# Patient Record
Sex: Female | Born: 1985 | Race: White | Hispanic: No | Smoking: Never smoker
Health system: Southern US, Community
[De-identification: ages and names within clinical notes are randomized; demographics above are authoritative.]

## PROBLEM LIST (undated history)

## (undated) ENCOUNTER — Inpatient Hospital Stay (HOSPITAL_COMMUNITY): Payer: Self-pay

## (undated) ENCOUNTER — Emergency Department (HOSPITAL_BASED_OUTPATIENT_CLINIC_OR_DEPARTMENT_OTHER): Discharge status: Absent without leave | Payer: No Typology Code available for payment source

## (undated) DIAGNOSIS — E88819 Insulin resistance, unspecified: Secondary | ICD-10-CM

## (undated) DIAGNOSIS — J45909 Unspecified asthma, uncomplicated: Secondary | ICD-10-CM

## (undated) DIAGNOSIS — L509 Urticaria, unspecified: Secondary | ICD-10-CM

## (undated) DIAGNOSIS — E8881 Metabolic syndrome: Secondary | ICD-10-CM

## (undated) DIAGNOSIS — I10 Essential (primary) hypertension: Secondary | ICD-10-CM

## (undated) DIAGNOSIS — L309 Dermatitis, unspecified: Secondary | ICD-10-CM

## (undated) DIAGNOSIS — J189 Pneumonia, unspecified organism: Secondary | ICD-10-CM

## (undated) DIAGNOSIS — L659 Nonscarring hair loss, unspecified: Secondary | ICD-10-CM

## (undated) DIAGNOSIS — L80 Vitiligo: Secondary | ICD-10-CM

## (undated) DIAGNOSIS — R091 Pleurisy: Secondary | ICD-10-CM

## (undated) HISTORY — DX: Urticaria, unspecified: L50.9

## (undated) HISTORY — DX: Nonscarring hair loss, unspecified: L65.9

## (undated) HISTORY — DX: Vitiligo: L80

## (undated) HISTORY — DX: Insulin resistance, unspecified: E88.819

## (undated) HISTORY — DX: Metabolic syndrome: E88.81

## (undated) HISTORY — DX: Essential (primary) hypertension: I10

## (undated) HISTORY — DX: Dermatitis, unspecified: L30.9

---

## 2004-03-04 ENCOUNTER — Other Ambulatory Visit: Admission: RE | Admit: 2004-03-04 | Discharge: 2004-03-04 | Payer: Self-pay | Admitting: Gynecology

## 2005-03-10 ENCOUNTER — Other Ambulatory Visit: Admission: RE | Admit: 2005-03-10 | Discharge: 2005-03-10 | Payer: Self-pay | Admitting: Gynecology

## 2006-01-16 ENCOUNTER — Emergency Department (HOSPITAL_COMMUNITY): Admission: EM | Admit: 2006-01-16 | Discharge: 2006-01-17 | Payer: Self-pay | Admitting: Emergency Medicine

## 2006-01-19 ENCOUNTER — Encounter: Admission: RE | Admit: 2006-01-19 | Discharge: 2006-01-19 | Payer: Self-pay | Admitting: Emergency Medicine

## 2006-03-15 ENCOUNTER — Other Ambulatory Visit: Admission: RE | Admit: 2006-03-15 | Discharge: 2006-03-15 | Payer: Self-pay | Admitting: Gynecology

## 2006-11-09 HISTORY — PX: WISDOM TOOTH EXTRACTION: SHX21

## 2007-03-24 ENCOUNTER — Other Ambulatory Visit: Admission: RE | Admit: 2007-03-24 | Discharge: 2007-03-24 | Payer: Self-pay | Admitting: Gynecology

## 2007-11-26 ENCOUNTER — Emergency Department (HOSPITAL_COMMUNITY): Admission: EM | Admit: 2007-11-26 | Discharge: 2007-11-26 | Payer: Self-pay | Admitting: Emergency Medicine

## 2011-07-30 LAB — I-STAT 8, (EC8 V) (CONVERTED LAB)
BUN: 18
Sodium: 138
pCO2, Ven: 45.9
pH, Ven: 7.364 — ABNORMAL HIGH

## 2012-06-18 ENCOUNTER — Emergency Department (HOSPITAL_COMMUNITY)
Admission: EM | Admit: 2012-06-18 | Discharge: 2012-06-18 | Disposition: A | Discharge status: Home / Self Care | Payer: 59 | Source: Home / Self Care | Attending: Emergency Medicine | Admitting: Emergency Medicine

## 2012-06-18 ENCOUNTER — Emergency Department (INDEPENDENT_AMBULATORY_CARE_PROVIDER_SITE_OTHER): Payer: 59

## 2012-06-18 ENCOUNTER — Encounter (HOSPITAL_COMMUNITY): Payer: Self-pay | Admitting: *Deleted

## 2012-06-18 DIAGNOSIS — R062 Wheezing: Secondary | ICD-10-CM

## 2012-06-18 DIAGNOSIS — M546 Pain in thoracic spine: Secondary | ICD-10-CM

## 2012-06-18 HISTORY — DX: Pleurisy: R09.1

## 2012-06-18 HISTORY — DX: Pneumonia, unspecified organism: J18.9

## 2012-06-18 HISTORY — DX: Unspecified asthma, uncomplicated: J45.909

## 2012-06-18 LAB — POCT PREGNANCY, URINE: Preg Test, Ur: NEGATIVE

## 2012-06-18 LAB — POCT URINALYSIS DIP (DEVICE)
Glucose, UA: NEGATIVE mg/dL
Nitrite: NEGATIVE
Protein, ur: NEGATIVE mg/dL
Urobilinogen, UA: 0.2 mg/dL (ref 0.0–1.0)

## 2012-06-18 MED ORDER — ALBUTEROL SULFATE HFA 108 (90 BASE) MCG/ACT IN AERS: 1.0000 | INHALATION_SPRAY | Freq: Four times a day (QID) | RESPIRATORY_TRACT | Status: DC | PRN

## 2012-06-18 MED ORDER — HYDROCODONE-ACETAMINOPHEN 5-325 MG PO TABS: 2.0000 | ORAL_TABLET | ORAL | Status: AC | PRN

## 2012-06-18 MED ORDER — MELOXICAM 15 MG PO TABS: 15.0000 mg | ORAL_TABLET | Freq: Every day | ORAL | Status: AC

## 2012-06-18 MED ORDER — PREDNISONE (PAK) 10 MG PO TABS: ORAL_TABLET | ORAL | Status: AC

## 2012-06-18 NOTE — ED Provider Notes (Signed)
History     CSN: [id number]  Arrival date & time 06/18/12  1704   First MD Initiated Contact with Patient 06/18/12 1749      Chief Complaint  Patient presents with  . Back Pain    (Consider location/radiation/quality/duration/timing/severity/associated sxs/prior treatment) HPI Comments: Patient reports right-sided posterior mid thoracic back pain described as pins and needles, which is worse with standing for prolonged period of time, especially after a 12 hour shift. 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 No coughing, wheezing, chest pain, shortness of breath, fevers, hemoptysis. No pleuritic chest pain. No rash. No other back pain. No extremity weakness, paresthesias. No abdominal pain, urinary complaints. No recent or remote history of trauma to her back. She has a history of asthma as a child, atypical pneumonia x2. She is not a smoker.  ROS as noted in HPI. All other ROS negative.   Patient is a 26 y.o. female presenting with back pain. The history is provided by the patient. No language interpreter was used.  Back Pain  This is a new problem. The current episode started more than 1 week ago. The problem occurs daily. The problem has been gradually worsening. The pain is present in the thoracic spine. The quality of the pain is described as burning. The pain does not radiate. The symptoms are aggravated by bending and twisting. The pain is worse during the day. Associated symptoms include paresthesias. Pertinent negatives include no chest pain, no fever, no paresis and no weakness. She has tried NSAIDs for the symptoms. The treatment provided mild relief. Risk factors include obesity.    Past Medical History  Diagnosis Date  . Asthma   . PNA (pneumonia)   . Pleurisy     History reviewed. No pertinent past surgical history.  Family History  Problem Relation Age of Onset  .  Hypertension Mother   . Diabetes Father   . Hypertension Father   . Coronary artery disease Father     History  Substance Use Topics  . Smoking status: Never Smoker   . Smokeless tobacco: Not on file  . Alcohol Use: No    OB History    Grav Para Term Preterm Abortions TAB SAB Ect Mult Living                  Review of Systems  Constitutional: Negative for fever.  Cardiovascular: Negative for chest pain.  Musculoskeletal: Positive for back pain.  Neurological: Positive for paresthesias. Negative for weakness.    Allergies  Keflex  Home Medications   Current Outpatient Rx  Name Route Sig Dispense Refill  . BIOTIN PO Oral Take by mouth.    . MULTI-VITAMIN/MINERALS PO TABS Oral Take 1 tablet by mouth daily.    Marland Kitchen NORGESTIM-ETH ESTRAD TRIPHASIC 0.18/0.215/0.25 MG-25 MCG PO TABS Oral Take 1 tablet by mouth daily.    . ALBUTEROL SULFATE HFA 108 (90 BASE) MCG/ACT IN AERS Inhalation Inhale 1-2 puffs into the lungs every 6 (six) hours as needed for wheezing. Dispense with aerochamber 1 Inhaler 0  . HYDROCODONE-ACETAMINOPHEN 5-325 MG PO TABS Oral Take 2 tablets by mouth every 4 (four) hours as needed for pain. 20 tablet 0  . MELOXICAM 15 MG PO TABS Oral Take 1 tablet (15 mg total) by mouth daily. 14 tablet 0  . PREDNISONE (PAK) 10 MG PO TABS  Dispense one  6 day pack. Take as directed with food. 21 tablet 0    BP 135/84  Pulse 90  Temp 98.3 F (36.8 C) (Oral)  Resp 18  SpO2 98%  LMP 06/05/2012  Physical Exam  Nursing note and vitals reviewed. Constitutional: She is oriented to person, place, and time. She appears well-developed and well-nourished. No distress.  HENT:  Head: Normocephalic and atraumatic.  Eyes: Conjunctivae and EOM are normal.  Neck: Normal range of motion.  Cardiovascular: Normal rate.   Pulmonary/Chest: Effort normal. No respiratory distress. She has wheezes. She has no rales. She exhibits no tenderness.       Wheezing left side, does not clear with  coughing.   Abdominal: She exhibits no distension.  Musculoskeletal: Normal range of motion.  Neurological: She is alert and oriented to person, place, and time. Coordination normal.  Skin: Skin is warm and dry. No rash noted.  Psychiatric: She has a normal mood and affect. Her behavior is normal. Judgment and thought content normal.    ED Course  Procedures (including critical care time)  Labs Reviewed  POCT URINALYSIS DIP (DEVICE) - Abnormal; Notable for the following:    Ketones, ur TRACE (*)     Hgb urine dipstick MODERATE (*)     All other components within normal limits  POCT PREGNANCY, URINE   Dg Chest 2 View  06/18/2012  *RADIOLOGY REPORT*  Clinical Data: Wheezing left side  CHEST - 2 VIEW  Comparison: CT 11/26/2007  Findings: Normal mediastinum and cardiac silhouette.  Normal pulmonary  vasculature.  No evidence of effusion, infiltrate, or pneumothorax.  No acute bony abnormality.  IMPRESSION:    Normal chest radiograph.  Original Report Authenticated By: Genevive Bi, M.D.   Dg Thoracic Spine 2 View  06/18/2012  *RADIOLOGY REPORT*  Clinical Data: Back pain  THORACIC SPINE - 2 VIEW  Comparison: CT thorax 11/26/2007  Findings: Normal alignment of the thoracic vertebral bodies.  No loss vertebral body height or disc height.  Normal paraspinal lines.  IMPRESSION: Normal thoracic spine.  Original Report Authenticated By: Genevive Bi, M.D.     1. Wheezing   2. Thoracic back pain     MDM  Previous charts reviewed. h/o PNA pleuritic CP.  The patient is describing pain consistent with a pinched thoracic nerve on the right side. no rash. Checking T spine xr but discussed that she may need advanced imaging to identify the cause of her symptoms. She does however have wheezing on the left side. Checking x-ray to rule out pneumonia as well. States that she has been asymptomatic with previous pneumonias.  Imaging reviewed by myself. No pneumonias. Full report per  radiology.  Udip noted. Doubt a kidney stone given patient's presentation. Has no abdominal pain, urinary complaints. Will have her follow this up with primary care physician of her choice.  Discussed imaging, MDM with patient. Will start on albuterol for wheezing, steroids, meloxicam further back pain. Steroids will also help with any pulmonary inflammation that she has. Norco for severe pain. Referring her to Dr. Ave Filter, orthopedist on call. Discussed signs and symptoms that should prompt return to the department. Patient agrees with plan.   Luiz Blare, MD 06/18/12 [phone]

## 2012-06-18 NOTE — ED Notes (Signed)
Pt with c/o back pain onset x 6 months - worse after working 12 hour shift - mid back - pinprick tingling feeling -

## 2012-06-21 ENCOUNTER — Other Ambulatory Visit (HOSPITAL_COMMUNITY): Payer: Self-pay | Admitting: Orthopedic Surgery

## 2012-06-21 DIAGNOSIS — M546 Pain in thoracic spine: Secondary | ICD-10-CM

## 2012-06-22 ENCOUNTER — Ambulatory Visit (HOSPITAL_COMMUNITY)
Admission: RE | Admit: 2012-06-22 | Discharge: 2012-06-22 | Disposition: A | Discharge status: Home / Self Care | Payer: 59 | Source: Ambulatory Visit | Attending: Orthopedic Surgery | Admitting: Orthopedic Surgery

## 2012-06-22 DIAGNOSIS — M546 Pain in thoracic spine: Secondary | ICD-10-CM | POA: Insufficient documentation

## 2013-05-18 LAB — HM PAP SMEAR

## 2013-08-29 ENCOUNTER — Emergency Department (HOSPITAL_COMMUNITY)
Admission: EM | Admit: 2013-08-29 | Discharge: 2013-08-29 | Disposition: A | Discharge status: Home / Self Care | Payer: 59 | Source: Home / Self Care | Attending: Family Medicine | Admitting: Family Medicine

## 2013-08-29 ENCOUNTER — Encounter (HOSPITAL_COMMUNITY): Payer: Self-pay | Admitting: Emergency Medicine

## 2013-08-29 DIAGNOSIS — R030 Elevated blood-pressure reading, without diagnosis of hypertension: Secondary | ICD-10-CM

## 2013-08-29 MED ORDER — HYDROCHLOROTHIAZIDE 12.5 MG PO TABS: 12.5000 mg | ORAL_TABLET | Freq: Every day | ORAL | Status: DC

## 2013-08-29 NOTE — ED Provider Notes (Signed)
Kayla Campos is a 27 y.o. female who presents to Urgent Care today for hypertension. Patient has had elevated BP in the past few days. She notes a mild headache and checked her BP after taking ibuprofen and found it to be elevated. Her maximum blood pressure was 150/96. She denies any chest pains palpitations or syncope. She feels well otherwise. She has had normal blood pressure readings in the past.    Past Medical History  Diagnosis Date  . Asthma   . PNA (pneumonia)   . Pleurisy    History  Substance Use Topics  . Smoking status: Never Smoker   . Smokeless tobacco: Not on file  . Alcohol Use: No   ROS as above Medications reviewed. No current facility-administered medications for this encounter.   Current Outpatient Prescriptions  Medication Sig Dispense Refill  . albuterol (PROVENTIL HFA;VENTOLIN HFA) 108 (90 BASE) MCG/ACT inhaler Inhale 1-2 puffs into the lungs every 6 (six) hours as needed for wheezing. Dispense with aerochamber  1 Inhaler  0  . BIOTIN PO Take by mouth.      . hydrochlorothiazide (HYDRODIURIL) 12.5 MG tablet Take 1 tablet (12.5 mg total) by mouth daily.  30 tablet  1  . Multiple Vitamins-Minerals (MULTIVITAMIN WITH MINERALS) tablet Take 1 tablet by mouth daily.      . Norgestimate-Ethinyl Estradiol Triphasic (ORTHO TRI-CYCLEN LO) 0.18/0.215/0.25 MG-25 MCG tablet Take 1 tablet by mouth daily.        Exam:  BP 142/96  Pulse 86  Temp(Src) 98.2 F (36.8 C) (Oral)  Resp 16  SpO2 99%  LMP 08/27/2013 Gen: Well NAD HEENT: EOMI,  MMM Lungs: CTABL Nl WOB Heart: RRR no MRG Abd: NABS, NT, ND Exts: Non edematous BL  LE, warm and well perfused.   No results found for this or any previous visit (from the past 24 hour(s)). No results found.  Assessment and Plan: 27 y.o. female with elevated blood pressure. Not yet diagnosed with hypertension. Hypertension is very likely based on continued elevated blood pressure here in the office and at work. Plan to  start low-dose hydrochlorothiazide 12.5 mg daily. Patient will keep a blood pressure log and follow up with primary care provider. Discussed warning signs or symptoms. Please see discharge instructions. Patient expresses understanding.      Rodolph Bong, MD 08/29/13 [phone]

## 2013-08-29 NOTE — ED Notes (Signed)
C/o high blood pressure which was noticed today only after patient had two nosebleeds.  Patient states she does have a headache.

## 2013-09-06 ENCOUNTER — Ambulatory Visit (INDEPENDENT_AMBULATORY_CARE_PROVIDER_SITE_OTHER): Payer: 59 | Admitting: Physician Assistant

## 2013-09-06 ENCOUNTER — Encounter: Payer: Self-pay | Admitting: Physician Assistant

## 2013-09-06 VITALS — BP 144/80 | HR 76 | Wt 269.0 lb

## 2013-09-06 DIAGNOSIS — R635 Abnormal weight gain: Secondary | ICD-10-CM

## 2013-09-06 DIAGNOSIS — R42 Dizziness and giddiness: Secondary | ICD-10-CM

## 2013-09-06 DIAGNOSIS — Z131 Encounter for screening for diabetes mellitus: Secondary | ICD-10-CM

## 2013-09-06 DIAGNOSIS — I10 Essential (primary) hypertension: Secondary | ICD-10-CM

## 2013-09-06 DIAGNOSIS — Z13220 Encounter for screening for lipoid disorders: Secondary | ICD-10-CM

## 2013-09-06 LAB — COMPLETE METABOLIC PANEL WITH GFR
ALT: 15 U/L (ref 0–35)
Alkaline Phosphatase: 43 U/L (ref 39–117)
BUN: 14 mg/dL (ref 6–23)
CO2: 23 mEq/L (ref 19–32)
Chloride: 102 mEq/L (ref 96–112)
GFR, Est African American: >89 mL/min
GFR, Est Non African American: >89 mL/min
Glucose, Bld: 95 mg/dL (ref 70–99)
Total Bilirubin: 0.3 mg/dL (ref 0.3–1.2)

## 2013-09-06 LAB — LIPID PANEL
HDL: 61 mg/dL (ref >39)
LDL Cholesterol: 93 mg/dL (ref 0–99)
Total CHOL/HDL Ratio: 3 Ratio
Triglycerides: 131 mg/dL (ref <150)

## 2013-09-06 MED ORDER — LISINOPRIL-HYDROCHLOROTHIAZIDE 10-12.5 MG PO TABS: 1.0000 | ORAL_TABLET | Freq: Every day | ORAL | Status: DC

## 2013-09-08 NOTE — Progress Notes (Signed)
  Subjective:    Patient ID: Kayla Campos, female    DOB: February 08, 1986, 27 y.o.   MRN: [id number]  HPI Patient is a 27 yo female who presents to the clinic to establish care. PMH negative except the recent dx of Hypertension and HCTZ that was started. Pt had been getting bad headaches and that is why she went to UC. Since HCTZ started she has been very dizzy and feels weak. She has had some nausea but no vomiting. Home pregnancy test was negative. She denies any CP, palpitations, vision changes. Denies any fever, chills, ST, ear pain, sinus pressure. Pt does not smoke or drink.   Abnormal weight gain- pt started working 3rd shift and put on a lot of extra weight. She admits to a fast food diet and no exercise. She wants to make sure thyroid is not messed up.   Tdap up to date.  Flu shot up to date. Pap smear 7/14.      Review of Systems     Objective:   Physical Exam  Constitutional: She is oriented to person, place, and time. She appears well-developed and well-nourished.  Obese.   HENT:  Head: Normocephalic and atraumatic.  Cardiovascular: Normal rate, regular rhythm and normal heart sounds.   Pulmonary/Chest: Effort normal and breath sounds normal.  Neurological: She is alert and oriented to person, place, and time.  Skin: Skin is warm and dry.  Psychiatric: She has a normal mood and affect. Her behavior is normal.          Assessment & Plan:  HTN- stop HCTZ. Start lisinopril/HCTZ daily.I think just HCTZ might be causing electrolyte imbalance or dizziness. Not controlling BP therefore will switch. Will get CMP today.   Dizziness/nausea- Home pregnancy negative. i think SE of HCTZ alone. Offered zofran and declined today. Follow up if not improving.   Abnormal weight gain- Talked briefly about diet changes, exercise, and sleep. Will check TSH. Will make a plan at next visit. Consider nutritionist.   Lab slip given for screening labs. Follow up for CPE.

## 2013-10-04 ENCOUNTER — Encounter: Payer: 59 | Admitting: Physician Assistant

## 2013-10-11 ENCOUNTER — Encounter: Payer: Self-pay | Admitting: Physician Assistant

## 2013-10-11 ENCOUNTER — Ambulatory Visit (INDEPENDENT_AMBULATORY_CARE_PROVIDER_SITE_OTHER): Payer: 59 | Admitting: Physician Assistant

## 2013-10-11 VITALS — BP 123/70 | HR 81 | Ht 66.0 in | Wt 268.0 lb

## 2013-10-11 DIAGNOSIS — L858 Other specified epidermal thickening: Secondary | ICD-10-CM

## 2013-10-11 DIAGNOSIS — Q828 Other specified congenital malformations of skin: Secondary | ICD-10-CM

## 2013-10-11 DIAGNOSIS — R635 Abnormal weight gain: Secondary | ICD-10-CM

## 2013-10-11 DIAGNOSIS — Z6841 Body Mass Index (BMI) 40.0 and over, adult: Secondary | ICD-10-CM | POA: Insufficient documentation

## 2013-10-11 DIAGNOSIS — Z0000 Encounter for general adult medical examination without abnormal findings: Secondary | ICD-10-CM

## 2013-10-11 MED ORDER — LISINOPRIL-HYDROCHLOROTHIAZIDE 10-12.5 MG PO TABS: 1.0000 | ORAL_TABLET | Freq: Every day | ORAL | Status: DC

## 2013-10-11 MED ORDER — PHENTERMINE HCL 37.5 MG PO CAPS: 37.5000 mg | ORAL_CAPSULE | ORAL | Status: DC

## 2013-10-11 NOTE — Patient Instructions (Addendum)
Lac-hydrin OTC for keratosis pilaris.   Keeping You Healthy  Get These Tests 1. Blood Pressure- Have your blood pressure checked once a year by your health care provider.  Normal blood pressure is 120/80. 2. Weight- Have your body mass index (BMI) calculated to screen for obesity.  BMI is measure of body fat based on height and weight.  You can also calculate your own BMI at https://www.west-esparza.com/. 3. Cholesterol- Have your cholesterol checked every 5 years starting at age 42 then yearly starting at age 55. 4. Chlamydia, HIV, and other sexually transmitted diseases- Get screened every year until age 36, then within three months of each new sexual provider. 5. Pap Smear- Every 1-3 years; discuss with your health care provider. 6. Mammogram- Every year starting at age 67  Take these medicines  Calcium with Vitamin D-Your body needs 1200 mg of Calcium each day and [id number] IU of Vitamin D daily.  Your body can only absorb 500 mg of Calcium at a time so Calcium must be taken in 2 or 3 divided doses throughout the day.  Multivitamin with folic acid- Once daily if it is possible for you to become pregnant.  Get these Immunizations  Gardasil-Series of three doses; prevents HPV related illness such as genital warts and cervical cancer.  Menactra-Single dose; prevents meningitis.  Tetanus shot- Every 10 years.  Flu shot-Every year.  Take these steps 1. Do not smoke-Your healthcare provider can help you quit.  For tips on how to quit go to www.smokefree.gov or call 1-800 QUITNOW. 2. Be physically active- Exercise 5 days a week for at least 30 minutes.  If you are not already physically active, start slow and gradually work up to 30 minutes of moderate physical activity.  Examples of moderate activity include walking briskly, dancing, swimming, bicycling, etc. 3. Breast Cancer- A self breast exam every month is important for early detection of breast cancer.  For more information and  instruction on self breast exams, ask your healthcare provider or [place].es. 4. Eat a healthy diet- Eat a variety of healthy foods such as fruits, vegetables, whole grains, low fat milk, low fat cheeses, yogurt, lean meats, poultry and fish, beans, nuts, tofu, etc.  For more information go to www. Thenutritionsource.org 5. Drink alcohol in moderation- Limit alcohol intake to one drink or less per day. Never drink and drive. 6. Depression- Your emotional health is as important as your physical health.  If you're feeling down or losing interest in things you normally enjoy please talk to your healthcare provider about being screened for depression. 7. Dental visit- Brush and floss your teeth twice daily; visit your dentist twice a year. 8. Eye doctor- Get an eye exam at least every 2 years. 9. Helmet use- Always wear a helmet when riding a bicycle, motorcycle, rollerblading or skateboarding. 10. Safe sex- If you may be exposed to sexually transmitted infections, use a condom. 11. Seat belts- Seat belts can save your live; always wear one. 12. Smoke/Carbon Monoxide detectors- These detectors need to be installed on the appropriate level of your home. Replace batteries at least once a year. 13. Skin cancer- When out in the sun please cover up and use sunscreen 15 SPF or higher. 14. Violence- If anyone is threatening or hurting you, please tell your healthcare provider.

## 2013-10-11 NOTE — Progress Notes (Addendum)
   Subjective:     Kayla Campos is a 27 y.o. female and is here for a comprehensive physical exam. The patient reports no problems.  History   Social History  . Marital Status: Single    Spouse Name: N/A    Number of Children: N/A  . Years of Education: N/A   Occupational History  . Not on file.   Social History Main Topics  . Smoking status: Never Smoker   . Smokeless tobacco: Not on file  . Alcohol Use: No  . Drug Use: No  . Sexual Activity: Yes   Other Topics Concern  . Not on file   Social History Narrative  . No narrative on file   Health Maintenance  Topic Date Due  . Influenza Vaccine  06/09/2014  . Pap Smear  05/15/2016  . Tetanus/tdap  02/20/2023    The following portions of the patient's history were reviewed and updated as appropriate: allergies, current medications, past family history, past medical history, past social history, past surgical history and problem list.  Review of Systems A comprehensive review of systems was negative.   Objective:    BP 123/70  Pulse 81  Wt 268 lb (121.564 kg) General appearance: alert, cooperative and appears stated age Head: Normocephalic, without obvious abnormality, atraumatic Eyes: conjunctivae/corneas clear. PERRL, EOM's intact. Fundi benign. Ears: normal TM's and external ear canals both ears Nose: Nares normal. Septum midline. Mucosa normal. No drainage or sinus tenderness. Throat: lips, mucosa, and tongue normal; teeth and gums normal Neck: no adenopathy, no carotid bruit, no JVD, supple, symmetrical, trachea midline and thyroid not enlarged, symmetric, no tenderness/mass/nodules Back: symmetric, no curvature. ROM normal. No CVA tenderness. Lungs: clear to auscultation bilaterally Heart: regular rate and rhythm, S1, S2 normal, no murmur, click, rub or gallop Abdomen: soft, non-tender; bowel sounds normal; no masses,  no organomegaly Extremities: extremities normal, atraumatic, no cyanosis or  edema Pulses: 2+ and symmetric Skin: Skin color, texture, turgor normal. No rashes or lesions or fine papules on a erythematous base on bilateral arms. Lymph nodes: Cervical, supraclavicular, and axillary nodes normal. Neurologic: Grossly normal    Assessment:    Healthy female exam.      Plan:    CPE- Pap up to date. Vaccines up to date. Fasting labs were previously done and they tested. Discuss regular exercise and multivitamins of calcium and vitamin D.Depression screening was negative.   htn- blood pressure looks great today will refill medication for 6 months.  BMI 43/abnormal weight gain-since blood pressure looks good will give phentermine for one month. Patient is to followup in one month. Discussed side effects of insomnia, palpitations and she is to have these she will stop the medication. Encouraged patient to truly start managing her weight.  Keratosis Pilaris- told patient to get lac-Hydrin over-the-counter. See After Visit Summary for Counseling Recommendations

## 2014-04-11 ENCOUNTER — Ambulatory Visit (INDEPENDENT_AMBULATORY_CARE_PROVIDER_SITE_OTHER): Payer: 59 | Admitting: Physician Assistant

## 2014-04-11 ENCOUNTER — Encounter: Payer: Self-pay | Admitting: Physician Assistant

## 2014-04-11 VITALS — BP 125/75 | HR 83 | Ht 66.0 in | Wt 267.0 lb

## 2014-04-11 DIAGNOSIS — R110 Nausea: Secondary | ICD-10-CM

## 2014-04-11 DIAGNOSIS — R141 Gas pain: Secondary | ICD-10-CM

## 2014-04-11 DIAGNOSIS — R142 Eructation: Secondary | ICD-10-CM

## 2014-04-11 DIAGNOSIS — R143 Flatulence: Secondary | ICD-10-CM

## 2014-04-11 DIAGNOSIS — R140 Abdominal distension (gaseous): Secondary | ICD-10-CM

## 2014-04-11 MED ORDER — ONDANSETRON HCL 4 MG PO TABS: 4.0000 mg | ORAL_TABLET | Freq: Three times a day (TID) | ORAL | Status: DC | PRN

## 2014-04-11 MED ORDER — OMEPRAZOLE 40 MG PO CPDR: 40.0000 mg | DELAYED_RELEASE_CAPSULE | Freq: Every day | ORAL | Status: DC

## 2014-04-11 NOTE — Progress Notes (Signed)
   Subjective:    Patient ID: Kayla Campos, female    DOB: 09/13/1986, 28 y.o.   MRN: [id number]  HPI Patient is a 28 year old female who presents to the clinic with 2-3 months of bloating, nausea, burping after eating. She does not feel like she is over any. She can even a half of what she normally eats and still will feel bloated. It is not after every female. It usually is after decreasing meals. Has started coming after even vegetables sleep. She denies any abdominal pain. Her bowel movements are soft in every other day. She denies any lower abdominal pressure or change in menstrual cycle. She does become very nauseated but denies any vomiting. She doesn't note that this like a gurgling in the back of her throat sometimes after eating. She denies any fever, chills.    Review of Systems  All other systems reviewed and are negative.      Objective:   Physical Exam  Constitutional: She is oriented to person, place, and time. She appears well-developed and well-nourished.  HENT:  Head: Normocephalic and atraumatic.  Cardiovascular: Normal rate, regular rhythm and normal heart sounds.   Pulmonary/Chest: Effort normal and breath sounds normal.  Abdominal: Soft. Bowel sounds are normal. She exhibits no distension and no mass. There is no tenderness. There is no rebound and no guarding.  Neurological: She is alert and oriented to person, place, and time.  Skin: Skin is dry.  Psychiatric: She has a normal mood and affect. Her behavior is normal.          Assessment & Plan:   Bloating /nausea /burping - with no evidence of abdominal pain or tenderness on exam gallbladder disease is less likely. Will hold off on right upper quadrant ultrasound. I do feel like acid reflux is a possibility. Started omeprazole 40 mg in the morning before breakfast. I also encouraged patient to start an over-the-counter probiotic daily. If not improving in the next 4-6 weeks her symptoms worsen in place  followup. With these vague symptoms of chest: He has ovarian cancer on the differential for future. We can certainly get a pelvic ultrasound and or right upper quadrant ultrasound if symptoms worsen or continue. There is also possibility for IBS symptoms. Discussed with patient he can be triggered by stress and anxiety. She does admit to lots of stress and anxiety while planning her wedding. Gave her handout for IBS and diet. Encouraged her to keep a food diary. Pregnancy was discussed pt just finished menstrual cycle and taking OCP daily.

## 2014-04-11 NOTE — Patient Instructions (Signed)
Start omeprazole daily in the morning before breakfast.  Probiotic daily.   Follow up in 4-6 weeks.   Diet and Irritable Bowel Syndrome  No cure has been found for irritable bowel syndrome (IBS). Many options are available to treat the symptoms. Your caregiver will give you the best treatments available for your symptoms. He or she will also encourage you to manage stress and to make changes to your diet. You need to work with your caregiver and Registered Dietician to find the best combination of medicine, diet, counseling, and support to control your symptoms. The following are some diet suggestions. FOODS THAT MAKE IBS WORSE  Fatty foods, such as [place] fries.  Milk products, such as cheese or ice cream.  Chocolate.  Alcohol.  Caffeine (found in coffee and some sodas).  Carbonated drinks, such as soda. If certain foods cause symptoms, you should eat less of them or stop eating them. FOOD JOURNAL   Keep a journal of the foods that seem to cause distress. Write down:  What you are eating during the day and when.  What problems you are having after eating.  When the symptoms occur in relation to your meals.  What foods always make you feel badly.  Take your notes with you to your caregiver to see if you should stop eating certain foods. FOODS THAT MAKE IBS BETTER Fiber reduces IBS symptoms, especially constipation, because it makes stools soft, bulky, and easier to pass. Fiber is found in bran, bread, cereal, beans, fruit, and vegetables. Examples of foods with fiber include:  Apples.  Peaches.  Pears.  Berries.  Figs.  Broccoli, raw.  Cabbage.  Carrots.  Raw peas.  Kidney beans.  Lima beans.  Whole-grain bread.  Whole-grain cereal. Add foods with fiber to your diet a little at a time. This will let your body get used to them. Too much fiber at once might cause gas and swelling of your abdomen. This can trigger symptoms in a person with IBS. Caregivers  usually recommend a diet with enough fiber to produce soft, painless bowel movements. High fiber diets may cause gas and bloating. However, these symptoms often go away within a few weeks, as your body adjusts. In many cases, dietary fiber may lessen IBS symptoms, particularly constipation. However, it may not help pain or diarrhea. High fiber diets keep the colon mildly enlarged (distended) with the added fiber. This may help prevent spasms in the colon. Some forms of fiber also keep water in the stool, thereby preventing hard stools that are difficult to pass.  Besides telling you to eat more foods with fiber, your caregiver may also tell you to get more fiber by taking a fiber pill or drinking water mixed with a special high fiber powder. An example of this is a natural fiber laxative containing psyllium seed.  TIPS  Large meals can cause cramping and diarrhea in people with IBS. If this happens to you, try eating 4 or 5 small meals a day, or try eating less at each of your usual 3 meals. It may also help if your meals are low in fat and high in carbohydrates. Examples of carbohydrates are pasta, rice, whole-grain breads and cereals, fruits, and vegetables.  If dairy products cause your symptoms to flare up, you can try eating less of those foods. You might be able to handle yogurt better than other dairy products, because it contains bacteria that helps with digestion. Dairy products are an important source of calcium and other  nutrients. If you need to avoid dairy products, be sure to talk with a Registered Dietitian about getting these nutrients through other food sources.  Drink enough water and fluids to keep your urine clear or pale yellow. This is important, especially if you have diarrhea. FOR MORE INFORMATION  International Foundation for Functional Gastrointestinal Disorders: www.iffgd.org  National Digestive Diseases Information Clearinghouse: digestive.AmenCredit.is Document Released:  01/16/2004 Document Revised: 01/18/2012 Document Reviewed: 10/03/2007 [facility] Patient Information 2014 [place], [place].

## 2014-05-23 ENCOUNTER — Ambulatory Visit: Payer: 59 | Admitting: Physician Assistant

## 2014-05-24 ENCOUNTER — Encounter: Payer: Self-pay | Admitting: Physician Assistant

## 2014-05-24 ENCOUNTER — Ambulatory Visit (INDEPENDENT_AMBULATORY_CARE_PROVIDER_SITE_OTHER): Payer: 59 | Admitting: Physician Assistant

## 2014-05-24 VITALS — BP 122/72 | HR 80 | Ht 66.0 in | Wt 272.0 lb

## 2014-05-24 DIAGNOSIS — Z6841 Body Mass Index (BMI) 40.0 and over, adult: Secondary | ICD-10-CM

## 2014-05-24 DIAGNOSIS — R142 Eructation: Secondary | ICD-10-CM

## 2014-05-24 DIAGNOSIS — K219 Gastro-esophageal reflux disease without esophagitis: Secondary | ICD-10-CM

## 2014-05-24 DIAGNOSIS — R110 Nausea: Secondary | ICD-10-CM

## 2014-05-24 DIAGNOSIS — S7001XS Contusion of right hip, sequela: Secondary | ICD-10-CM

## 2014-05-24 DIAGNOSIS — R141 Gas pain: Secondary | ICD-10-CM

## 2014-05-24 DIAGNOSIS — R140 Abdominal distension (gaseous): Secondary | ICD-10-CM

## 2014-05-24 DIAGNOSIS — I10 Essential (primary) hypertension: Secondary | ICD-10-CM

## 2014-05-24 DIAGNOSIS — R143 Flatulence: Secondary | ICD-10-CM

## 2014-05-24 MED ORDER — FLUCONAZOLE 150 MG PO TABS: 150.0000 mg | ORAL_TABLET | Freq: Once | ORAL | Status: DC

## 2014-05-24 MED ORDER — ONDANSETRON HCL 4 MG PO TABS: 4.0000 mg | ORAL_TABLET | Freq: Three times a day (TID) | ORAL | Status: DC | PRN
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
## 2014-05-24 NOTE — Patient Instructions (Signed)
Increase prilosec to 40mg  twice a day.  Will get gallbladder ultrasound.

## 2014-05-25 ENCOUNTER — Other Ambulatory Visit: Payer: Self-pay | Admitting: Physician Assistant

## 2014-05-25 DIAGNOSIS — R110 Nausea: Secondary | ICD-10-CM | POA: Insufficient documentation

## 2014-05-25 DIAGNOSIS — R140 Abdominal distension (gaseous): Secondary | ICD-10-CM

## 2014-05-25 DIAGNOSIS — K219 Gastro-esophageal reflux disease without esophagitis: Secondary | ICD-10-CM | POA: Insufficient documentation

## 2014-05-25 DIAGNOSIS — I10 Essential (primary) hypertension: Secondary | ICD-10-CM | POA: Insufficient documentation

## 2014-05-30 ENCOUNTER — Ambulatory Visit (INDEPENDENT_AMBULATORY_CARE_PROVIDER_SITE_OTHER): Payer: 59

## 2014-05-30 DIAGNOSIS — K7689 Other specified diseases of liver: Secondary | ICD-10-CM

## 2014-06-03 ENCOUNTER — Encounter: Payer: Self-pay | Admitting: Physician Assistant

## 2014-06-03 DIAGNOSIS — K760 Fatty (change of) liver, not elsewhere classified: Secondary | ICD-10-CM | POA: Insufficient documentation

## 2014-07-23 ENCOUNTER — Telehealth: Payer: Self-pay | Admitting: Physician Assistant

## 2014-07-23 ENCOUNTER — Encounter: Payer: Self-pay | Admitting: Physician Assistant

## 2014-07-23 ENCOUNTER — Other Ambulatory Visit: Payer: Self-pay | Admitting: Physician Assistant

## 2014-07-23 ENCOUNTER — Other Ambulatory Visit: Payer: Self-pay | Admitting: *Deleted

## 2014-07-23 MED ORDER — LISINOPRIL-HYDROCHLOROTHIAZIDE 10-12.5 MG PO TABS: 1.0000 | ORAL_TABLET | Freq: Every day | ORAL | Status: DC

## 2014-07-23 NOTE — Telephone Encounter (Signed)
Kayla Campos. She is requesting refill on bp meds. Please call her at [phone] Thank you. Marland Kitchen

## 2014-07-23 NOTE — Telephone Encounter (Signed)
Lisinopril refilled to [facility].  Pt notified.

## 2014-07-30 ENCOUNTER — Ambulatory Visit (INDEPENDENT_AMBULATORY_CARE_PROVIDER_SITE_OTHER): Payer: 59 | Admitting: Physician Assistant

## 2014-07-30 MED ORDER — PHENTERMINE HCL 37.5 MG PO CAPS: 37.5000 mg | ORAL_CAPSULE | ORAL | Status: DC

## 2014-07-30 NOTE — Progress Notes (Signed)
   Subjective:    Patient ID: Kayla Campos, female    DOB: 12-12-1985, 28 y.o.   MRN: [id number]  HPI  Kayla Campos is here for a weight and blood pressure check. Denies medication problems, shortness of breath, chest pains or headaches.   Review of Systems     Objective:   Physical Exam        Assessment & Plan:  Patient has lost 13 lbs since her last weight check. Will fax refill of phentermine to [facility].

## 2014-08-28 ENCOUNTER — Other Ambulatory Visit: Payer: Self-pay | Admitting: Physician Assistant

## 2014-09-03 ENCOUNTER — Ambulatory Visit (INDEPENDENT_AMBULATORY_CARE_PROVIDER_SITE_OTHER): Payer: 59 | Admitting: Physician Assistant

## 2014-09-03 DIAGNOSIS — R635 Abnormal weight gain: Secondary | ICD-10-CM

## 2014-09-03 MED ORDER — PHENTERMINE HCL 37.5 MG PO CAPS: 37.5000 mg | ORAL_CAPSULE | ORAL | Status: DC

## 2014-09-03 MED ORDER — OMEPRAZOLE 40 MG PO CPDR: 40.0000 mg | DELAYED_RELEASE_CAPSULE | Freq: Every day | ORAL | Status: DC

## 2014-09-03 NOTE — Progress Notes (Signed)
   Subjective:    Patient ID: Kayla Campos, female    DOB: Jan 09, 1986, 28 y.o.   MRN: [id number]  HPI  Kayla Campos is here for a weight check and blood pressure check. Denies chest pain, shortness of breath, headaches or medication problems.   Review of Systems     Objective:   Physical Exam        Assessment & Plan:  Patient has lost weight. A prescription will be faxed to [facility].

## 2014-10-03 ENCOUNTER — Ambulatory Visit: Payer: 59

## 2014-12-11 ENCOUNTER — Encounter: Payer: Self-pay | Admitting: Physician Assistant

## 2014-12-11 ENCOUNTER — Ambulatory Visit (INDEPENDENT_AMBULATORY_CARE_PROVIDER_SITE_OTHER): Payer: 59 | Admitting: Physician Assistant

## 2014-12-11 ENCOUNTER — Telehealth: Payer: Self-pay | Admitting: Physician Assistant

## 2014-12-11 VITALS — BP 120/67 | HR 85 | Ht 66.0 in | Wt 227.0 lb

## 2014-12-11 DIAGNOSIS — Z0000 Encounter for general adult medical examination without abnormal findings: Secondary | ICD-10-CM

## 2014-12-11 DIAGNOSIS — L819 Disorder of pigmentation, unspecified: Secondary | ICD-10-CM | POA: Insufficient documentation

## 2014-12-11 DIAGNOSIS — K219 Gastro-esophageal reflux disease without esophagitis: Secondary | ICD-10-CM

## 2014-12-11 DIAGNOSIS — Z6836 Body mass index (BMI) 36.0-36.9, adult: Secondary | ICD-10-CM | POA: Insufficient documentation

## 2014-12-11 DIAGNOSIS — I10 Essential (primary) hypertension: Secondary | ICD-10-CM

## 2014-12-11 DIAGNOSIS — Z131 Encounter for screening for diabetes mellitus: Secondary | ICD-10-CM

## 2014-12-11 LAB — COMPLETE METABOLIC PANEL WITH GFR
ALT: 29 U/L (ref 0–35)
AST: 37 U/L (ref 0–37)
Albumin: 3.9 g/dL (ref 3.5–5.2)
Alkaline Phosphatase: 36 U/L — ABNORMAL LOW (ref 39–117)
BUN: 16 mg/dL (ref 6–23)
CHLORIDE: 103 meq/L (ref 96–112)
CO2: 25 meq/L (ref 19–32)
CREATININE: 0.68 mg/dL (ref 0.50–1.10)
Calcium: 9.5 mg/dL (ref 8.4–10.5)
GFR, Est African American: >89 mL/min
GFR, Est Non African American: >89 mL/min
Glucose, Bld: 84 mg/dL (ref 70–99)
Potassium: 4.3 mEq/L (ref 3.5–5.3)
Sodium: 138 mEq/L (ref 135–145)
Total Bilirubin: 0.3 mg/dL (ref 0.2–1.2)
Total Protein: 7.1 g/dL (ref 6.0–8.3)

## 2014-12-11 MED ORDER — PHENTERMINE HCL 37.5 MG PO TABS: 37.5000 mg | ORAL_TABLET | Freq: Every day | ORAL | Status: DC

## 2014-12-11 MED ORDER — OMEPRAZOLE 40 MG PO CPDR: 40.0000 mg | DELAYED_RELEASE_CAPSULE | Freq: Every day | ORAL | Status: DC

## 2014-12-11 MED ORDER — PHENTERMINE HCL 37.5 MG PO CAPS: 37.5000 mg | ORAL_CAPSULE | ORAL | Status: DC

## 2014-12-11 MED ORDER — LISINOPRIL-HYDROCHLOROTHIAZIDE 10-12.5 MG PO TABS: 1.0000 | ORAL_TABLET | Freq: Every day | ORAL | Status: DC

## 2014-12-11 NOTE — Telephone Encounter (Signed)
Left information on pt's vm.

## 2014-12-11 NOTE — Patient Instructions (Signed)

## 2014-12-11 NOTE — Progress Notes (Signed)
  Subjective:     Kayla Campos is a 29 y.o. female and is here for a comprehensive physical exam. The patient reports no problems.  History   Social History  . Marital Status: Single    Spouse Name: N/A    Number of Children: N/A  . Years of Education: N/A   Occupational History  . Not on file.   Social History Main Topics  . Smoking status: Never Smoker   . Smokeless tobacco: Not on file  . Alcohol Use: No  . Drug Use: No  . Sexual Activity: Yes   Other Topics Concern  . Not on file   Social History Narrative   Health Maintenance  Topic Date Due  . INFLUENZA VACCINE  06/09/2014  . PAP SMEAR  05/15/2016  . TETANUS/TDAP  02/20/2023    The following portions of the patient's history were reviewed and updated as appropriate: allergies, current medications, past family history, past medical history, past social history, past surgical history and problem list.  Review of Systems A comprehensive review of systems was negative.   Objective:    BP 120/67 mmHg  Pulse 85  Ht 5\' 6"  (1.676 m)  Wt 227 lb (102.967 kg)  BMI 36.66 kg/m2 General appearance: alert, cooperative, appears stated age and moderately obese Head: Normocephalic, without obvious abnormality, atraumatic Eyes: conjunctivae/corneas clear. PERRL, EOM's intact. Fundi benign. Ears: normal TM's and external ear canals both ears Nose: Nares normal. Septum midline. Mucosa normal. No drainage or sinus tenderness. Throat: lips, mucosa, and tongue normal; teeth and gums normal Neck: no adenopathy, no carotid bruit, no JVD, supple, symmetrical, trachea midline and thyroid not enlarged, symmetric, no tenderness/mass/nodules Back: symmetric, no curvature. ROM normal. No CVA tenderness. Lungs: clear to auscultation bilaterally Heart: regular rate and rhythm, S1, S2 normal, no murmur, click, rub or gallop Abdomen: soft, non-tender; bowel sounds normal; no masses,  no organomegaly Extremities: extremities  normal, atraumatic, no cyanosis or edema Pulses: 2+ and symmetric Skin: Skin color, texture, turgor normal. No rashes or lesions hyperpigmentation over left check circular approximately 2cm by 2cm.  Lymph nodes: Cervical, supraclavicular, and axillary nodes normal. Neurologic: Grossly normal    Assessment:    Healthy female exam.      Plan:    CPE- lipid was great in 08/2013. Will wait until next year to screen again. Ordered CMP. Vaccines up to date. HO given. Discussed vitamin d and calcium 800 units and 1200mg  of calcium.   Obesity- down from October 2015- now 250-227. Taking phentermine. Exercising 3 days a week. Doing weight watchers and [employer] healthy weight challenge. Keep up good work. Vitals look great. Refilled phentermine. Follow up in 3 months. Discussed needing to start taper in next couple of months.   HTN- doing great. Refilled for 6 months.   GERD- controlled refilled for 1 year.   Hyperpigmentation of skin- can try meladerm OtC. If not improving or wanting more results then need to consider derm referral.  See After Visit Summary for Counseling Recommendations

## 2014-12-11 NOTE — Telephone Encounter (Signed)
Call pt: meladerm OTC try first. Let me know if no improvement then consider derm referral.

## 2015-08-15 ENCOUNTER — Other Ambulatory Visit: Payer: Self-pay | Admitting: Physician Assistant

## 2015-09-18 ENCOUNTER — Other Ambulatory Visit: Payer: Self-pay

## 2015-09-18 MED ORDER — LISINOPRIL-HYDROCHLOROTHIAZIDE 10-12.5 MG PO TABS: 1.0000 | ORAL_TABLET | Freq: Every day | ORAL | Status: DC

## 2015-10-08 ENCOUNTER — Ambulatory Visit: Payer: 59 | Admitting: Physician Assistant

## 2015-10-29 ENCOUNTER — Encounter: Payer: Self-pay | Admitting: Physician Assistant

## 2015-10-29 ENCOUNTER — Ambulatory Visit (INDEPENDENT_AMBULATORY_CARE_PROVIDER_SITE_OTHER): Payer: 59 | Admitting: Physician Assistant

## 2015-10-29 VITALS — BP 122/84 | HR 79 | Ht 66.0 in | Wt 243.0 lb

## 2015-10-29 DIAGNOSIS — R635 Abnormal weight gain: Secondary | ICD-10-CM

## 2015-10-29 DIAGNOSIS — Z6833 Body mass index (BMI) 33.0-33.9, adult: Secondary | ICD-10-CM

## 2015-10-29 DIAGNOSIS — E6609 Other obesity due to excess calories: Secondary | ICD-10-CM | POA: Insufficient documentation

## 2015-10-29 DIAGNOSIS — I10 Essential (primary) hypertension: Secondary | ICD-10-CM | POA: Diagnosis not present

## 2015-10-29 DIAGNOSIS — Z6838 Body mass index (BMI) 38.0-38.9, adult: Secondary | ICD-10-CM | POA: Insufficient documentation

## 2015-10-29 DIAGNOSIS — E669 Obesity, unspecified: Secondary | ICD-10-CM

## 2015-10-29 DIAGNOSIS — L659 Nonscarring hair loss, unspecified: Secondary | ICD-10-CM | POA: Diagnosis not present

## 2015-10-29 DIAGNOSIS — Z79899 Other long term (current) drug therapy: Secondary | ICD-10-CM

## 2015-10-29 DIAGNOSIS — F439 Reaction to severe stress, unspecified: Secondary | ICD-10-CM | POA: Insufficient documentation

## 2015-10-29 DIAGNOSIS — Z658 Other specified problems related to psychosocial circumstances: Secondary | ICD-10-CM

## 2015-10-29 LAB — CBC WITH DIFFERENTIAL/PLATELET
BASOS ABS: 0 10*3/uL (ref 0.0–0.1)
Basophils Relative: 0 % (ref 0–1)
EOS PCT: 5 % (ref 0–5)
Eosinophils Absolute: 0.5 10*3/uL (ref 0.0–0.7)
HCT: 38.6 % (ref 36.0–46.0)
Hemoglobin: 12.8 g/dL (ref 12.0–15.0)
Lymphocytes Relative: 36 % (ref 12–46)
Lymphs Abs: 3.6 10*3/uL (ref 0.7–4.0)
MCH: 29.2 pg (ref 26.0–34.0)
MCHC: 33.2 g/dL (ref 30.0–36.0)
MCV: 88.1 fL (ref 78.0–100.0)
MONOS PCT: 7 % (ref 3–12)
MPV: 9.9 fL (ref 8.6–12.4)
Monocytes Absolute: 0.7 10*3/uL (ref 0.1–1.0)
Neutro Abs: 5.3 10*3/uL (ref 1.7–7.7)
Neutrophils Relative %: 52 % (ref 43–77)
PLATELETS: 293 10*3/uL (ref 150–400)
RBC: 4.38 MIL/uL (ref 3.87–5.11)
RDW: 12.9 % (ref 11.5–15.5)
WBC: 10.1 10*3/uL (ref 4.0–10.5)

## 2015-10-29 LAB — COMPLETE METABOLIC PANEL WITH GFR
ALK PHOS: 39 U/L (ref 33–115)
ALT: 10 U/L (ref 6–29)
AST: 13 U/L (ref 10–30)
Albumin: 3.9 g/dL (ref 3.6–5.1)
BUN: 15 mg/dL (ref 7–25)
CHLORIDE: 101 mmol/L (ref 98–110)
CO2: 24 mmol/L (ref 20–31)
Calcium: 9.4 mg/dL (ref 8.6–10.2)
Creat: 0.52 mg/dL (ref 0.50–1.10)
GFR, Est African American: >89 mL/min (ref >=60)
GLUCOSE: 89 mg/dL (ref 65–99)
POTASSIUM: 4.3 mmol/L (ref 3.5–5.3)
SODIUM: 137 mmol/L (ref 135–146)
Total Bilirubin: 0.3 mg/dL (ref 0.2–1.2)
Total Protein: 6.9 g/dL (ref 6.1–8.1)

## 2015-10-29 LAB — FERRITIN: FERRITIN: 45 ng/mL (ref 10–291)

## 2015-10-29 LAB — TSH: TSH: 0.899 u[IU]/mL (ref 0.350–4.500)

## 2015-10-29 MED ORDER — LISINOPRIL-HYDROCHLOROTHIAZIDE 10-12.5 MG PO TABS: 1.0000 | ORAL_TABLET | Freq: Every day | ORAL | Status: DC

## 2015-10-29 MED ORDER — ONDANSETRON HCL 4 MG PO TABS: 4.0000 mg | ORAL_TABLET | Freq: Three times a day (TID) | ORAL | Status: DC | PRN

## 2015-10-29 MED ORDER — BUPROPION HCL ER (XL) 150 MG PO TB24: 150.0000 mg | ORAL_TABLET | Freq: Every day | ORAL | Status: DC

## 2015-10-29 MED ORDER — PHENTERMINE HCL 37.5 MG PO TABS: 37.5000 mg | ORAL_TABLET | Freq: Every day | ORAL | Status: DC

## 2015-10-29 NOTE — Progress Notes (Signed)
   Subjective:    Patient ID: Kayla Campos, female    DOB: 12/16/1985, 29 y.o.   MRN: [id number]  HPI Pt is a 29 yo female who presents to the clinic to follow up on HTN. 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 She denies any chest pain, palpitations, headaches, dizziness.  She is concerned because she's had some recent hair loss. When she gets out of the shower she notices a lot of hair is in the drain. This is been going on for the last month. It is beginning to concern her. She admits she is very stressed at work and at home. She is very upset about her weight gain. She had gotten down to 206 and now she is 247. This makes her very upset. She feels like she is over eating because she can't get enough right now she would like to start back on something for weight loss. She did very well on phentermine in the past.   Review of Systems See HPI, otherwise negative.    Objective:   Physical Exam  Constitutional: She is oriented to person, place, and time. She appears well-developed and well-nourished.  Obese.   HENT:  Head: Normocephalic and atraumatic.  No patches of hair loss seen.   Neck: Normal range of motion. Neck supple. No thyromegaly present.  Cardiovascular: Normal rate, regular rhythm and normal heart sounds.   Pulmonary/Chest: Effort normal and breath sounds normal. She has no wheezes.  Neurological: She is alert and oriented to person, place, and time.  Psychiatric: She has a normal mood and affect. Her behavior is normal.          Assessment & Plan:  Hypertension-rechecked blood pressure and was great at 122/84. Refilled lisinopril/HCTZ for 6 months.  Stress-certainly think stress can cause some overeating. We started Wellbutrin 150 mg today. Discussed potential side effects. I think this would be a great option for patient because it can also help with weight loss. Will follow-up in 4-6 weeks.  Encouraged regular exercise and walking.  Abnormal weight gain/obesity-restarted phentermine for the first 2 months while starting Wellbutrin. This should give patient a head start on weight loss. I discussed she can make habits now that she can manage her weight and not keep going up and down. Patient instructed phentermine in the past with no side effects. Follow-up in one month.  Hair loss-reassured patient I did not see any past she hair loss today suggesting autoimmune issues. This could be some thyroid issues. We'll check her thyroid and CBC today to look for anemia. Could be due to stress or just cyclical hair loss. Will call with labs. Consider biotin for hair and nail growth.

## 2015-11-13 ENCOUNTER — Encounter: Payer: Self-pay | Admitting: Physician Assistant

## 2015-11-13 ENCOUNTER — Other Ambulatory Visit: Payer: Self-pay | Admitting: Physician Assistant

## 2015-11-13 MED ORDER — FLUCONAZOLE 150 MG PO TABS: 150.0000 mg | ORAL_TABLET | Freq: Once | ORAL | Status: DC

## 2015-11-13 MED FILL — FLUCONAZOLE 150 MG TABLET: 150 | 2 days supply | Qty: 2 | Fill #0

## 2015-11-13 NOTE — Progress Notes (Signed)
Pt called in with white thick discharge and itching. Hx of yeast infections. Started working out and gets from [employer] if there is moisture.

## 2015-11-15 MED FILL — PHENTERMINE 37.5 MG TABLET: 37.5 | 60 days supply | Qty: 60 | Fill #0

## 2015-11-15 MED FILL — NORGESTIMATE-ETH ESTRADIOL: 0.18/0.215/ | 84 days supply | Qty: 84 | Fill #1

## 2015-11-19 MED FILL — OMEPRAZOLE DR 40 MG CAPSULE: 40 | 30 days supply | Qty: 30 | Fill #3

## 2015-12-03 ENCOUNTER — Ambulatory Visit: Payer: 59 | Admitting: Physician Assistant

## 2015-12-26 ENCOUNTER — Other Ambulatory Visit: Payer: Self-pay | Admitting: Physician Assistant

## 2015-12-27 ENCOUNTER — Other Ambulatory Visit: Payer: Self-pay | Admitting: Physician Assistant

## 2015-12-27 MED FILL — OMEPRAZOLE DR 40 MG CAPSULE: 40 | 30 days supply | Qty: 30 | Fill #0

## 2015-12-31 DIAGNOSIS — R319 Hematuria, unspecified: Secondary | ICD-10-CM | POA: Diagnosis not present

## 2015-12-31 DIAGNOSIS — I10 Essential (primary) hypertension: Secondary | ICD-10-CM | POA: Diagnosis not present

## 2016-01-15 ENCOUNTER — Other Ambulatory Visit: Payer: Self-pay | Admitting: Nephrology

## 2016-01-15 DIAGNOSIS — R319 Hematuria, unspecified: Secondary | ICD-10-CM

## 2016-01-20 ENCOUNTER — Other Ambulatory Visit: Payer: 59

## 2016-01-22 MED FILL — OMEPRAZOLE DR 40 MG CAPSULE: 40 | 30 days supply | Qty: 30 | Fill #1

## 2016-01-22 MED FILL — LISINOPRIL-HCTZ 10-12.5 MG: 10-12.5 | 90 days supply | Qty: 90 | Fill #1

## 2016-01-28 ENCOUNTER — Ambulatory Visit
Admission: RE | Admit: 2016-01-28 | Discharge: 2016-01-28 | Disposition: A | Discharge status: Home / Self Care | Payer: 59 | Source: Ambulatory Visit | Attending: Nephrology | Admitting: Nephrology

## 2016-01-28 DIAGNOSIS — R319 Hematuria, unspecified: Secondary | ICD-10-CM

## 2016-01-28 DIAGNOSIS — N1330 Unspecified hydronephrosis: Secondary | ICD-10-CM | POA: Diagnosis not present

## 2016-02-26 ENCOUNTER — Ambulatory Visit (INDEPENDENT_AMBULATORY_CARE_PROVIDER_SITE_OTHER): Payer: 59 | Admitting: Physician Assistant

## 2016-02-26 ENCOUNTER — Encounter: Payer: Self-pay | Admitting: Physician Assistant

## 2016-02-26 VITALS — BP 132/69 | HR 86 | Ht 66.0 in | Wt 234.0 lb

## 2016-02-26 DIAGNOSIS — E669 Obesity, unspecified: Secondary | ICD-10-CM

## 2016-02-26 DIAGNOSIS — R319 Hematuria, unspecified: Secondary | ICD-10-CM | POA: Insufficient documentation

## 2016-02-26 DIAGNOSIS — Z0000 Encounter for general adult medical examination without abnormal findings: Secondary | ICD-10-CM | POA: Diagnosis not present

## 2016-02-26 MED ORDER — LISINOPRIL-HYDROCHLOROTHIAZIDE 10-12.5 MG PO TABS: 1.0000 | ORAL_TABLET | Freq: Every day | ORAL | Status: DC

## 2016-02-26 MED FILL — OMEPRAZOLE DR 40 MG CAPSULE: 40 | 30 days supply | Qty: 30 | Fill #2

## 2016-02-26 MED FILL — NORGESTIMATE-ETH ESTRADIOL: 0.18/0.215/ | 84 days supply | Qty: 84 | Fill #2

## 2016-02-26 NOTE — Patient Instructions (Signed)

## 2016-02-26 NOTE — Progress Notes (Signed)
  Subjective:     Kayla Campos is a 30 y.o. female and is here for a comprehensive physical exam. The patient reports problems - she reports she has been to nephrologist for hematuria in urine. US renal was normal. if continues to have hematuria when be sent to urology. no pain or issues. she does need form filled out so that she can go scuba dining. .  Social History   Social History  . Marital Status: Single    Spouse Name: N/A  . Number of Children: N/A  . Years of Education: N/A   Occupational History  . Not on file.   Social History Main Topics  . Smoking status: Never Smoker   . Smokeless tobacco: Not on file  . Alcohol Use: No  . Drug Use: No  . Sexual Activity: Yes   Other Topics Concern  . Not on file   Social History Narrative   Health Maintenance  Topic Date Due  . HIV Screening  03/09/2000  . PAP SMEAR  05/15/2016  . INFLUENZA VACCINE  06/09/2016  . TETANUS/TDAP  02/20/2023    The following portions of the patient's history were reviewed and updated as appropriate: allergies, current medications, past family history, past medical history, past social history, past surgical history and problem list.  Review of Systems Pertinent items noted in HPI and remainder of comprehensive ROS otherwise negative.   Objective:    BP 132/69 mmHg  Pulse 86  Ht 5\' 6"  (1.676 m)  Wt 234 lb (106.142 kg)  BMI 37.79 kg/m2 General appearance: alert, cooperative and appears stated age Head: Normocephalic, without obvious abnormality, atraumatic Eyes: conjunctivae/corneas clear. PERRL, EOM's intact. Fundi benign. Ears: normal TM's and external ear canals both ears Nose: Nares normal. Septum midline. Mucosa normal. No drainage or sinus tenderness. Throat: lips, mucosa, and tongue normal; teeth and gums normal Neck: no adenopathy, no carotid bruit, no JVD, supple, symmetrical, trachea midline and thyroid not enlarged, symmetric, no tenderness/mass/nodules Back:  symmetric, no curvature. ROM normal. No CVA tenderness. Lungs: clear to auscultation bilaterally Heart: regular rate and rhythm, S1, S2 normal, no murmur, click, rub or gallop Abdomen: soft, non-tender; bowel sounds normal; no masses,  no organomegaly Extremities: extremities normal, atraumatic, no cyanosis or edema Pulses: 2+ and symmetric Skin: Skin color, texture, turgor normal. No rashes or lesions Lymph nodes: Cervical, supraclavicular, and axillary nodes normal. Neurologic: Alert and oriented X 3, normal strength and tone. Normal symmetric reflexes. Normal coordination and gait    Assessment:    Healthy female exam.      Plan:    CPE- pap done at [facility]. Lipid and cmp ordered. Discussed vitamin d and calcium. [place] for scuba diving.   HTN- refilled lisinopril.   Obese- pt continues to lose weight. Would like to see BMI under 30. Discussed medications. Pt declined any at this time.   Hematuria- managed by nephrology and urology.  See After Visit Summary for Counseling Recommendations

## 2016-02-27 ENCOUNTER — Encounter: Payer: Self-pay | Admitting: Physician Assistant

## 2016-02-27 LAB — LIPID PANEL
CHOLESTEROL: 202 mg/dL — AB (ref 125–200)
HDL: 53 mg/dL (ref >=46)
LDL Cholesterol: 125 mg/dL (ref <130)
TRIGLYCERIDES: 119 mg/dL (ref <150)
Total CHOL/HDL Ratio: 3.8 Ratio (ref <=5.0)
VLDL: 24 mg/dL (ref <30)

## 2016-02-27 LAB — COMPLETE METABOLIC PANEL WITH GFR
ALT: 11 U/L (ref 6–29)
AST: 16 U/L (ref 10–30)
Albumin: 4 g/dL (ref 3.6–5.1)
Alkaline Phosphatase: 35 U/L (ref 33–115)
BUN: 14 mg/dL (ref 7–25)
CHLORIDE: 102 mmol/L (ref 98–110)
CO2: 24 mmol/L (ref 20–31)
Calcium: 9.4 mg/dL (ref 8.6–10.2)
Creat: 0.58 mg/dL (ref 0.50–1.10)
Glucose, Bld: 81 mg/dL (ref 65–99)
POTASSIUM: 4.3 mmol/L (ref 3.5–5.3)
Sodium: 139 mmol/L (ref 135–146)
Total Bilirubin: 0.3 mg/dL (ref 0.2–1.2)
Total Protein: 7 g/dL (ref 6.1–8.1)

## 2016-03-04 DIAGNOSIS — I10 Essential (primary) hypertension: Secondary | ICD-10-CM | POA: Diagnosis not present

## 2016-03-04 DIAGNOSIS — R319 Hematuria, unspecified: Secondary | ICD-10-CM | POA: Diagnosis not present

## 2016-04-02 MED FILL — LISINOPRIL-HCTZ 10-12.5 MG: 10-12.5 | 90 days supply | Qty: 90 | Fill #0

## 2016-04-02 MED FILL — OMEPRAZOLE DR 40 MG CAPSULE: 40 | 30 days supply | Qty: 30 | Fill #3

## 2016-04-27 MED FILL — OMEPRAZOLE DR 40 MG CAPSULE: 40 | 90 days supply | Qty: 90 | Fill #4

## 2016-05-04 ENCOUNTER — Other Ambulatory Visit: Payer: Self-pay | Admitting: Physician Assistant

## 2016-05-04 ENCOUNTER — Encounter: Payer: Self-pay | Admitting: Physician Assistant

## 2016-05-04 MED ORDER — FLUCONAZOLE 150 MG PO TABS: 150.0000 mg | ORAL_TABLET | Freq: Once | ORAL | Status: DC

## 2016-05-04 MED FILL — FLUCONAZOLE 150 MG TABLET: 150 | 2 days supply | Qty: 2 | Fill #0

## 2016-05-21 MED FILL — NORGESTIMATE-ETH ESTRADIOL: 0.18/0.215/ | 84 days supply | Qty: 84 | Fill #3

## 2016-07-22 ENCOUNTER — Encounter: Payer: Self-pay | Admitting: Emergency Medicine

## 2016-07-22 ENCOUNTER — Emergency Department
Admission: EM | Admit: 2016-07-22 | Discharge: 2016-07-22 | Disposition: A | Discharge status: Home / Self Care | Payer: 59 | Source: Home / Self Care | Attending: Family Medicine | Admitting: Family Medicine

## 2016-07-22 DIAGNOSIS — J069 Acute upper respiratory infection, unspecified: Secondary | ICD-10-CM | POA: Diagnosis not present

## 2016-07-22 DIAGNOSIS — B9789 Other viral agents as the cause of diseases classified elsewhere: Principal | ICD-10-CM

## 2016-07-22 DIAGNOSIS — R0981 Nasal congestion: Secondary | ICD-10-CM

## 2016-07-22 MED ORDER — BENZONATATE 100 MG PO CAPS: 100.0000 mg | ORAL_CAPSULE | Freq: Three times a day (TID) | ORAL | 0 refills | Status: DC

## 2016-07-22 MED ORDER — PREDNISONE 20 MG PO TABS: ORAL_TABLET | ORAL | 0 refills | Status: DC

## 2016-07-22 MED ORDER — FLUTICASONE PROPIONATE 50 MCG/ACT NA SUSP: 2.0000 | Freq: Every day | NASAL | 2 refills | Status: DC

## 2016-07-22 MED ORDER — AZITHROMYCIN 250 MG PO TABS: 250.0000 mg | ORAL_TABLET | Freq: Every day | ORAL | 0 refills | Status: DC

## 2016-07-22 NOTE — Discharge Instructions (Signed)
°  You may take 400-600mg  Ibuprofen (Motrin) every 6-8 hours for fever and pain  Alternate with Tylenol  You may take 500mg  Tylenol every 4-6 hours as needed for fever and pain  Follow-up with your primary care provider next week for recheck of symptoms if not improving.  Be sure to drink plenty of fluids and rest, at least 8hrs of sleep a night, preferably more while you are sick. Return urgent care or go to closest ER if you cannot keep down fluids/signs of dehydration, fever not reducing with Tylenol, difficulty breathing/wheezing, stiff neck, worsening condition, or other concerns (see below)   Your symptoms are likely due to a virus such as the common cold, however, if you developing worsening chest congestion with shortness of breath, persistent fever for 3 days, or symptoms not improving in 4-5 days, you may fill the antibiotic (azithromycin).  If you do fill the antibiotic,  please take antibiotics as prescribed and be sure to complete entire course even if you start to feel better to ensure infection does not come back.

## 2016-07-22 NOTE — ED Triage Notes (Signed)
Pt c/o cough with mucous, HA and facial pain x2 days.

## 2016-07-22 NOTE — ED Provider Notes (Signed)
CSN: [id number]     Arrival date & time 07/22/16  2009 History   First MD Initiated Contact with Patient 07/22/16 2025     Chief Complaint  Patient presents with  . Cough   (Consider location/radiation/quality/duration/timing/severity/associated sxs/prior Treatment) HPI  Kayla Campos is a 30 y.o. female presenting to UC with c/o moderate amount of nasal congestion with facial pain and pressure, mild intermittent productive cough from post-nasal drip.  Mild scratchy throat started 2 days ago while sinus pain and pressure started yesterday, worsened today.  Denies pain at this time. She did take Alkaseltzer cold without relief. Pt states she felt like the medication caused her to stay awake all night.  Denies fever, chills, n/v/d. No sick contacts or recent travel.  Hx of sinus infections in the past. Hx of asthma but has not needed to use her inhaler more than normal.    Past Medical History:  Diagnosis Date  . Asthma   . Hypertension   . Pleurisy   . PNA (pneumonia)    History reviewed. No pertinent surgical history. Family History  Problem Relation Age of Onset  . Hypertension Mother   . Diabetes Father   . Hypertension Father   . Coronary artery disease Father   . Hyperlipidemia Father   . Hypertension Sister   . Hyperlipidemia Maternal Aunt   . Heart attack Maternal Grandmother   . Heart attack Maternal Grandfather   . Diabetes Maternal Grandfather   . Stroke Maternal Grandfather   . Cancer Paternal Grandmother   . Heart attack Paternal Grandmother   . Diabetes Paternal Grandmother    Social History  Substance Use Topics  . Smoking status: Never Smoker  . Smokeless tobacco: Never Used  . Alcohol use No   OB History    No data available     Review of Systems  Constitutional: Negative for chills and fever.  HENT: Positive for congestion, postnasal drip, rhinorrhea, sinus pressure and sore throat. Negative for ear pain, trouble swallowing and voice change.    Respiratory: Positive for cough. Negative for chest tightness, shortness of breath and wheezing.   Gastrointestinal: Negative for abdominal pain, constipation, diarrhea and vomiting.  Neurological: Positive for headaches. Negative for dizziness and light-headedness.    Allergies  Keflex [cephalexin]  Home Medications   Prior to Admission medications   Medication Sig Start Date End Date Taking? Authorizing Provider  azithromycin (ZITHROMAX) 250 MG tablet Take 1 tablet (250 mg total) by mouth daily. Take first 2 tablets together, then 1 every day until finished. 07/22/16   Noland Fordyce, PA-C  benzonatate (TESSALON) 100 MG capsule Take 1-2 capsules (100-200 mg total) by mouth every 8 (eight) hours. 07/22/16   Noland Fordyce, PA-C  fluconazole (DIFLUCAN) 150 MG tablet Take 1 tablet (150 mg total) by mouth once. Repeat in 48-72 hours if symptoms persist. 05/04/16   Jade L Breeback, PA-C  fluticasone (FLONASE) 50 MCG/ACT nasal spray Place 2 sprays into both nostrils daily. 07/22/16   Noland Fordyce, PA-C  lisinopril-hydrochlorothiazide (PRINZIDE,ZESTORETIC) 10-12.5 MG tablet Take 1 tablet by mouth daily. 02/26/16   Jade L Breeback, PA-C  Norgestimate-Ethinyl Estradiol Triphasic (ORTHO TRI-CYCLEN LO) 0.18/0.215/0.25 MG-25 MCG tablet Take 1 tablet by mouth daily.    Historical Provider, MD  omeprazole (PRILOSEC) 40 MG capsule TAKE 1 CAPSULE (40 MG TOTAL) BY MOUTH DAILY. 12/27/15   Jade L Breeback, PA-C  ondansetron (ZOFRAN) 4 MG tablet Take 1 tablet (4 mg total) by mouth every 8 (eight) hours as  needed for nausea or vomiting. 10/29/15   Donella Stade, PA-C  predniSONE (DELTASONE) 20 MG tablet 3 tabs po day one, then 2 po daily x 4 days 07/22/16   Noland Fordyce, PA-C   Meds Ordered and Administered this Visit  Medications - No data to display  BP 128/80 (BP Location: Right Arm)   Pulse 106   Temp 98.6 F (37 C) (Oral)   Wt 225 lb (102.1 kg)   SpO2 98%   BMI 36.32 kg/m  No data  found.   Physical Exam  Constitutional: She is oriented to person, place, and time. She appears well-developed and well-nourished. No distress.  HENT:  Head: Normocephalic and atraumatic.  Right Ear: Tympanic membrane normal.  Left Ear: Tympanic membrane normal.  Nose: Right sinus exhibits maxillary sinus tenderness and frontal sinus tenderness. Left sinus exhibits maxillary sinus tenderness and frontal sinus tenderness.  Mouth/Throat: Uvula is midline, oropharynx is clear and moist and mucous membranes are normal.  Eyes: EOM are normal.  Neck: Normal range of motion. Neck supple.  Cardiovascular: Normal rate and regular rhythm.   Pulmonary/Chest: Effort normal and breath sounds normal. No stridor. No respiratory distress. She has no wheezes. She has no rales.  Musculoskeletal: Normal range of motion.  Lymphadenopathy:    She has no cervical adenopathy.  Neurological: She is alert and oriented to person, place, and time.  Skin: Skin is warm and dry. She is not diaphoretic.  Psychiatric: She has a normal mood and affect. Her behavior is normal.  Nursing note and vitals reviewed.   Urgent Care Course   Clinical Course    Procedures (including critical care time)  Labs Review Labs Reviewed - No data to display  Imaging Review No results found.   MDM   1. Viral URI with cough   2. Sinus congestion    Pt c/o cough with sinus congestion and pressure that started 2 days ago. No fever, chills, n/v/d.   Symptoms likely viral in nature given sudden onset.   Rx: prednisone, flonase, and tessalon.  Prescription to hold with expiration date for Azithromycin. May fill if persistent fever, worsening symptoms or not improving in 1 week. Patient verbalized understanding and agreement with treatment plan.     Noland Fordyce, PA-C 07/23/16 [phone]

## 2016-07-23 ENCOUNTER — Other Ambulatory Visit: Payer: Self-pay | Admitting: Physician Assistant

## 2016-07-23 ENCOUNTER — Encounter: Payer: Self-pay | Admitting: Physician Assistant

## 2016-07-24 ENCOUNTER — Other Ambulatory Visit: Payer: Self-pay

## 2016-07-24 ENCOUNTER — Encounter: Payer: Self-pay | Admitting: Physician Assistant

## 2016-07-24 ENCOUNTER — Other Ambulatory Visit: Payer: Self-pay | Admitting: Physician Assistant

## 2016-07-24 MED ORDER — FLUCONAZOLE 150 MG PO TABS: 150.0000 mg | ORAL_TABLET | Freq: Once | ORAL | 0 refills | Status: AC

## 2016-07-24 MED ORDER — FLUCONAZOLE 150 MG PO TABS: 150.0000 mg | ORAL_TABLET | Freq: Once | ORAL | 0 refills | Status: DC

## 2016-07-24 MED FILL — OMEPRAZOLE DR 40 MG CAPSULE: 40 | 90 days supply | Qty: 90 | Fill #5

## 2016-07-24 MED FILL — LISINOPRIL-HCTZ 10-12.5 MG: 10-12.5 | 90 days supply | Qty: 90 | Fill #1

## 2016-07-24 MED FILL — FLUCONAZOLE 150 MG TABLET: 150 | 2 days supply | Qty: 2 | Fill #0

## 2016-08-10 MED FILL — NORG-EE 0.18-0.215-0.25/0.0: 0.18/0.215/ | 84 days supply | Qty: 84 | Fill #4

## 2016-08-27 DIAGNOSIS — R3129 Other microscopic hematuria: Secondary | ICD-10-CM | POA: Diagnosis not present

## 2016-10-06 DIAGNOSIS — Z793 Long term (current) use of hormonal contraceptives: Secondary | ICD-10-CM | POA: Diagnosis not present

## 2016-10-06 DIAGNOSIS — Z130 Encounter for screening for diseases of the blood and blood-forming organs and certain disorders involving the immune mechanism: Secondary | ICD-10-CM | POA: Diagnosis not present

## 2016-10-06 DIAGNOSIS — Z1389 Encounter for screening for other disorder: Secondary | ICD-10-CM | POA: Diagnosis not present

## 2016-10-06 DIAGNOSIS — Z124 Encounter for screening for malignant neoplasm of cervix: Secondary | ICD-10-CM | POA: Diagnosis not present

## 2016-10-06 DIAGNOSIS — Z3041 Encounter for surveillance of contraceptive pills: Secondary | ICD-10-CM | POA: Diagnosis not present

## 2016-10-06 DIAGNOSIS — Z6835 Body mass index (BMI) 35.0-35.9, adult: Secondary | ICD-10-CM | POA: Diagnosis not present

## 2016-10-06 DIAGNOSIS — R87610 Atypical squamous cells of undetermined significance on cytologic smear of cervix (ASC-US): Secondary | ICD-10-CM | POA: Diagnosis not present

## 2016-10-06 DIAGNOSIS — Z01419 Encounter for gynecological examination (general) (routine) without abnormal findings: Secondary | ICD-10-CM | POA: Diagnosis not present

## 2016-10-06 DIAGNOSIS — Z1151 Encounter for screening for human papillomavirus (HPV): Secondary | ICD-10-CM | POA: Diagnosis not present

## 2016-10-09 LAB — HM PAP SMEAR: HM PAP: ABNORMAL

## 2016-10-27 MED FILL — NORG-EE 0.18-0.215-0.25/0.0: 0.18/0.215/ | 84 days supply | Qty: 84 | Fill #0

## 2016-10-29 ENCOUNTER — Other Ambulatory Visit: Payer: Self-pay | Admitting: Physician Assistant

## 2016-10-29 ENCOUNTER — Encounter: Payer: Self-pay | Admitting: Physician Assistant

## 2016-10-29 MED ORDER — LEVOCETIRIZINE DIHYDROCHLORIDE 5 MG PO TABS: 5.0000 mg | ORAL_TABLET | Freq: Every evening | ORAL | 5 refills | Status: DC

## 2016-10-29 MED FILL — LISINOPRIL-HCTZ 10-12.5 MG: 10-12.5 | 90 days supply | Qty: 90 | Fill #2

## 2016-10-30 ENCOUNTER — Other Ambulatory Visit: Payer: Self-pay | Admitting: Physician Assistant

## 2016-10-30 MED ORDER — RANITIDINE HCL 150 MG PO TABS: 150.0000 mg | ORAL_TABLET | Freq: Two times a day (BID) | ORAL | 2 refills | Status: DC

## 2016-11-03 MED FILL — raNITIdine HCL 150 MG TABS: 150 | 30 days supply | Qty: 60 | Fill #0

## 2016-11-03 MED FILL — LEVOCETIRIZINE 5 MG TABLET: 5 | 30 days supply | Qty: 30 | Fill #0

## 2016-12-01 MED FILL — LEVOCETIRIZINE 5 MG TABLET: 5 | 90 days supply | Qty: 90 | Fill #1

## 2016-12-01 MED FILL — raNITIdine HCL 150 MG TABS: 150 | 60 days supply | Qty: 120 | Fill #1

## 2016-12-10 ENCOUNTER — Other Ambulatory Visit: Payer: Self-pay | Admitting: Physician Assistant

## 2016-12-10 ENCOUNTER — Encounter: Payer: Self-pay | Admitting: Physician Assistant

## 2016-12-10 MED ORDER — FLUCONAZOLE 150 MG PO TABS
150.0000 mg | ORAL_TABLET | Freq: Once | ORAL | 0 refills | Status: AC
Start: 2016-12-10 — End: 2016-12-10

## 2016-12-10 MED FILL — FLUCONAZOLE 150 MG TABLET: 150 | 2 days supply | Qty: 2 | Fill #0

## 2016-12-25 IMAGING — US US RENAL
1 series · 14 of 25 positions shown · non-contrast
Comparison: Abdominal ultrasound dated May 30, 2014

CLINICAL DATA: Hematuria, history of hypertension.

EXAM:
RENAL / URINARY TRACT ULTRASOUND COMPLETE

[Series 1: us renal · 0.33mm/px · 14 of 29 slices shown]
[im 1/29]
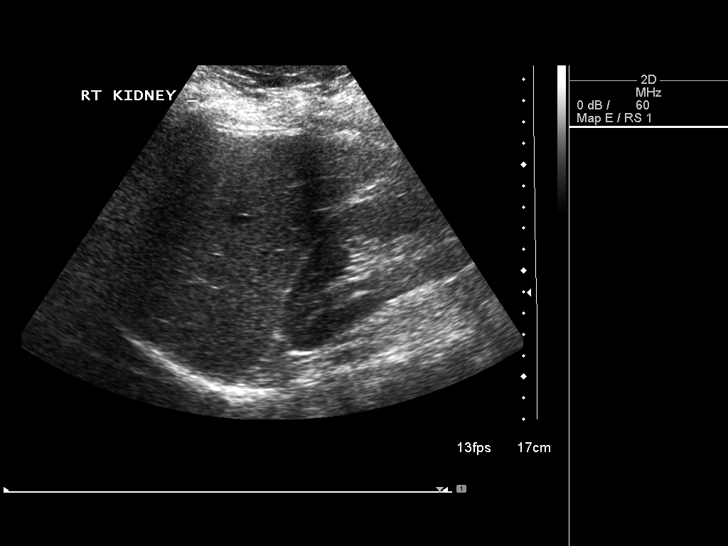
[im 3/29]
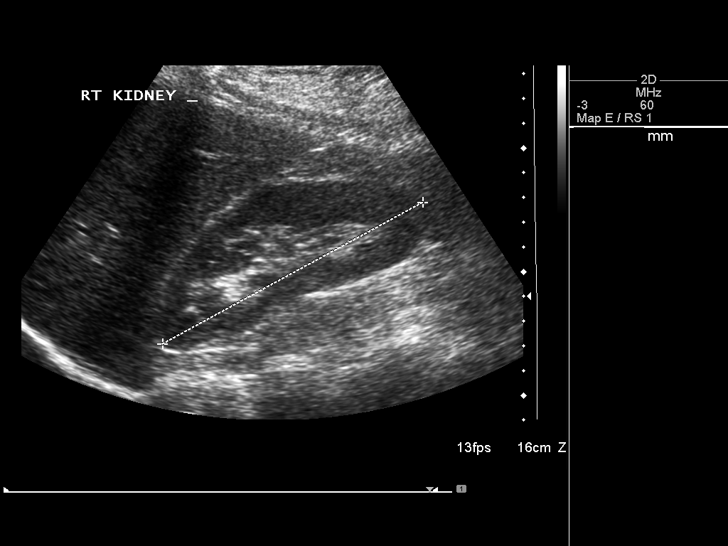
[im 5/29]
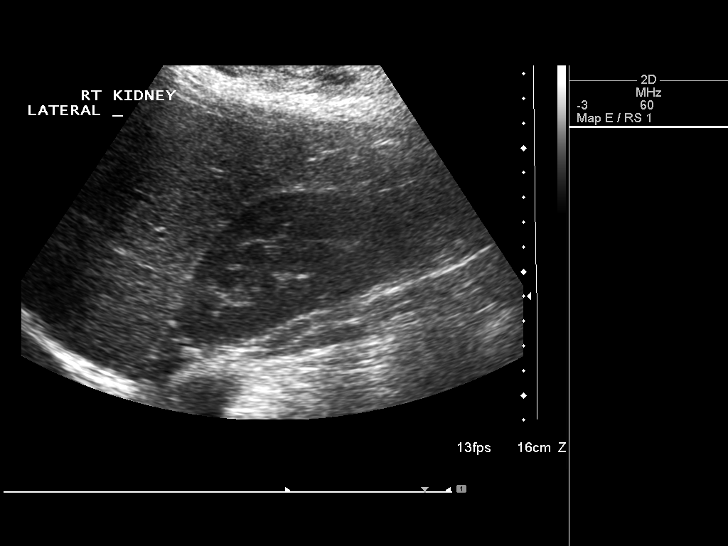
[im 8/29]
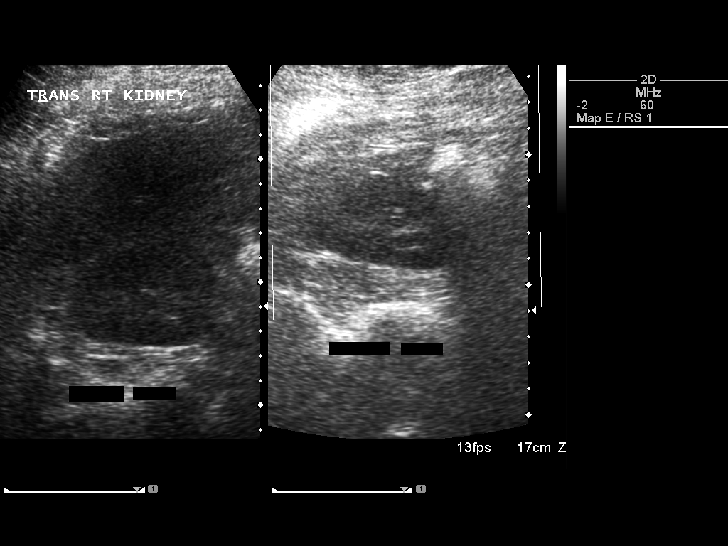
[im 10/29]
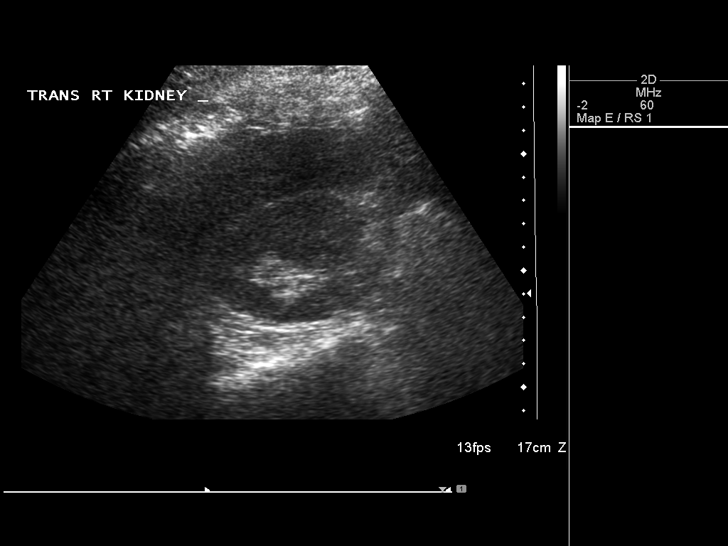
[im 11/29]
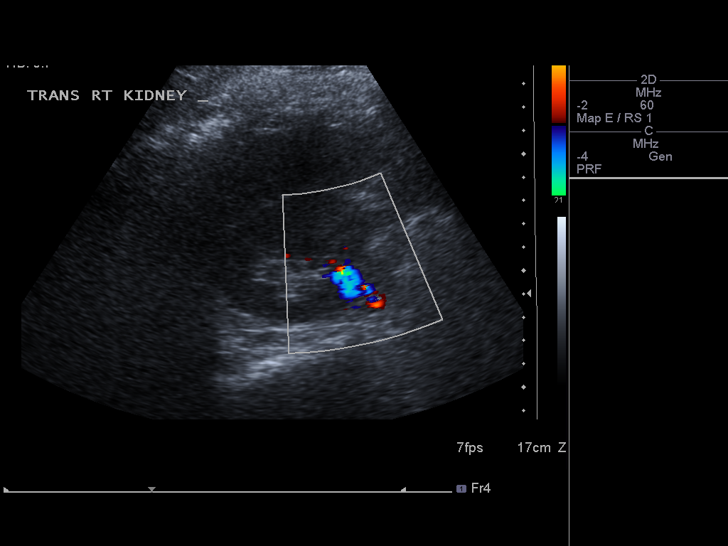
[im 13/29]
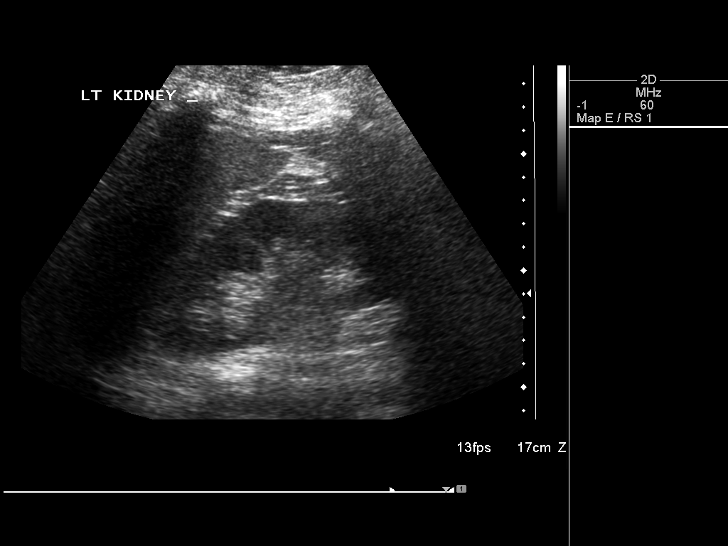
[im 16/29]
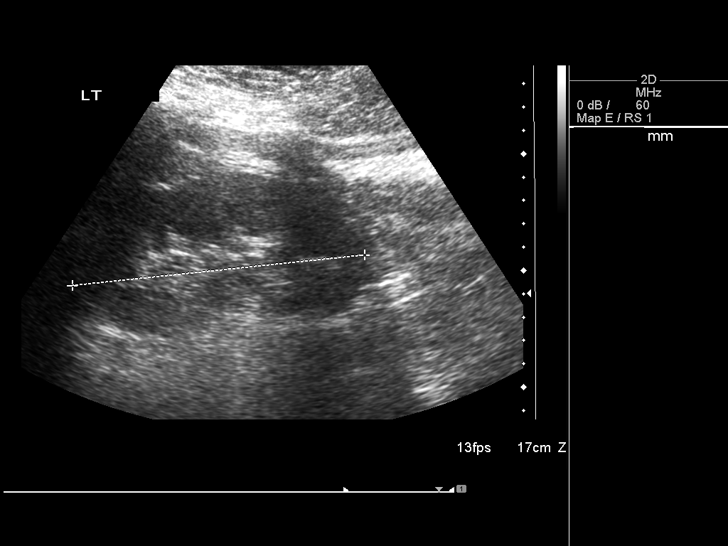
[im 18/29]
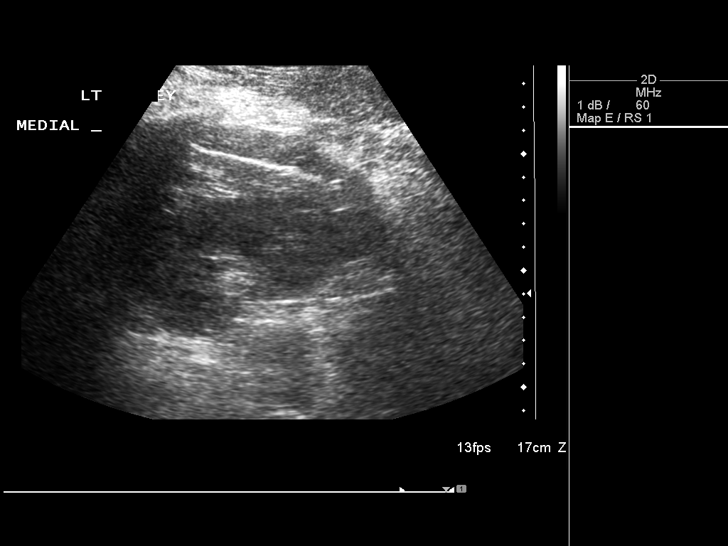
[im 19/29]
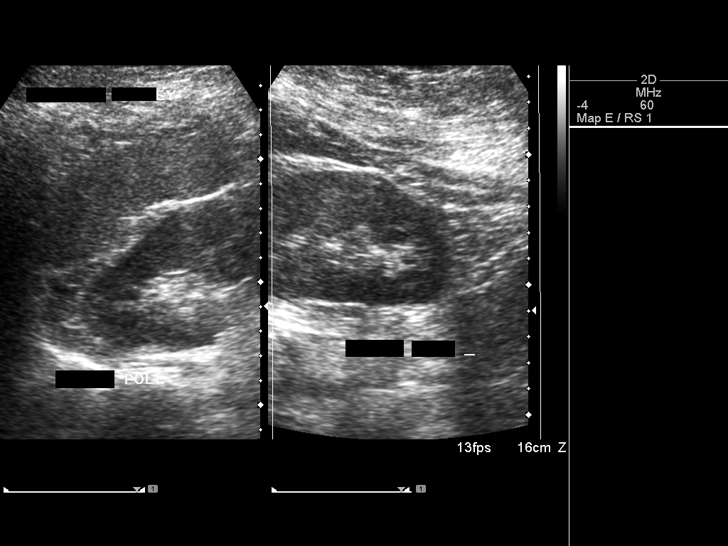
[im 22/29]
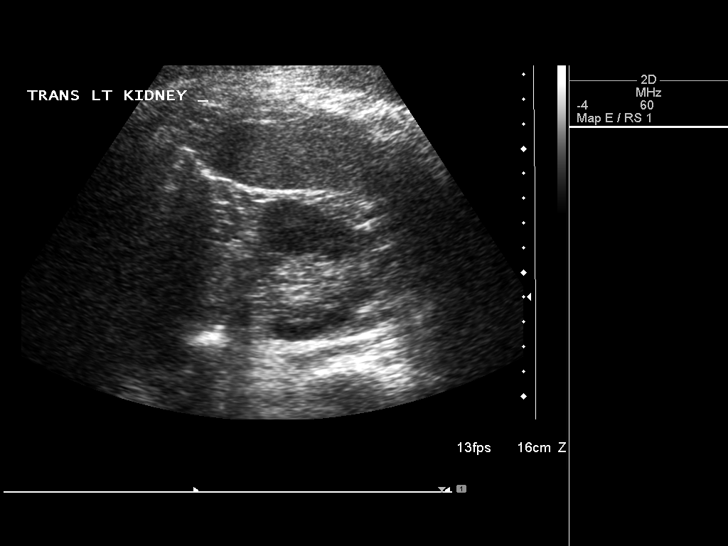
[im 24/29]
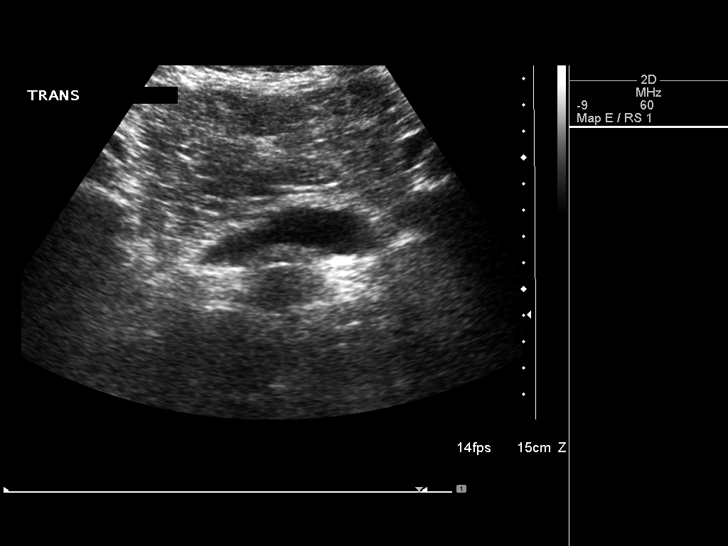
[im 26/29]
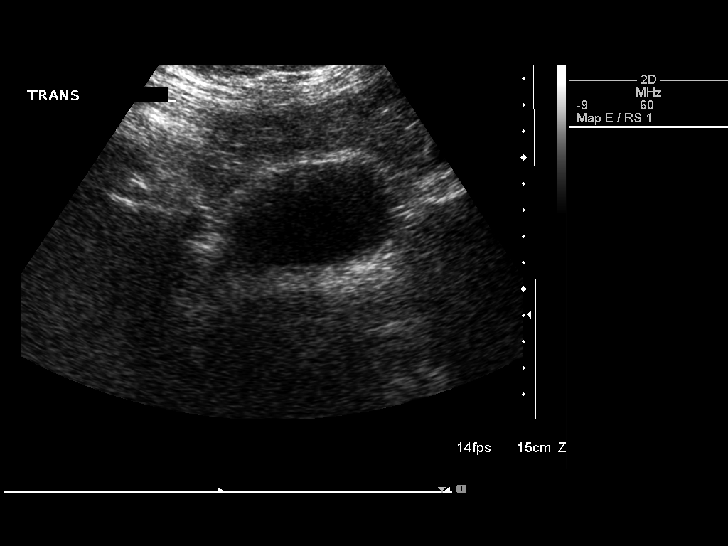
[im 29/29]
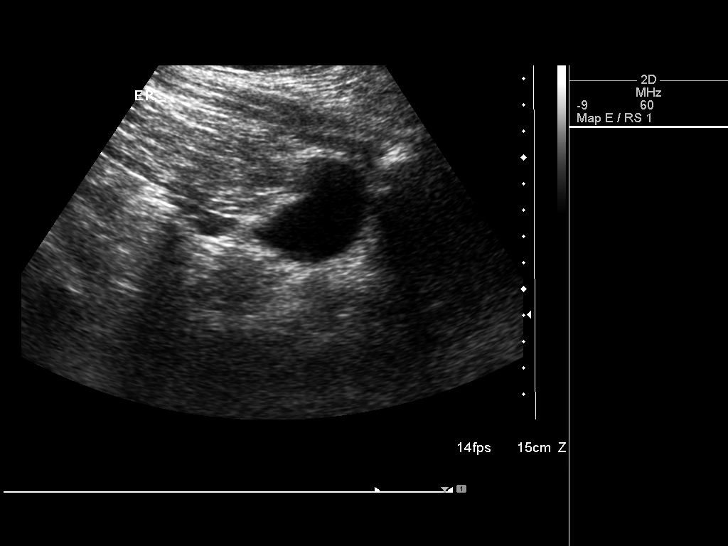

[14 of 25 positions shown; findings below may reference images not displayed]

FINDINGS: Right Kidney:

Length: 12.0 cm. The renal cortical echotexture remains lower than
that of the liver. There is no focal mass, hydronephrosis, nor
stones.

Left Kidney:

Length: 12.6 cm. The cortical echotexture is similar to that on the
right. There is no hydronephrosis, cystic or solid mass, nor stones.

Bladder:

The urinary bladder is only partially distended but is grossly
normal.
IMPRESSION: Normal renal ultrasound examination. Limited visualization of the
urinary bladder.

## 2017-02-04 MED FILL — NORG-EE 0.18-0.215-0.25/0.0: 0.18/0.215/ | 84 days supply | Qty: 84 | Fill #1

## 2017-02-04 MED FILL — LISINOPRIL-HCTZ 10-12.5 MG: 10-12.5 | 90 days supply | Qty: 90 | Fill #3

## 2017-03-04 DIAGNOSIS — R319 Hematuria, unspecified: Secondary | ICD-10-CM | POA: Diagnosis not present

## 2017-03-04 DIAGNOSIS — I10 Essential (primary) hypertension: Secondary | ICD-10-CM | POA: Diagnosis not present

## 2017-03-04 DIAGNOSIS — Z6834 Body mass index (BMI) 34.0-34.9, adult: Secondary | ICD-10-CM | POA: Diagnosis not present

## 2017-03-18 MED FILL — LEVOCETIRIZINE 5 MG TABLET: 5 | 60 days supply | Qty: 60 | Fill #2

## 2017-03-24 MED FILL — FLUCONAZOLE 150 MG TABLET: 150 | 3 days supply | Qty: 2 | Fill #0

## 2017-05-06 ENCOUNTER — Other Ambulatory Visit: Payer: Self-pay | Admitting: Physician Assistant

## 2017-05-06 MED FILL — NORG-EE 0.18-0.215-0.25/0.0: 0.18/0.215/ | 84 days supply | Qty: 84 | Fill #2

## 2017-05-06 MED FILL — LEVOCETIRIZINE 5 MG TABLET: 5 | 30 days supply | Qty: 30 | Fill #0

## 2017-05-06 MED FILL — LISINOPRIL-HCTZ 10-12.5 MG: 10-12.5 | 30 days supply | Qty: 30 | Fill #0

## 2017-05-06 MED FILL — raNITIdine HCL 150 MG TABS: 150 | 30 days supply | Qty: 60 | Fill #0

## 2017-06-15 ENCOUNTER — Other Ambulatory Visit: Payer: Self-pay | Admitting: Physician Assistant

## 2017-06-15 MED FILL — LISINOPRIL-HCTZ 10-12.5 MG: 10-12.5 | 30 days supply | Qty: 30 | Fill #0

## 2017-06-23 ENCOUNTER — Encounter: Payer: Self-pay | Admitting: Physician Assistant

## 2017-06-24 ENCOUNTER — Encounter: Payer: Self-pay | Admitting: Physician Assistant

## 2017-06-25 MED ORDER — FLUCONAZOLE 150 MG PO TABS: 150.0000 mg | ORAL_TABLET | Freq: Once | ORAL | 0 refills | Status: AC

## 2017-06-25 MED FILL — FLUCONAZOLE 150 MG TABLET: 150 | 2 days supply | Qty: 2 | Fill #0

## 2017-07-14 ENCOUNTER — Other Ambulatory Visit: Payer: Self-pay | Admitting: Physician Assistant

## 2017-07-15 MED FILL — raNITIdine HCL 150 MG TABS: 150 | 7 days supply | Qty: 15 | Fill #0

## 2017-07-15 MED FILL — LISINOPRIL-HCTZ 10-12.5 MG: 10-12.5 | 15 days supply | Qty: 15 | Fill #0

## 2017-08-02 ENCOUNTER — Ambulatory Visit (INDEPENDENT_AMBULATORY_CARE_PROVIDER_SITE_OTHER): Payer: 59 | Admitting: Physician Assistant

## 2017-08-02 ENCOUNTER — Encounter: Payer: Self-pay | Admitting: Physician Assistant

## 2017-08-02 VITALS — BP 125/60 | HR 69 | Ht 66.0 in | Wt 218.0 lb

## 2017-08-02 DIAGNOSIS — Z13220 Encounter for screening for lipoid disorders: Secondary | ICD-10-CM | POA: Diagnosis not present

## 2017-08-02 DIAGNOSIS — Z131 Encounter for screening for diabetes mellitus: Secondary | ICD-10-CM | POA: Diagnosis not present

## 2017-08-02 DIAGNOSIS — Z0000 Encounter for general adult medical examination without abnormal findings: Secondary | ICD-10-CM | POA: Diagnosis not present

## 2017-08-02 DIAGNOSIS — Z6836 Body mass index (BMI) 36.0-36.9, adult: Secondary | ICD-10-CM

## 2017-08-02 DIAGNOSIS — K219 Gastro-esophageal reflux disease without esophagitis: Secondary | ICD-10-CM

## 2017-08-02 DIAGNOSIS — B373 Candidiasis of vulva and vagina: Secondary | ICD-10-CM | POA: Diagnosis not present

## 2017-08-02 DIAGNOSIS — B3731 Acute candidiasis of vulva and vagina: Secondary | ICD-10-CM

## 2017-08-02 DIAGNOSIS — I10 Essential (primary) hypertension: Secondary | ICD-10-CM | POA: Diagnosis not present

## 2017-08-02 MED ORDER — LISINOPRIL-HYDROCHLOROTHIAZIDE 10-12.5 MG PO TABS: ORAL_TABLET | ORAL | 4 refills | Status: DC

## 2017-08-02 MED ORDER — RANITIDINE HCL 150 MG PO TABS: 150.0000 mg | ORAL_TABLET | Freq: Two times a day (BID) | ORAL | 5 refills | Status: DC

## 2017-08-02 MED ORDER — CETIRIZINE HCL 10 MG PO CHEW: 10.0000 mg | CHEWABLE_TABLET | Freq: Every day | ORAL | 5 refills | Status: DC

## 2017-08-02 MED ORDER — LIRAGLUTIDE -WEIGHT MANAGEMENT 18 MG/3ML ~~LOC~~ SOPN: 0.6000 mg | PEN_INJECTOR | Freq: Every day | SUBCUTANEOUS | 1 refills | Status: DC

## 2017-08-02 MED FILL — LISINOPRIL-HCTZ 10-12.5 MG: 10-12.5 | 90 days supply | Qty: 90 | Fill #0

## 2017-08-02 MED FILL — raNITIdine HCL 150 MG TABS: 150 | 30 days supply | Qty: 60 | Fill #0

## 2017-08-02 MED FILL — ALL DAY ALLERGY 10 MG TAB: 10 | 100 days supply | Qty: 100 | Fill #0

## 2017-08-02 NOTE — Progress Notes (Deleted)
   Subjective:    Patient ID: Kayla Campos, female    DOB: [birth year], 31 y.o.   MRN: [id number]  HPI The patient is a 31 yo pleasant female who presents today for an annual physical. She reports a history of vaginal yeast infections and asked about any options to limit the number of yeast infections. She had a PAP this year at [facility].  LMP was 2 weeks ago.   Review of Systems     Objective:   Physical Exam      Assessment & Plan:

## 2017-08-02 NOTE — Progress Notes (Signed)
Kayla Campos is a 31 y.o. female and is here for a comprehensive physical exam. The patient reports problems - recurrent yeast infection. pt is working out a lot. She is frustrated because she loses weight and then gains right back. She is working out consisently and hard but having trouble controlling her appetitie.BMI 35  Social History   Social History  . Marital status: Single    Spouse name: N/A  . Number of children: N/A  . Years of education: N/A   Occupational History  . Not on file.   Social History Main Topics  . Smoking status: Never Smoker  . Smokeless tobacco: Never Used  . Alcohol use No  . Drug use: No  . Sexual activity: Yes   Other Topics Concern  . Not on file   Social History Narrative  . No narrative on file   Health Maintenance  Topic Date Due  . HIV Screening  03/09/2000  . PAP SMEAR  05/18/2016  . TETANUS/TDAP  02/20/2023  . INFLUENZA VACCINE  Completed    The following portions of the patient's history were reviewed and updated as appropriate: allergies, current medications, past family history, past medical history, past social history, past surgical history and problem list.  Review of Systems Pertinent items noted in HPI and remainder of comprehensive ROS otherwise negative.   Objective:    BP 125/60   Pulse 69   Ht 5\' 6"  (1.676 m)   Wt 218 lb (98.9 kg)   BMI 35.19 kg/m  General appearance: alert, cooperative, appears stated age and moderately obese Head: Normocephalic, without obvious abnormality, atraumatic Eyes: conjunctivae/corneas clear. PERRL, EOM's intact. Fundi benign. Ears: normal TM's and external ear canals both ears Nose: Nares normal. Septum midline. Mucosa normal. No drainage or sinus tenderness. Throat: lips, mucosa, and tongue normal; teeth and gums normal Neck: no adenopathy, no carotid bruit, no JVD, supple, symmetrical, trachea midline and thyroid not enlarged, symmetric, no  tenderness/mass/nodules Lungs: clear to auscultation bilaterally   Heart: NSR, S1 and S2, no murmurs Musculoskeletal: normal reflexes, normal upper and lower extremity strength.  Pulses: symmetrical pulses in extremities.  Neurological: Grossly intact.    Assessment:    Healthy female exam.     Plan:    .Marland KitchenLaren was seen today for annual exam.  Diagnoses and all orders for this visit:  Encounter for annual physical exam -     COMPLETE METABOLIC PANEL WITH GFR -     Lipid Panel w/reflex Direct LDL -     TSH  Vaginal yeast infection  Screening for diabetes mellitus -     COMPLETE METABOLIC PANEL WITH GFR  Screening for lipid disorders -     Lipid Panel w/reflex Direct LDL  Essential hypertension, benign -     lisinopril-hydrochlorothiazide (PRINZIDE,ZESTORETIC) 10-12.5 MG tablet; TAKE 1 TABLET BY MOUTH DAILY.  Gastroesophageal reflux disease without esophagitis -     ranitidine (ZANTAC) 150 MG tablet; Take 1 tablet (150 mg total) by mouth 2 (two) times daily.  BMI 36.0-36.9,adult -     Liraglutide -Weight Management (SAXENDA) 18 MG/3ML SOPN; Inject 0.6 mg into the skin daily. For one week then increase by .6mg  weekly until reaches 3mg  daily.  Please include ultra fine needles 27mm  Other orders -     cetirizine (ZYRTEC) 10 MG chewable tablet; Chew 1 tablet (10 mg total) by mouth daily.   .. Depression screen [facility] 2/9 08/02/2017 10/11/2013  Decreased Interest 0 0  Down,  Depressed, Hopeless 0 0  PHQ - 2 Score 0 0   .Marland Kitchen Discussed 150 minutes of exercise a week.  Encouraged vitamin D 1000 units and Calcium 1300mg  or 4 servings of dairy a day.  Marland Kitchen.Discussed low carb diet with 1500 calories and 80g of protein.  My [employer]itness Pal could be a Microbiologist.  Discussed medication options. Tried and gained weight back with phentermine.  Started saxenda. Discussed side effects. Pt denies any thyroid cancers or pancreatitis both personal and fam hx. Follow up in 1 month. Discussed  titration up.   Pap up to date with GYN.  Pt denied STD testing.   Medications refilled.   Discussed boric acid suppositories and keep vaginal pH 4.5 or less.   See After Visit Summary for Counseling Recommendations

## 2017-08-02 NOTE — Patient Instructions (Signed)

## 2017-08-03 LAB — COMPLETE METABOLIC PANEL WITH GFR
AG RATIO: 1.3 (calc) (ref 1.0–2.5)
ALKALINE PHOSPHATASE (APISO): 41 U/L (ref 33–115)
ALT: 10 U/L (ref 6–29)
AST: 13 U/L (ref 10–30)
Albumin: 3.9 g/dL (ref 3.6–5.1)
BUN/Creatinine Ratio: 35 (calc) — ABNORMAL HIGH (ref 6–22)
BUN: 17 mg/dL (ref 7–25)
CHLORIDE: 102 mmol/L (ref 98–110)
CO2: 27 mmol/L (ref 20–32)
Calcium: 9.1 mg/dL (ref 8.6–10.2)
Creat: 0.49 mg/dL — ABNORMAL LOW (ref 0.50–1.10)
GFR, Est African American: 149 mL/min/{1.73_m2} (ref >=60)
GFR, Est Non African American: 129 mL/min/{1.73_m2} (ref >=60)
GLUCOSE: 78 mg/dL (ref 65–99)
Globulin: 3 g/dL (calc) (ref 1.9–3.7)
Potassium: 4.3 mmol/L (ref 3.5–5.3)
Sodium: 137 mmol/L (ref 135–146)
Total Bilirubin: 0.3 mg/dL (ref 0.2–1.2)
Total Protein: 6.9 g/dL (ref 6.1–8.1)

## 2017-08-03 LAB — LIPID PANEL W/REFLEX DIRECT LDL
CHOLESTEROL: 201 mg/dL — AB (ref <200)
HDL: 76 mg/dL (ref >50)
LDL CHOLESTEROL (CALC): 109 mg/dL — AB
Non-HDL Cholesterol (Calc): 125 mg/dL (calc) (ref <130)
TRIGLYCERIDES: 72 mg/dL (ref <150)
Total CHOL/HDL Ratio: 2.6 (calc) (ref <5.0)

## 2017-08-03 LAB — TSH: TSH: 0.84 m[IU]/L

## 2017-08-03 NOTE — Progress Notes (Signed)
Call pt: glucose looks great. Kidney function just a little low. Make sure stay hydrated.  HDL GREAT.  LDL almost optimal! Thyroid looks great.

## 2017-08-05 ENCOUNTER — Encounter: Payer: Self-pay | Admitting: Physician Assistant

## 2017-08-05 MED FILL — NORG-EE 0.18-0.215-0.25/0.0: 0.18/0.215/ | 84 days supply | Qty: 84 | Fill #3

## 2017-08-06 NOTE — Telephone Encounter (Signed)
[facility], they do not have boric acid suppositories/capsules. They recommended the following pharmacies: [facility] ([place]), or [facility] ([place]).

## 2017-08-06 NOTE — Telephone Encounter (Signed)
Can we call pharmacy and see if they have boric acid suppositories/capsules?

## 2017-08-09 ENCOUNTER — Telehealth: Payer: Self-pay | Admitting: *Deleted

## 2017-08-09 NOTE — Telephone Encounter (Signed)
Pre Authorization sent to cover my meds. [id number]

## 2017-08-10 ENCOUNTER — Ambulatory Visit: Payer: 59 | Admitting: Physician Assistant

## 2017-08-11 ENCOUNTER — Encounter: Payer: Self-pay | Admitting: Physician Assistant

## 2017-08-11 MED ORDER — AMBULATORY NON FORMULARY MEDICATION: 5 refills | Status: DC

## 2017-08-12 ENCOUNTER — Other Ambulatory Visit: Payer: Self-pay

## 2017-08-12 ENCOUNTER — Encounter: Payer: Self-pay | Admitting: Physician Assistant

## 2017-08-12 MED ORDER — AMBULATORY NON FORMULARY MEDICATION: 5 refills | Status: DC

## 2017-08-17 MED FILL — SAXENDA 18 MG/3 ML PEN: 18 | 30 days supply | Qty: 15 | Fill #0

## 2017-08-18 MED FILL — TECHLITE PEN NDL 32GX1/4": 32G X 6 MM | 30 days supply | Qty: 100 | Fill #0

## 2017-08-18 MED FILL — TECHLITE PEN NDL 32GX1/4: 32G X 6 MM | 30 days supply | Qty: 100 | Fill #0

## 2017-08-18 NOTE — Telephone Encounter (Signed)
Approved through insurance. Left message on vm. Left message on pharm on vm

## 2017-09-07 ENCOUNTER — Ambulatory Visit: Payer: 59 | Admitting: Physician Assistant

## 2017-09-14 ENCOUNTER — Ambulatory Visit: Payer: 59 | Admitting: Physician Assistant

## 2017-10-06 MED FILL — raNITIdine HCL 150 MG TABS: 150 | 30 days supply | Qty: 60 | Fill #1

## 2017-10-20 MED FILL — LISINOPRIL-HCTZ 10-12.5 MG: 10-12.5 | 90 days supply | Qty: 90 | Fill #1

## 2017-10-20 MED FILL — NORG-EE 0.18-0.215-0.25/0.0: 0.18/0.215/ | 28 days supply | Qty: 28 | Fill #4

## 2017-11-16 DIAGNOSIS — Z1151 Encounter for screening for human papillomavirus (HPV): Secondary | ICD-10-CM | POA: Diagnosis not present

## 2017-11-16 DIAGNOSIS — Z1389 Encounter for screening for other disorder: Secondary | ICD-10-CM | POA: Diagnosis not present

## 2017-11-16 DIAGNOSIS — Z130 Encounter for screening for diseases of the blood and blood-forming organs and certain disorders involving the immune mechanism: Secondary | ICD-10-CM | POA: Diagnosis not present

## 2017-11-16 DIAGNOSIS — Z01419 Encounter for gynecological examination (general) (routine) without abnormal findings: Secondary | ICD-10-CM | POA: Diagnosis not present

## 2017-11-16 DIAGNOSIS — Z3041 Encounter for surveillance of contraceptive pills: Secondary | ICD-10-CM | POA: Diagnosis not present

## 2017-11-16 DIAGNOSIS — B372 Candidiasis of skin and nail: Secondary | ICD-10-CM | POA: Diagnosis not present

## 2017-11-16 DIAGNOSIS — Z124 Encounter for screening for malignant neoplasm of cervix: Secondary | ICD-10-CM | POA: Diagnosis not present

## 2017-11-16 MED FILL — NORG-EE 0.18-0.215-0.25/0.0: 0.18/0.215/ | 84 days supply | Qty: 84 | Fill #0

## 2017-11-16 MED FILL — TERCONAZOLE 0.8% VAGINAL CR: 0.8 | 10 days supply | Qty: 20 | Fill #0

## 2017-12-10 MED FILL — raNITIdine HCL 150 MG TABS: 150 | 30 days supply | Qty: 60 | Fill #2

## 2018-02-02 ENCOUNTER — Other Ambulatory Visit: Payer: Self-pay | Admitting: Physician Assistant

## 2018-02-02 DIAGNOSIS — K219 Gastro-esophageal reflux disease without esophagitis: Secondary | ICD-10-CM

## 2018-02-02 MED FILL — LISINOPRIL-HCTZ 10-12.5 MG: 10-12.5 | 90 days supply | Qty: 90 | Fill #2

## 2018-02-02 MED FILL — NORG-EE 0.18-0.215-0.25/0.0: 0.18/0.215/ | 84 days supply | Qty: 84 | Fill #1

## 2018-03-01 DIAGNOSIS — L309 Dermatitis, unspecified: Secondary | ICD-10-CM | POA: Diagnosis not present

## 2018-03-01 DIAGNOSIS — D229 Melanocytic nevi, unspecified: Secondary | ICD-10-CM | POA: Diagnosis not present

## 2018-03-01 DIAGNOSIS — L821 Other seborrheic keratosis: Secondary | ICD-10-CM | POA: Diagnosis not present

## 2018-03-15 DIAGNOSIS — D485 Neoplasm of uncertain behavior of skin: Secondary | ICD-10-CM | POA: Diagnosis not present

## 2018-03-15 DIAGNOSIS — L810 Postinflammatory hyperpigmentation: Secondary | ICD-10-CM | POA: Diagnosis not present

## 2018-03-15 MED FILL — TRIAMCINOLONE 0.1% CREAM: 0.1 | 30 days supply | Qty: 75 | Fill #0

## 2018-03-23 MED FILL — TACROLIMUS 0.1 % OINT: 0.1 | 30 days supply | Qty: 100 | Fill #0

## 2018-05-05 MED FILL — TRI-SPRINTEC TABLET: 0.18/0.215/ | 84 days supply | Qty: 84 | Fill #2

## 2018-05-06 MED FILL — LISINOPRIL-HCTZ 10-12.5 MG: 10-12.5 | 90 days supply | Qty: 90 | Fill #3

## 2018-07-21 MED FILL — TRI-SPRINTEC TABLET: 0.18/0.215/ | 84 days supply | Qty: 84 | Fill #3

## 2018-08-15 ENCOUNTER — Other Ambulatory Visit: Payer: Self-pay | Admitting: Physician Assistant

## 2018-08-15 MED ORDER — AMBULATORY NON FORMULARY MEDICATION: 11 refills | Status: DC

## 2018-10-12 ENCOUNTER — Ambulatory Visit (INDEPENDENT_AMBULATORY_CARE_PROVIDER_SITE_OTHER): Payer: Self-pay | Admitting: Nurse Practitioner

## 2018-10-12 VITALS — BP 115/65 | HR 77 | Temp 97.4°F | Resp 16 | Wt 224.2 lb

## 2018-10-12 DIAGNOSIS — J069 Acute upper respiratory infection, unspecified: Secondary | ICD-10-CM

## 2018-10-12 DIAGNOSIS — R062 Wheezing: Secondary | ICD-10-CM

## 2018-10-12 MED ORDER — PREDNISONE 10 MG (21) PO TBPK: ORAL_TABLET | ORAL | 0 refills | Status: AC

## 2018-10-12 MED ORDER — PROMETHAZINE-DM 6.25-15 MG/5ML PO SYRP: 5.0000 mL | ORAL_SOLUTION | Freq: Four times a day (QID) | ORAL | 0 refills | Status: AC | PRN

## 2018-10-12 MED ORDER — ALBUTEROL SULFATE HFA 108 (90 BASE) MCG/ACT IN AERS: 2.0000 | INHALATION_SPRAY | Freq: Four times a day (QID) | RESPIRATORY_TRACT | 0 refills | Status: DC | PRN

## 2018-10-12 MED ORDER — AZITHROMYCIN 250 MG PO TABS: ORAL_TABLET | ORAL | 0 refills | Status: AC

## 2018-10-12 MED ORDER — BENZONATATE 200 MG PO CAPS: 200.0000 mg | ORAL_CAPSULE | Freq: Three times a day (TID) | ORAL | 0 refills | Status: AC | PRN

## 2018-10-12 NOTE — Progress Notes (Signed)
Subjective:    Patient ID: Kayla Campos, female    DOB: [birth year], 32 y.o.   MRN: [id number]  The patient is a 32 year old female who presents today for complaints of cough and chest tightness.  The patient states her symptoms started about 4 to 5 days ago after she was out shopping.  The patient states the next day she woke up with headache, sore throat, nasal congestion and cough.  The patient states over the last day or so the cough has progressively worsened, with wheezing, chest tightness, and chest pain.  The patient does have an underlying history of asthma, but she denies any recent exacerbations.  Patient states she did use an inhaler last night which did help.  Patient states the cough is worse at night.  The patient states she does have a history of pneumonia.  Past Medical History:  Diagnosis Date  . Asthma   . Hypertension   . Pleurisy   . PNA (pneumonia)      Review of Systems  Constitutional: Positive for activity change, appetite change and fatigue. Negative for fever.  HENT:       See HPI.  Eyes: Negative.   Respiratory: Positive for cough, chest tightness and wheezing.   Cardiovascular: Negative.   Gastrointestinal: Negative.   Skin: Negative.   Allergic/Immunologic: Negative.   Neurological: Positive for headaches. Negative for dizziness, facial asymmetry, weakness, light-headedness and numbness.       Objective:   Physical Exam  Constitutional: She is oriented to person, place, and time. She appears well-developed and well-nourished. No distress.  HENT:  Head: Normocephalic.  Mouth/Throat: Oropharynx is clear and moist.  +bilateral tonsillar hypertrophy at baseline, no erythema or exudates present, airway is not compromised, uvula midline  Neck: Normal range of motion. Neck supple. No JVD present. No tracheal deviation present.  Cardiovascular: Normal rate and regular rhythm.  Pulmonary/Chest: Effort normal. She has wheezes (inspiratory LLL).   Abdominal: Soft. Bowel sounds are normal.  Neurological: She is alert and oriented to person, place, and time. No cranial nerve deficit.  Skin: Skin is warm and dry. Capillary refill takes 2 to 3 seconds.  Psychiatric: She has a normal mood and affect.  Vitals reviewed.      Assessment & Plan:   Exam findings, diagnosis etiology and medication use and indications reviewed with patient. Follow- Up and discharge instructions provided. No emergent/urgent issues found on exam.  Discussed with patient's that due to her underlying history of asthma active wheezing, and persistent coughing, would like to go ahead and treat prophylactically with antibiotics to prevent worsening condition.  Also I felt it necessary to treat the patient with antibiotics to prevent worsening condition or hospitalization.  Patient education was provided. Patient verbalized understanding of information provided and agrees with plan of care (POC), all questions answered. The patient is advised to call or return to clinic if condition does not see an improvement in symptoms, or to seek the care of the closest emergency department if condition worsens with the above plan.   1. Upper respiratory tract infection, unspecified type  - azithromycin (ZITHROMAX) 250 MG tablet; Take as directed.  Dispense: 6 tablet; Refill: 0 - promethazine-dextromethorphan (PROMETHAZINE-DM) 6.25-15 MG/5ML syrup; Take 5 mLs by mouth 4 (four) times daily as needed for up to 5 days for cough.  Dispense: 100 mL; Refill: 0 - benzonatate (TESSALON) 200 MG capsule; Take 1 capsule (200 mg total) by mouth 3 (three) times daily as needed for up to  10 days for cough.  Dispense: 30 capsule; Refill: 0 -Take medication as prescribed. -Ibuprofen or Tylenol for pain, fever, or general discomfort. -Increase fluids. -Sleep elevated on at least 2 pillows at bedtime to help with cough. -Use a humidifier or vaporizer when at home and during sleep to help with  cough. -May use a teaspoon of honey or over-the-counter cough drops to help with cough. -Follow-up if symptoms do not improve.\  2. Wheezing  - albuterol (PROVENTIL HFA;VENTOLIN HFA) 108 (90 Base) MCG/ACT inhaler; Inhale 2 puffs into the lungs every 6 (six) hours as needed for up to 10 days for wheezing or shortness of breath.  Dispense: 1 Inhaler; Refill: 0 - predniSONE (STERAPRED UNI-PAK 21 TAB) 10 MG (21) TBPK tablet; Take as directed.  Dispense: 21 tablet; Refill: 0 -Take medication as prescribed. -Ibuprofen or Tylenol for pain, fever, or general discomfort. -Increase fluids. -Sleep elevated on at least 2 pillows at bedtime to help with cough. -Use a humidifier or vaporizer when at home and during sleep to help with cough. -May use a teaspoon of honey or over-the-counter cough drops to help with cough. -Follow-up if symptoms do not improve.

## 2018-10-12 NOTE — Patient Instructions (Signed)
Upper Respiratory Infection, Adult -Take medication as prescribed. -Ibuprofen or Tylenol for pain, fever, or general discomfort. -Increase fluids. -Sleep elevated on at least 2 pillows at bedtime to help with cough. -Use a humidifier or vaporizer when at home and during sleep to help with cough. -May use a teaspoon of honey or over-the-counter cough drops to help with cough. -Follow-up if symptoms do not improve.   Most upper respiratory infections (URIs) are a viral infection of the air passages leading to the lungs. A URI affects the nose, throat, and upper air passages. The most common type of URI is nasopharyngitis and is typically referred to as "the common cold." URIs run their course and usually go away on their own. Most of the time, a URI does not require medical attention, but sometimes a bacterial infection in the upper airways can follow a viral infection. This is called a secondary infection. Sinus and middle ear infections are common types of secondary upper respiratory infections. Bacterial pneumonia can also complicate a URI. A URI can worsen asthma and chronic obstructive pulmonary disease (COPD). Sometimes, these complications can require emergency medical care and may be life threatening. What are the causes? Almost all URIs are caused by viruses. A virus is a type of germ and can spread from one person to another. What increases the risk? You may be at risk for a URI if:  You smoke.  You have chronic heart or lung disease.  You have a weakened defense (immune) system.  You are very young or very old.  You have nasal allergies or asthma.  You work in crowded or poorly ventilated areas.  You work in health care facilities or schools.  What are the signs or symptoms? Symptoms typically develop 2-3 days after you come in contact with a cold virus. Most viral URIs last 7-10 days. However, viral URIs from the influenza virus (flu virus) can last 14-18 days and are  typically more severe. Symptoms may include:  Runny or stuffy (congested) nose.  Sneezing.  Cough.  Sore throat.  Headache.  Fatigue.  Fever.  Loss of appetite.  Pain in your forehead, behind your eyes, and over your cheekbones (sinus pain).  Muscle aches.  How is this diagnosed? Your health care provider may diagnose a URI by:  Physical exam.  Tests to check that your symptoms are not due to another condition such as: ? Strep throat. ? Sinusitis. ? Pneumonia. ? Asthma.  How is this treated? A URI goes away on its own with time. It cannot be cured with medicines, but medicines may be prescribed or recommended to relieve symptoms. Medicines may help:  Reduce your fever.  Reduce your cough.  Relieve nasal congestion.  Follow these instructions at home:  Take medicines only as directed by your health care provider.  Gargle warm saltwater or take cough drops to comfort your throat as directed by your health care provider.  Use a warm mist humidifier or inhale steam from a shower to increase air moisture. This may make it easier to breathe.  Drink enough fluid to keep your urine clear or pale yellow.  Eat soups and other clear broths and maintain good nutrition.  Rest as needed.  Return to work when your temperature has returned to normal or as your health care provider advises. You may need to stay home longer to avoid infecting others. You can also use a face mask and careful hand washing to prevent spread of the virus.  Increase the  usage of your inhaler if you have asthma.  Do not use any tobacco products, including cigarettes, chewing tobacco, or electronic cigarettes. If you need help quitting, ask your health care provider. How is this prevented? The best way to protect yourself from getting a cold is to practice good hygiene.  Avoid oral or hand contact with people with cold symptoms.  Wash your hands often if contact occurs.  There is no clear  evidence that vitamin C, vitamin E, echinacea, or exercise reduces the chance of developing a cold. However, it is always recommended to get plenty of rest, exercise, and practice good nutrition. Contact a health care provider if:  You are getting worse rather than better.  Your symptoms are not controlled by medicine.  You have chills.  You have worsening shortness of breath.  You have brown or red mucus.  You have yellow or brown nasal discharge.  You have pain in your face, especially when you bend forward.  You have a fever.  You have swollen neck glands.  You have pain while swallowing.  You have white areas in the back of your throat. Get help right away if:  You have severe or persistent: ? Headache. ? Ear pain. ? Sinus pain. ? Chest pain.  You have chronic lung disease and any of the following: ? Wheezing. ? Prolonged cough. ? Coughing up blood. ? A change in your usual mucus.  You have a stiff neck.  You have changes in your: ? Vision. ? Hearing. ? Thinking. ? Mood. This information is not intended to replace advice given to you by your health care provider. Make sure you discuss any questions you have with your health care provider. Document Released: 04/21/2001 Document Revised: 06/28/2016 Document Reviewed: 01/31/2014 Elsevier Interactive Patient Education  2018 [employer].  Bronchospasm, Adult Bronchospasm is a tightening of the airways going into the lungs. During an episode, it may be harder to breathe. You may cough, and you may make a whistling sound when you breathe (wheeze). This condition often affects people with asthma. What are the causes? This condition is caused by swelling and irritation in the airways. It can be triggered by:  An infection (common).  Seasonal allergies.  An allergic reaction.  Exercise.  Irritants. These include pollution, cigarette smoke, strong odors, aerosol sprays, and paint fumes.  Weather changes.  Winds increase molds and pollens in the air. Cold air may cause swelling.  Stress and emotional upset.  What are the signs or symptoms? Symptoms of this condition include:  Wheezing. If the episode was triggered by an allergy, wheezing may start right away or hours later.  Nighttime coughing.  Frequent or severe coughing with a simple cold.  Chest tightness.  Shortness of breath.  Decreased ability to exercise.  How is this diagnosed? This condition is usually diagnosed with a review of your medical history and a physical exam. Tests, such as lung function tests, are sometimes done to look for other conditions. The need for a chest X-ray depends on where the wheezing occurs and whether it is the first time you have wheezed. How is this treated? This condition may be treated with:  Inhaled medicines. These open up the airways and help you breathe. They can be taken with an inhaler or a nebulizer device.  Corticosteroid medicines. These may be given for severe bronchospasm, usually when it is associated with asthma.  Avoiding triggers, such as irritants, infection, or allergies.  Follow these instructions at home: Medicines  Take over-the-counter and prescription medicines only as told by your health care provider.  If you need to use an inhaler or nebulizer to take your medicine, ask your health care provider to explain how to use it correctly. If you were given a spacer, always use it with your inhaler. Lifestyle  Reduce the number of triggers in your home. To do this: ? Change your heating and air conditioning filter at least once a month. ? Limit your use of fireplaces and wood stoves. ? Do not smoke. Do not allow smoking in your home. ? Avoid using perfumes and fragrances. ? Get rid of pests, such as roaches and mice, and their droppings. ? Remove any mold from your home. ? Keep your house clean and dust free. Use unscented cleaning products. ? Replace carpet with  wood, tile, or vinyl flooring. Carpet can trap dander and dust. ? Use allergy-proof pillows, mattress covers, and box spring covers. ? Wash bed sheets and blankets every week in hot water. Dry them in a dryer. ? Use blankets that are made of polyester or cotton. ? Wash your hands often. ? Do not allow pets in your bedroom.  Avoid breathing in cold air when you exercise. General instructions  Have a plan for seeking medical care. Know when to call your health care provider and local emergency services, and where to get emergency care.  Stay up to date on your immunizations.  When you have an episode of bronchospasm, stay calm. Try to relax and breathe more slowly.  If you have asthma, make sure you have an asthma action plan.  Keep all follow-up visits as told by your health care provider. This is important. Contact a health care provider if:  You have muscle aches.  You have chest pain.  The mucus that you cough up (sputum) changes from clear or white to yellow, green, gray, or bloody.  You have a fever.  Your sputum gets thicker. Get help right away if:  Your wheezing and coughing get worse, even after you take your prescribed medicines.  It gets even harder to breathe.  You develop severe chest pain. Summary  Bronchospasm is a tightening of the airways going into the lungs.  During an episode of bronchospasm, you may have a harder time breathing. You may cough and make a whistling sound when you breathe (wheeze).  Avoid exposure to triggers such as smoke, dust, mold, animal dander, and fragrances.  When you have an episode of bronchospasm, stay calm. Try to relax and breathe more slowly. This information is not intended to replace advice given to you by your health care provider. Make sure you discuss any questions you have with your health care provider. Document Released: 10/29/2003 Document Revised: 10/22/2016 Document Reviewed: 10/22/2016 Elsevier Interactive  Patient Education  2017 [employer].

## 2018-10-21 MED FILL — TRI-SPRINTEC TABLET: 0.18/0.215/ | 84 days supply | Qty: 84 | Fill #4

## 2018-10-28 ENCOUNTER — Encounter: Payer: Self-pay | Admitting: Physician Assistant

## 2018-10-28 ENCOUNTER — Other Ambulatory Visit: Payer: Self-pay | Admitting: Physician Assistant

## 2018-10-28 DIAGNOSIS — I10 Essential (primary) hypertension: Secondary | ICD-10-CM

## 2018-10-28 MED FILL — LISINOPRIL-HCTZ 10-12.5 TAB: 10-12.5 | 90 days supply | Qty: 90 | Fill #0

## 2018-12-13 ENCOUNTER — Encounter: Payer: 59 | Admitting: Physician Assistant

## 2019-01-18 ENCOUNTER — Encounter: Payer: Self-pay | Admitting: Physician Assistant

## 2019-01-18 ENCOUNTER — Ambulatory Visit (INDEPENDENT_AMBULATORY_CARE_PROVIDER_SITE_OTHER): Payer: Self-pay | Admitting: Physician Assistant

## 2019-01-18 ENCOUNTER — Other Ambulatory Visit: Payer: Self-pay

## 2019-01-18 VITALS — BP 121/67 | HR 80 | Temp 97.8°F | Ht 66.5 in | Wt 230.0 lb

## 2019-01-18 DIAGNOSIS — I10 Essential (primary) hypertension: Secondary | ICD-10-CM

## 2019-01-18 DIAGNOSIS — Z0000 Encounter for general adult medical examination without abnormal findings: Secondary | ICD-10-CM

## 2019-01-18 DIAGNOSIS — Z6833 Body mass index (BMI) 33.0-33.9, adult: Secondary | ICD-10-CM

## 2019-01-18 DIAGNOSIS — E6609 Other obesity due to excess calories: Secondary | ICD-10-CM

## 2019-01-18 MED ORDER — PHENTERMINE-TOPIRAMATE ER 7.5-46 MG PO CP24: 1.0000 | ORAL_CAPSULE | Freq: Every morning | ORAL | 1 refills | Status: DC

## 2019-01-18 MED ORDER — LISINOPRIL-HYDROCHLOROTHIAZIDE 10-12.5 MG PO TABS: 1.0000 | ORAL_TABLET | Freq: Every day | ORAL | 3 refills | Status: DC

## 2019-01-18 NOTE — Progress Notes (Signed)
Subjective:     Kayla Campos is a 33 y.o. female and is here for a comprehensive physical exam. The patient reports problems - she continues to be very frustrated with her weight. she works out in [employer] 6 days a week. she admits at times she does eat more at one time than she should. she will do really good all day and then bam eat a whole bag of chips. .  Pt had labs and pap done at GYN. Started on metformin for insulin resistance.    Social History   Socioeconomic History  . Marital status: Married    Spouse name: Not on file  . Number of children: Not on file  . Years of education: Not on file  . Highest education level: Not on file  Occupational History  . Not on file  Social Needs  . Financial resource strain: Not on file  . Food insecurity:    Worry: Not on file    Inability: Not on file  . Transportation needs:    Medical: Not on file    Non-medical: Not on file  Tobacco Use  . Smoking status: Never Smoker  . Smokeless tobacco: Never Used  Substance and Sexual Activity  . Alcohol use: No  . Drug use: No  . Sexual activity: Yes  Lifestyle  . Physical activity:    Days per week: Not on file    Minutes per session: Not on file  . Stress: Not on file  Relationships  . Social connections:    Talks on phone: Not on file    Gets together: Not on file    Attends religious service: Not on file    Active member of club or organization: Not on file    Attends meetings of clubs or organizations: Not on file    Relationship status: Not on file  . Intimate partner violence:    Fear of current or ex partner: Not on file    Emotionally abused: Not on file    Physically abused: Not on file    Forced sexual activity: Not on file  Other Topics Concern  . Not on file  Social History Narrative  . Not on file   Health Maintenance  Topic Date Due  . INFLUENZA VACCINE  02/07/2019 (Originally 06/09/2018)  . HIV Screening  01/18/2020 (Originally 03/09/2000)  . PAP  SMEAR-Modifier  10/10/2019  . TETANUS/TDAP  02/20/2023    The following portions of the patient's history were reviewed and updated as appropriate: allergies, current medications, past family history, past medical history, past social history, past surgical history and problem list.  Review of Systems A comprehensive review of systems was negative.   Objective:    BP 121/67   Pulse 80   Temp 97.8 F (36.6 C) (Oral)   Ht 5' 6.5" (1.689 m)   Wt 230 lb (104.3 kg)   BMI 36.57 kg/m  General appearance: alert, cooperative, appears stated age and moderately obese Head: Normocephalic, without obvious abnormality, atraumatic Eyes: conjunctivae/corneas clear. PERRL, EOM's intact. Fundi benign. Ears: normal TM's and external ear canals both ears Nose: Nares normal. Septum midline. Mucosa normal. No drainage or sinus tenderness. Throat: lips, mucosa, and tongue normal; teeth and gums normal Neck: no adenopathy, no carotid bruit, no JVD, supple, symmetrical, trachea midline and thyroid not enlarged, symmetric, no tenderness/mass/nodules Back: symmetric, no curvature. ROM normal. No CVA tenderness. Lungs: clear to auscultation bilaterally Heart: regular rate and rhythm, S1, S2 normal, no murmur,  click, rub or gallop Abdomen: soft, non-tender; bowel sounds normal; no masses,  no organomegaly Extremities: extremities normal, atraumatic, no cyanosis or edema Pulses: 2+ and symmetric Skin: Skin color, texture, turgor normal. No rashes or lesions Lymph nodes: Cervical, supraclavicular, and axillary nodes normal. Neurologic: Alert and oriented X 3, normal strength and tone. Normal symmetric reflexes. Normal coordination and gait    Assessment:    Healthy female exam.      Plan:    .Marland KitchenTeresita was seen today for annual exam.  Diagnoses and all orders for this visit:  Routine physical examination  Essential hypertension, benign -     lisinopril-hydrochlorothiazide (PRINZIDE,ZESTORETIC)  10-12.5 MG tablet; Take 1 tablet by mouth daily.  Class 1 obesity due to excess calories without serious comorbidity with body mass index (BMI) of 33.0 to 33.9 in adult  Other orders -     Discontinue: Phentermine-Topiramate 7.5-46 MG CP24; Take 1 tablet by mouth every morning.   .. Depression screen [facility] 2/9 01/23/2019 08/02/2017 10/11/2013  Decreased Interest 0 0 0  Down, Depressed, Hopeless 0 0 0  PHQ - 2 Score 0 0 0   .Marland Kitchen Discussed 150 minutes of exercise a week.  Encouraged vitamin D 1000 units and Calcium 1300mg  or 4 servings of dairy a day.  Labs done. Will request copy.  Pap done. Will request copy.  Vaccines up to date.   Marland Kitchen.Discussed low carb diet with 1500 calories and 80g of protein.  Exercising at least 150 minutes a week.  My Fitness Pal could be a Microbiologist.  Metformin could help with weight loss. She has done well with phentermine in the past but always gained the weight back.  Will try qsymia. Discussed side effects.  Follow up in 1 month.  See After Visit Summary for Counseling Recommendations

## 2019-01-20 MED ORDER — TOPIRAMATE 50 MG PO TABS: ORAL_TABLET | ORAL | 2 refills | Status: DC

## 2019-01-23 ENCOUNTER — Encounter: Payer: Self-pay | Admitting: Physician Assistant

## 2019-01-23 MED ORDER — PHENTERMINE HCL 15 MG PO CAPS: 15.0000 mg | ORAL_CAPSULE | ORAL | 0 refills | Status: DC

## 2019-02-10 ENCOUNTER — Encounter: Payer: Self-pay | Admitting: Physician Assistant

## 2019-02-14 NOTE — Telephone Encounter (Signed)
I tried to call patient, no answer and on voicemail. [facility] is now requiring staff to wear a mask.

## 2019-02-15 NOTE — Telephone Encounter (Addendum)
Tried to call patient again, no answer and unable to leave VM. Sent pt a msg via [place] asking if this information is still needed now that [facility] is implementing everyone wearing a mask

## 2019-02-20 ENCOUNTER — Ambulatory Visit: Payer: Self-pay | Admitting: Physician Assistant

## 2019-04-06 ENCOUNTER — Encounter: Payer: Self-pay | Admitting: Family Medicine

## 2019-04-06 ENCOUNTER — Ambulatory Visit (INDEPENDENT_AMBULATORY_CARE_PROVIDER_SITE_OTHER): Payer: 59 | Admitting: Family Medicine

## 2019-04-06 ENCOUNTER — Encounter: Payer: Self-pay | Admitting: Physician Assistant

## 2019-04-06 VITALS — HR 84 | Temp 102.0°F | Ht 65.5 in | Wt 220.0 lb

## 2019-04-06 DIAGNOSIS — J039 Acute tonsillitis, unspecified: Secondary | ICD-10-CM

## 2019-04-06 DIAGNOSIS — J029 Acute pharyngitis, unspecified: Secondary | ICD-10-CM

## 2019-04-06 MED ORDER — AMOXICILLIN 500 MG PO TABS: 500.0000 mg | ORAL_TABLET | Freq: Two times a day (BID) | ORAL | 0 refills | Status: AC

## 2019-04-06 MED ORDER — FLUCONAZOLE 150 MG PO TABS: 150.0000 mg | ORAL_TABLET | Freq: Once | ORAL | 1 refills | Status: AC

## 2019-04-06 NOTE — Progress Notes (Signed)
Virtual Visit via Video Note  I connected with [name] on 04/06/19 at  2:40 PM EDT by a video enabled telemedicine application and verified that I am speaking with the correct person using two identifiers.   I discussed the limitations of evaluation and management by telemedicine and the availability of in person appointments. The patient expressed understanding and agreed to proceed.  Pt was at home and I was in my office for the virtual visit.     Subjective:    CC: Sore throat  HPI: 33 year old female is doing virtual visit today for painful sore throat. Started last night suddenly. Says feels like when she has had tonsillitis.  She has had a high fever up to 102 and says usually only gets a high fever when she gets strep.  Feer now 101.4.  Using tylenol 1000 mg. No nausea or vomiting. There LN are swollen.  + HA. No nasal congestion. No cough.  No GI upset.  She works at [employer] but says that she has not had any known exposures to [place] in the last several weeks.  Past medical history, Surgical history, Family history not pertinant except as noted below, Social history, Allergies, and medications have been entered into the medical record, reviewed, and corrections made.   Review of Systems: No fevers, chills, night sweats, weight loss, chest pain, or shortness of breath.   Objective:    General: Speaking clearly in complete sentences without any shortness of breath.  Alert and oriented x3.  Normal judgment. No apparent acute distress.  Well-groomed.  Sitting in bed.    Impression and Recommendations:   Tonsillitis/pharyngitis-possible strep throat.  We will go ahead and treat with amoxicillin since she feels like this is very consistent with prior infections.  If not improving after the weekend then please let us know we will likely need to see her in person.  She did send some photos via my chart of quite swollen and erythematous tonsils.  I was unable to  tell if there is any exudate present.  Even though she does have an allergy to Keflex I did confirm with her that she has taken penicillin on multiple occasions including amoxicillin and has done well with it.  Make sure hydrating well.  Recommend alternating her Tylenol with ibuprofen so that she does not take more than 3 g total of Tylenol daily as she is already taken 2000 mg today.  Her COVID testing will hopefully be back by Monday via work.    I discussed the assessment and treatment plan with the patient. The patient was provided an opportunity to ask questions and all were answered. The patient agreed with the plan and demonstrated an understanding of the instructions.   The patient was advised to call back or seek an in-person evaluation if the symptoms worsen or if the condition fails to improve as anticipated.   Beatrice Lecher, MD

## 2019-04-07 ENCOUNTER — Telehealth: Payer: 59 | Admitting: Physician Assistant

## 2019-04-07 ENCOUNTER — Encounter: Payer: Self-pay | Admitting: Physician Assistant

## 2019-11-14 ENCOUNTER — Telehealth: Payer: Self-pay | Admitting: Physician Assistant

## 2019-11-14 DIAGNOSIS — L309 Dermatitis, unspecified: Secondary | ICD-10-CM

## 2019-11-14 DIAGNOSIS — L80 Vitiligo: Secondary | ICD-10-CM

## 2019-11-14 DIAGNOSIS — L659 Nonscarring hair loss, unspecified: Secondary | ICD-10-CM

## 2019-11-14 MED FILL — TRI-PREVIFEM 0.18/0.215/0.2: 0.18/0.215/ | 84 days supply | Qty: 84 | Fill #0

## 2019-11-14 NOTE — Telephone Encounter (Signed)
Referral placed.

## 2019-11-14 NOTE — Addendum Note (Signed)
Addended byAnnamaria Helling on: 11/14/2019 11:31 AM   Modules accepted: Orders

## 2019-11-14 NOTE — Telephone Encounter (Signed)
PT requested a referral to  [facility] [address] [place],    [address] Get Driving Directions Main: [phone]

## 2019-11-14 NOTE — Addendum Note (Signed)
Addended byAnnamaria Helling on: 11/14/2019 04:30 PM   Modules accepted: Orders

## 2019-11-14 NOTE — Telephone Encounter (Signed)
Ok for referral may need to call for diagnoses to associate.

## 2019-11-14 NOTE — Telephone Encounter (Signed)
Referral pended. Left message on machine for patient to call back to let us know why she needs referral.

## 2019-11-28 MED FILL — TRI FEMYNOR 28 TABLET: 0.18/0.215/ | 84 days supply | Qty: 84 | Fill #0

## 2019-11-30 ENCOUNTER — Encounter: Payer: 59 | Admitting: Sports Medicine

## 2019-12-12 ENCOUNTER — Encounter: Payer: Self-pay | Admitting: Physician Assistant

## 2019-12-12 ENCOUNTER — Other Ambulatory Visit: Payer: Self-pay

## 2019-12-12 ENCOUNTER — Ambulatory Visit (INDEPENDENT_AMBULATORY_CARE_PROVIDER_SITE_OTHER): Payer: No Typology Code available for payment source | Admitting: Physician Assistant

## 2019-12-12 VITALS — BP 128/72 | HR 92 | Ht 66.5 in | Wt 264.0 lb

## 2019-12-12 DIAGNOSIS — J3089 Other allergic rhinitis: Secondary | ICD-10-CM | POA: Insufficient documentation

## 2019-12-12 DIAGNOSIS — Z0000 Encounter for general adult medical examination without abnormal findings: Secondary | ICD-10-CM

## 2019-12-12 DIAGNOSIS — R062 Wheezing: Secondary | ICD-10-CM | POA: Insufficient documentation

## 2019-12-12 DIAGNOSIS — I10 Essential (primary) hypertension: Secondary | ICD-10-CM

## 2019-12-12 DIAGNOSIS — Z131 Encounter for screening for diabetes mellitus: Secondary | ICD-10-CM

## 2019-12-12 DIAGNOSIS — F5081 Binge eating disorder: Secondary | ICD-10-CM | POA: Insufficient documentation

## 2019-12-12 DIAGNOSIS — Z13220 Encounter for screening for lipoid disorders: Secondary | ICD-10-CM

## 2019-12-12 MED ORDER — SAXENDA 18 MG/3ML ~~LOC~~ SOPN: 0.6000 mg | PEN_INJECTOR | Freq: Every day | SUBCUTANEOUS | 1 refills | Status: DC

## 2019-12-12 MED ORDER — ALBUTEROL SULFATE HFA 108 (90 BASE) MCG/ACT IN AERS: 2.0000 | INHALATION_SPRAY | Freq: Four times a day (QID) | RESPIRATORY_TRACT | 1 refills | Status: DC | PRN

## 2019-12-12 MED ORDER — LISINOPRIL-HYDROCHLOROTHIAZIDE 10-12.5 MG PO TABS: 1.0000 | ORAL_TABLET | Freq: Every day | ORAL | 3 refills | Status: DC

## 2019-12-12 MED ORDER — MONTELUKAST SODIUM 10 MG PO TABS: 10.0000 mg | ORAL_TABLET | Freq: Every day | ORAL | 3 refills | Status: DC

## 2019-12-12 MED ORDER — LISDEXAMFETAMINE DIMESYLATE 10 MG PO CAPS: 10.0000 mg | ORAL_CAPSULE | Freq: Every day | ORAL | 0 refills | Status: DC

## 2019-12-12 MED FILL — VYVANSE 10 MG CAPSULE: 10 | 30 days supply | Qty: 30 | Fill #0

## 2019-12-12 MED FILL — LISINOPRIL-HCTZ 10-12.5 MG: 10-12.5 | 90 days supply | Qty: 90 | Fill #0

## 2019-12-12 MED FILL — MONTELUKAST SOD 10 MG TAB: 10 | 90 days supply | Qty: 90 | Fill #0

## 2019-12-12 MED FILL — ALBUTEROL SULFATE HFA 108 (: 108 (90 BAS | 25 days supply | Qty: 18 | Fill #0

## 2019-12-12 NOTE — Patient Instructions (Signed)
Health Maintenance, Female Adopting a healthy lifestyle and getting preventive care are important in promoting health and wellness. Ask your health care provider about:  The right schedule for you to have regular tests and exams.  Things you can do on your own to prevent diseases and keep yourself healthy. What should I know about diet, weight, and exercise? Eat a healthy diet   Eat a diet that includes plenty of vegetables, fruits, low-fat dairy products, and lean protein.  Do not eat a lot of foods that are high in solid fats, added sugars, or sodium. Maintain a healthy weight Body mass index (BMI) is used to identify weight problems. It estimates body fat based on height and weight. Your health care provider can help determine your BMI and help you achieve or maintain a healthy weight. Get regular exercise Get regular exercise. This is one of the most important things you can do for your health. Most adults should:  Exercise for at least 150 minutes each week. The exercise should increase your heart rate and make you sweat (moderate-intensity exercise).  Do strengthening exercises at least twice a week. This is in addition to the moderate-intensity exercise.  Spend less time sitting. Even light physical activity can be beneficial. Watch cholesterol and blood lipids Have your blood tested for lipids and cholesterol at 35 years of age, then have this test every 5 years. Have your cholesterol levels checked more often if:  Your lipid or cholesterol levels are high.  You are older than 35 years of age.  You are at high risk for heart disease. What should I know about cancer screening? Depending on your health history and family history, you may need to have cancer screening at various ages. This may include screening for:  Breast cancer.  Cervical cancer.  Colorectal cancer.  Skin cancer.  Lung cancer. What should I know about heart disease, diabetes, and high blood  pressure? Blood pressure and heart disease  High blood pressure causes heart disease and increases the risk of stroke. This is more likely to develop in people who have high blood pressure readings, are of African descent, or are overweight.  Have your blood pressure checked: ? Every 3-5 years if you are 18-39 years of age. ? Every year if you are 40 years old or older. Diabetes Have regular diabetes screenings. This checks your fasting blood sugar level. Have the screening done:  Once every three years after age 40 if you are at a normal weight and have a low risk for diabetes.  More often and at a younger age if you are overweight or have a high risk for diabetes. What should I know about preventing infection? Hepatitis B If you have a higher risk for hepatitis B, you should be screened for this virus. Talk with your health care provider to find out if you are at risk for hepatitis B infection. Hepatitis C Testing is recommended for:  Everyone born from 1945 through 1965.  Anyone with known risk factors for hepatitis C. Sexually transmitted infections (STIs)  Get screened for STIs, including gonorrhea and chlamydia, if: ? You are sexually active and are younger than 35 years of age. ? You are older than 35 years of age and your health care provider tells you that you are at risk for this type of infection. ? Your sexual activity has changed since you were last screened, and you are at increased risk for chlamydia or gonorrhea. Ask your health care provider if   you are at risk.  Ask your health care provider about whether you are at high risk for HIV. Your health care provider may recommend a prescription medicine to help prevent HIV infection. If you choose to take medicine to prevent HIV, you should first get tested for HIV. You should then be tested every 3 months for as long as you are taking the medicine. Pregnancy  If you are about to stop having your period (premenopausal) and  you may become pregnant, seek counseling before you get pregnant.  Take 400 to 800 micrograms (mcg) of folic acid every day if you become pregnant.  Ask for birth control (contraception) if you want to prevent pregnancy. Osteoporosis and menopause Osteoporosis is a disease in which the bones lose minerals and strength with aging. This can result in bone fractures. If you are 65 years old or older, or if you are at risk for osteoporosis and fractures, ask your health care provider if you should:  Be screened for bone loss.  Take a calcium or vitamin D supplement to lower your risk of fractures.  Be given hormone replacement therapy (HRT) to treat symptoms of menopause. Follow these instructions at home: Lifestyle  Do not use any products that contain nicotine or tobacco, such as cigarettes, e-cigarettes, and chewing tobacco. If you need help quitting, ask your health care provider.  Do not use street drugs.  Do not share needles.  Ask your health care provider for help if you need support or information about quitting drugs. Alcohol use  Do not drink alcohol if: ? Your health care provider tells you not to drink. ? You are pregnant, may be pregnant, or are planning to become pregnant.  If you drink alcohol: ? Limit how much you use to 0-1 drink a day. ? Limit intake if you are breastfeeding.  Be aware of how much alcohol is in your drink. In the [place] one drink equals one 12 oz bottle of beer (355 mL), one 5 oz glass of wine (148 mL), or one 1 oz glass of hard liquor (44 mL). General instructions  Schedule regular health, dental, and eye exams.  Stay current with your vaccines.  Tell your health care provider if: ? You often feel depressed. ? You have ever been abused or do not feel safe at home. Summary  Adopting a healthy lifestyle and getting preventive care are important in promoting health and wellness.  Follow your health care provider's instructions about healthy  diet, exercising, and getting tested or screened for diseases.  Follow your health care provider's instructions on monitoring your cholesterol and blood pressure. This information is not intended to replace advice given to you by your health care provider. Make sure you discuss any questions you have with your health care provider. Document Revised: 10/19/2018 Document Reviewed: 10/19/2018 Elsevier Patient Education  2020 Elsevier Inc.  

## 2019-12-12 NOTE — Progress Notes (Signed)
l  Subjective:     Kayla Campos is a 34 y.o. female and is here for a comprehensive physical exam. The patient reports problems - see below.    Pt wants to target weight loss. She gained 40lbs in last year. Her whole life she has flucuated between losing and gaining. She often does great and eat perfect but then binges for days or weeks. She is very active and exercises at least 3-4 days a weeks. She knows food is her trigger. She knows binging is huge problem as well. She will he a 2 full meals at time.   Questions allergy to dogs and other things. Zyrtec/allergra just not working anymore. At times she feels like she is SOB and wheezing. Hx of asthma when younger. Lots of sinus congestion,rhinorrhea, itchy/watery eyes.   Social History   Socioeconomic History  . Marital status: Married    Spouse name: Not on file  . Number of children: Not on file  . Years of education: Not on file  . Highest education level: Not on file  Occupational History  . Not on file  Tobacco Use  . Smoking status: Never Smoker  . Smokeless tobacco: Never Used  Substance and Sexual Activity  . Alcohol use: No  . Drug use: No  . Sexual activity: Yes  Other Topics Concern  . Not on file  Social History Narrative  . Not on file   Social Determinants of Health   Financial Resource Strain:   . Difficulty of Paying Living Expenses: Not on file  Food Insecurity:   . Worried About Charity fundraiser in the Last Year: Not on file  . Ran Out of Food in the Last Year: Not on file  Transportation Needs:   . Lack of Transportation (Medical): Not on file  . Lack of Transportation (Non-Medical): Not on file  Physical Activity:   . Days of Exercise per Week: Not on file  . Minutes of Exercise per Session: Not on file  Stress:   . Feeling of Stress : Not on file  Social Connections:   . Frequency of Communication with Friends and Family: Not on file  . Frequency of Social Gatherings with Friends and  Family: Not on file  . Attends Religious Services: Not on file  . Active Member of Clubs or Organizations: Not on file  . Attends Archivist Meetings: Not on file  . Marital Status: Not on file  Intimate Partner Violence:   . Fear of Current or Ex-Partner: Not on file  . Emotionally Abused: Not on file  . Physically Abused: Not on file  . Sexually Abused: Not on file   Health Maintenance  Topic Date Due  . PAP SMEAR-Modifier  12/12/2019 (Originally 10/10/2019)  . HIV Screening  01/18/2020 (Originally 03/09/2000)  . TETANUS/TDAP  02/20/2023  . INFLUENZA VACCINE  Completed    The following portions of the patient's history were reviewed and updated as appropriate: allergies, current medications, past family history, past medical history, past social history, past surgical history and problem list.  Review of Systems Pertinent items noted in HPI and remainder of comprehensive ROS otherwise negative.   Objective:    BP 128/72   Pulse 92   Ht 5' 6.5" (1.689 m)   Wt 264 lb (119.7 kg)   SpO2 98%   BMI 41.97 kg/m  General appearance: alert, cooperative and appears stated age Head: Normocephalic, without obvious abnormality, atraumatic Eyes: conjunctivae/corneas clear. PERRL, EOM's intact.  Fundi benign. Ears: normal TM's and external ear canals both ears Nose: Nares normal. Septum midline. Mucosa normal. No drainage or sinus tenderness. Throat: lips, mucosa, and tongue normal; teeth and gums normal Neck: no adenopathy, no carotid bruit, no JVD, supple, symmetrical, trachea midline and thyroid not enlarged, symmetric, no tenderness/mass/nodules Back: symmetric, no curvature. ROM normal. No CVA tenderness. Lungs: clear to auscultation bilaterally Heart: regular rate and rhythm, S1, S2 normal, no murmur, click, rub or gallop Abdomen: soft, non-tender; bowel sounds normal; no masses,  no organomegaly Extremities: extremities normal, atraumatic, no cyanosis or edema Pulses: 2+  and symmetric Skin: Skin color, texture, turgor normal. No rashes or lesions Lymph nodes: Cervical, supraclavicular, and axillary nodes normal. Neurologic: Alert and oriented X 3, normal strength and tone. Normal symmetric reflexes. Normal coordination and gait    Assessment:    Healthy female exam.      Plan:    .Marland KitchenLeslyn was seen today for annual exam.  Diagnoses and all orders for this visit:  Routine physical examination -     Lipid Panel w/reflex Direct LDL -     CBC -     COMPLETE METABOLIC PANEL WITH GFR -     TSH  Wheezing -     albuterol (VENTOLIN HFA) 108 (90 Base) MCG/ACT inhaler; Inhale 2 puffs into the lungs every 6 (six) hours as needed for up to 10 days for wheezing or shortness of breath.  Morbid obesity (HCC) -     Liraglutide -Weight Management (SAXENDA) 18 MG/3ML SOPN; Inject 0.6 mg into the skin daily. For one week then increase by .6mg  weekly until reaches 3mg  daily.  Please include ultra fine needles 82mm -     TSH  Essential hypertension, benign -     CBC -     lisinopril-hydrochlorothiazide (ZESTORETIC) 10-12.5 MG tablet; Take 1 tablet by mouth daily.  Screening for diabetes mellitus -     COMPLETE METABOLIC PANEL WITH GFR  Screening for lipid disorders -     Lipid Panel w/reflex Direct LDL  Binge-eating disorder, mild -     lisdexamfetamine (VYVANSE) 10 MG capsule; Take 1 capsule (10 mg total) by mouth daily.  Environmental and seasonal allergies -     montelukast (SINGULAIR) 10 MG tablet; Take 1 tablet (10 mg total) by mouth at bedtime. -     Ambulatory referral to Allergy   .Marland KitchenCerumen Removal Template: Indication: Cerumen impaction of the ear(s) Medical necessity statement: On physical examination, cerumen impairs clinically significant portions of the external auditory canal, and tympanic membrane. Noted obstructive, copious cerumen that cannot be removed without magnification and instrumentations requiring physician skills Consent:  Discussed benefits and risks of procedure and verbal consent obtained Procedure: Patient was prepped for the procedure. Utilized an otoscope to assess and take note of the ear canal, the tympanic membrane, and the presence, amount, and placement of the cerumen. Gentle water irrigation and soft plastic curette was utilized to remove cerumen.  Post procedure examination shows cerumen was completely removed. Patient tolerated procedure well. The patient is made aware that they may experience temporary vertigo, temporary hearing loss, and temporary discomfort. If these symptom last for more than 24 hours to call the clinic or proceed to the ED.  .. Depression screen [facility] 2/9 12/12/2019 01/23/2019 08/02/2017 10/11/2013  Decreased Interest 0 0 0 0  Down, Depressed, Hopeless 1 0 0 0  PHQ - 2 Score 1 0 0 0  Altered sleeping 1 - - -  Tired, decreased  energy 1 - - -  Change in appetite 1 - - -  Feeling bad or failure about yourself  1 - - -  Trouble concentrating 1 - - -  Moving slowly or fidgety/restless 0 - - -  Suicidal thoughts 0 - - -  PHQ-9 Score 6 - - -  Difficult doing work/chores Not difficult at all - - -   .Marland Kitchen Discussed 150 minutes of exercise a week.  Encouraged vitamin D 1000 units and Calcium 1300mg  or 4 servings of dairy a day.  Fasting labs ordered.  Pap and mammogram UTD and sees GYN.  Flu and covid vaccine UTD.   Marland Kitchen.Discussed low carb diet with 1500 calories and 80g of protein.  Exercising at least 150 minutes a week.  My Fitness Pal could be a Microbiologist.  Start saxenda. Discussed side effect. Discussed taper.  Start vyvanse for binge eating. Discussed side effects.  Follow up in 1 month.   No wheezing on exam. Hx of asthma. Albuterol as needed. Added singulair to zyrtec. Pt requested referral to allergy.   See After Visit Summary for Counseling Recommendations

## 2019-12-13 ENCOUNTER — Encounter: Payer: Self-pay | Admitting: Physician Assistant

## 2019-12-13 ENCOUNTER — Telehealth: Payer: Self-pay | Admitting: Physician Assistant

## 2019-12-13 LAB — CBC
HCT: 38.4 % (ref 35.0–45.0)
Hemoglobin: 13 g/dL (ref 11.7–15.5)
MCH: 29.5 pg (ref 27.0–33.0)
MCHC: 33.9 g/dL (ref 32.0–36.0)
MCV: 87.3 fL (ref 80.0–100.0)
MPV: 9.9 fL (ref 7.5–12.5)
Platelets: 297 10*3/uL (ref 140–400)
RBC: 4.4 10*6/uL (ref 3.80–5.10)
RDW: 12.4 % (ref 11.0–15.0)
WBC: 12.3 10*3/uL — ABNORMAL HIGH (ref 3.8–10.8)

## 2019-12-13 LAB — COMPLETE METABOLIC PANEL WITH GFR
AG Ratio: 1.3 (calc) (ref 1.0–2.5)
ALT: 15 U/L (ref 6–29)
AST: 21 U/L (ref 10–30)
Albumin: 4 g/dL (ref 3.6–5.1)
Alkaline phosphatase (APISO): 46 U/L (ref 31–125)
BUN: 16 mg/dL (ref 7–25)
CO2: 27 mmol/L (ref 20–32)
Calcium: 10 mg/dL (ref 8.6–10.2)
Chloride: 101 mmol/L (ref 98–110)
Creat: 0.64 mg/dL (ref 0.50–1.10)
GFR, Est African American: 135 mL/min/{1.73_m2} (ref >=60)
GFR, Est Non African American: 116 mL/min/{1.73_m2} (ref >=60)
Globulin: 3.1 g/dL (calc) (ref 1.9–3.7)
Glucose, Bld: 86 mg/dL (ref 65–99)
Potassium: 4.3 mmol/L (ref 3.5–5.3)
Sodium: 140 mmol/L (ref 135–146)
Total Bilirubin: 0.3 mg/dL (ref 0.2–1.2)
Total Protein: 7.1 g/dL (ref 6.1–8.1)

## 2019-12-13 LAB — LIPID PANEL W/REFLEX DIRECT LDL
Cholesterol: 242 mg/dL — ABNORMAL HIGH (ref <200)
HDL: 71 mg/dL (ref >=50)
LDL Cholesterol (Calc): 149 mg/dL (calc) — ABNORMAL HIGH
Non-HDL Cholesterol (Calc): 171 mg/dL (calc) — ABNORMAL HIGH (ref <130)
Total CHOL/HDL Ratio: 3.4 (calc) (ref <5.0)
Triglycerides: 105 mg/dL (ref <150)

## 2019-12-13 LAB — TSH: TSH: 0.68 mIU/L

## 2019-12-13 NOTE — Telephone Encounter (Signed)
Received fax for PA on Saxenda sent through cover my meds waiting on determination. - CF 

## 2019-12-13 NOTE — Progress Notes (Signed)
[place],   Cholesterol worsened. LDL up from 109 to 149. HDL is good. Lets work on weight loss first I suspect will come down with that.  Thyroid normal.  Kidney, liver, glucose look great.  WBC is up. No other abnormalities. Could be allergies/right a virus.

## 2019-12-20 MED FILL — TECHLITE PEN NDL 32GX1/4: 32G X 6 MM | 90 days supply | Qty: 100 | Fill #0

## 2019-12-20 MED FILL — SAXENDA 18 MG/3 ML PEN: 18 | 30 days supply | Qty: 12 | Fill #0

## 2019-12-25 ENCOUNTER — Telehealth: Payer: Self-pay | Admitting: Physician Assistant

## 2019-12-25 NOTE — Telephone Encounter (Signed)
Received fax stating Kirke Shaggy was approved for a maximum of 4 fills from 12/11/19 - 04/17/20.   PA Reference #: [id number]. - CF

## 2019-12-26 ENCOUNTER — Encounter: Payer: Self-pay | Admitting: Physician Assistant

## 2019-12-26 ENCOUNTER — Other Ambulatory Visit: Payer: Self-pay | Admitting: Physician Assistant

## 2019-12-26 MED ORDER — SAXENDA 18 MG/3ML ~~LOC~~ SOPN: 0.6000 mg | PEN_INJECTOR | Freq: Every day | SUBCUTANEOUS | 1 refills | Status: DC

## 2019-12-26 NOTE — Telephone Encounter (Signed)
Pended RX for 5 pens. Please send if appropriate

## 2019-12-29 MED FILL — SAXENDA 18 MG/3 ML PEN: 18 | 30 days supply | Qty: 15 | Fill #0

## 2020-01-03 ENCOUNTER — Other Ambulatory Visit: Payer: Self-pay

## 2020-01-03 ENCOUNTER — Encounter: Payer: Self-pay | Admitting: Allergy and Immunology

## 2020-01-03 ENCOUNTER — Ambulatory Visit: Payer: No Typology Code available for payment source | Admitting: Allergy and Immunology

## 2020-01-03 VITALS — BP 110/76 | HR 84 | Temp 98.3°F | Resp 16 | Ht 66.0 in | Wt 270.3 lb

## 2020-01-03 DIAGNOSIS — J3089 Other allergic rhinitis: Secondary | ICD-10-CM

## 2020-01-03 DIAGNOSIS — T7800XA Anaphylactic reaction due to unspecified food, initial encounter: Secondary | ICD-10-CM

## 2020-01-03 DIAGNOSIS — H1013 Acute atopic conjunctivitis, bilateral: Secondary | ICD-10-CM

## 2020-01-03 DIAGNOSIS — L858 Other specified epidermal thickening: Secondary | ICD-10-CM

## 2020-01-03 DIAGNOSIS — J4530 Mild persistent asthma, uncomplicated: Secondary | ICD-10-CM | POA: Diagnosis not present

## 2020-01-03 DIAGNOSIS — H1010 Acute atopic conjunctivitis, unspecified eye: Secondary | ICD-10-CM | POA: Insufficient documentation

## 2020-01-03 MED ORDER — LEVOCETIRIZINE DIHYDROCHLORIDE 5 MG PO TABS: 5.0000 mg | ORAL_TABLET | Freq: Every day | ORAL | 5 refills | Status: DC | PRN

## 2020-01-03 MED ORDER — AZELASTINE HCL 0.15 % NA SOLN: 1.0000 | Freq: Two times a day (BID) | NASAL | 5 refills | Status: DC | PRN

## 2020-01-03 MED ORDER — OLOPATADINE HCL 0.2 % OP SOLN: 1.0000 [drp] | Freq: Every day | OPHTHALMIC | 5 refills | Status: DC | PRN

## 2020-01-03 MED ORDER — EPINEPHRINE 0.3 MG/0.3ML IJ SOAJ: 0.3000 mg | INTRAMUSCULAR | 1 refills | Status: DC | PRN

## 2020-01-03 MED FILL — AZELASTINE 0.15% NASAL SPRY: 0.15 | 25 days supply | Qty: 30 | Fill #0

## 2020-01-03 MED FILL — OLOPATADINE HCL 0.2 % SOLN: 0.2 | 25 days supply | Qty: 3 | Fill #0

## 2020-01-03 MED FILL — LEVOCETIRIZINE 5 MG TABLET: 5 | 30 days supply | Qty: 30 | Fill #0

## 2020-01-03 NOTE — Assessment & Plan Note (Signed)
The patient's history and physical exam suggest keratosis pilaris. Reassurance has been provided that keratosis pilaris does not have long-term health implications, occurs in otherwise healthy people, and treatment usually isn't necessary. Keratosis pilaris may become inflamed with exercise, heat, or emotion.   Information regarding keratosis pilaris was discussed, questions were answered and written information was provided.  A prescription has been provided for  ammonium lactate 12% lotion applied to affected areas twice a day as needed.

## 2020-01-03 NOTE — Assessment & Plan Note (Signed)
Possible food allergy.  The patients history suggests food allergy, though todays skin tests were negative despite a positive histamine control.  Food allergen skin testing has excellent negative predictive value however there is still a 5% chance that the allergy exists.  Therefore, we will investigate further with serum specific IgE levels and, if negative, open graded oral challenge.  A laboratory order form has been provided for baseline tryptase level as well as serum specific IgE against being the panel and alpha gal panel.  Until the food allergy has been definitively ruled out, the patient is to continue meticulous avoidance and have access to epinephrine autoinjector 2 pack.  A prescription has been provided for epinephrine 0.3 mg autoinjector (AuviQ) 2 pack along with instructions for its proper administration.

## 2020-01-03 NOTE — Addendum Note (Signed)
Addended by: Valere Dross on: 01/03/2020 02:29 PM   Modules accepted: Orders

## 2020-01-03 NOTE — Assessment & Plan Note (Signed)
   Aeroallergen avoidance measures have been discussed and provided in written form.  A prescription has been provided for levocetirizine(Xyzal), 5 mg daily as needed.  To avoid diminishing benefit with daily use (tachyphylaxis) of second generation antihistamine, consider alternating every few months between fexofenadine (Allegra) and levocetirizine (Xyzal).  A prescription has been provided for azelastine nasal spray, 1-2 sprays per nostril 2 times daily as needed. Proper nasal spray technique has been discussed and demonstrated.   Nasal saline spray (i.e., Simply Saline) or nasal saline lavage (i.e., NeilMed) is recommended as needed and prior to medicated nasal sprays.  The risks and benefits of aeroallergen immunotherapy have been discussed. The patient is interested in the possibility of initiating immunotherapy if insurance coverage is favorable. She will let us know how she would like to proceed.

## 2020-01-03 NOTE — Progress Notes (Signed)
New Patient Note  RE: Kayla Campos MRN: [id number] DOB: 1986-05-31 Date of Office Visit: 01/03/2020  Referring provider: Lavada Mesi Primary care provider: Donella Stade, PA-C  Chief Complaint: Allergic Rhinitis , Asthma, and Food Intolerance  History of present illness: Kayla Campos is a 34 y.o. female seen today in consultation requested by Iran Planas, PA-C.  She reports that for many years she has experienced nasal congestion, rhinorrhea, sneezing, nasal pruritus, and ocular pruritus.  The symptoms have progressed over the past year.  These symptoms occur year-round but seem to be more frequent and severe with exposure to pollen and dogs.  Her dog sleeps in the bedroom and she states that regarding the allergy she has "little attacks in the bedroom."  In addition, over the past 3 days while off of antihistamines in anticipation of today's testing, she was licked by the dog on the forearm and developed localized urticaria. Kayla Campos was diagnosed with asthma as a child at which time she "was on nebulizers all the time."  The asthma gradually improved, however she still experienced asthma symptoms with exercise particularly during cold weather.  However, over this past year she has been experiencing asthma symptoms, including coughing, wheezing, dyspnea, and chest tightness, more frequently.  She uses albuterol rescue a few times a month on average, however notes that she does experience lower respiratory symptoms more frequently than that.  She complains that her asthma symptoms are triggered by normal daily activities, such as prolonged talking at work, or walking up a flight of stairs.  She does not experience nocturnal awakenings due to lower respiratory symptoms.  She was recently prescribed montelukast, however did not start this prescription yet. Kayla Campos reports that approximately 10 years ago she consumed a meal with white beans and experienced pruritus  of the lips.  More recently, she consumed baked beans and experienced sneezing, ocular pruritus, lip swelling, and eyelid swelling.  Most recently, she consumed a Chipotle burrito wrap containing black beans and experienced the sensation of throat constriction making it "hard to swallow spit."  She does note that there have been times when she has consumed beings without symptoms.  She is uncertain if the beings are causing the symptoms or if that is merely coincidental.  She is interested in determining her environmental and food allergy status today. She complains of keratosis pilaris on the upper and lower extremities.  Assessment and plan: Perennial and seasonal allergic rhinitis  Aeroallergen avoidance measures have been discussed and provided in written form.  A prescription has been provided for levocetirizine(Xyzal), 5 mg daily as needed.  To avoid diminishing benefit with daily use (tachyphylaxis) of second generation antihistamine, consider alternating every few months between fexofenadine (Allegra) and levocetirizine (Xyzal).  A prescription has been provided for azelastine nasal spray, 1-2 sprays per nostril 2 times daily as needed. Proper nasal spray technique has been discussed and demonstrated.   Nasal saline spray (i.e., Simply Saline) or nasal saline lavage (i.e., NeilMed) is recommended as needed and prior to medicated nasal sprays.  The risks and benefits of aeroallergen immunotherapy have been discussed. The patient is interested in the possibility of initiating immunotherapy if insurance coverage is favorable. She will let us know how she would like to proceed.  Allergic conjunctivitis  Treatment plan as outlined above for allergic rhinitis.  A prescription has been provided for Pataday, one drop per eye daily as needed.  I have also recommended eye lubricant drops (i.e., Natural Tears)  as needed.  Mild persistent asthma  Start montelukast 10 mg daily at bedtime.   Potential side effects have been discussed.  Continue albuterol HFA, 1 to 2 inhalations every 4-6 hours if needed.  I have also recommended using albuterol 15 minutes prior to exercise.  Subjective and objective measures of pulmonary function will be followed and the treatment plan will be adjusted accordingly.  Food allergy Possible food allergy.  The patients history suggests food allergy, though todays skin tests were negative despite a positive histamine control.  Food allergen skin testing has excellent negative predictive value however there is still a 5% chance that the allergy exists.  Therefore, we will investigate further with serum specific IgE levels and, if negative, open graded oral challenge.  A laboratory order form has been provided for baseline tryptase level as well as serum specific IgE against being the panel and alpha gal panel.  Until the food allergy has been definitively ruled out, the patient is to continue meticulous avoidance and have access to epinephrine autoinjector 2 pack.  A prescription has been provided for epinephrine 0.3 mg autoinjector (AuviQ) 2 pack along with instructions for its proper administration.  Keratosis pilaris The patient's history and physical exam suggest keratosis pilaris. Reassurance has been provided that keratosis pilaris does not have long-term health implications, occurs in otherwise healthy people, and treatment usually isn't necessary. Keratosis pilaris may become inflamed with exercise, heat, or emotion.   Information regarding keratosis pilaris was discussed, questions were answered and written information was provided.  A prescription has been provided for  ammonium lactate 12% lotion applied to affected areas twice a day as needed.   Meds ordered this encounter  Medications  . levocetirizine (XYZAL) 5 MG tablet    Sig: Take 1 tablet (5 mg total) by mouth daily as needed for allergies.    Dispense:  30 tablet    Refill:  5    . Azelastine HCl 0.15 % SOLN    Sig: Place 1-2 sprays into both nostrils 2 (two) times daily as needed.    Dispense:  30 mL    Refill:  5  . Olopatadine HCl (PATADAY) 0.2 % SOLN    Sig: Place 1 drop into both eyes daily as needed.    Dispense:  2.5 mL    Refill:  5  . EPINEPHrine (AUVI-Q) 0.3 mg/0.3 mL IJ SOAJ injection    Sig: Inject 0.3 mLs (0.3 mg total) into the muscle as needed for anaphylaxis.    Dispense:  2 each    Refill:  1    Diagnostics: Spirometry: Spirometry reveals an FVC of 3.60 L and an FEV1 of 3.20 L (96% predicted) without postbronchodilator improvement.  This study was performed while the patient was asymptomatic.  Please see scanned spirometry results for details.  Environmental skin testing: Positive to grass pollen, weed pollen, ragweed pollen, tree pollen, cat hair, dog epithelia, and dust mite antigen.  Borderline positive/equivocal to mold and cockroach antigen. Food allergen skin testing: Negative despite a positive histamine control.    Physical examination: Blood pressure 110/76, pulse 84, temperature 98.3 F (36.8 C), temperature source Oral, resp. rate 16, height 5\' 6"  (1.676 m), weight 270 lb 4.5 oz (122.6 kg), SpO2 97 %.  General: Alert, interactive, in no acute distress. HEENT: TMs pearly gray, turbinates moderately edematous with clear discharge, post-pharynx moderately erythematous. Neck: Supple without lymphadenopathy. Lungs: Clear to auscultation without wheezing, rhonchi or rales. CV: Normal S1, S2 without murmurs. Abdomen: Nondistended, nontender.  Skin: [id number] rough follicular mildly-erythematous papules upper extremities bilaterally. Extremities:  No clubbing, cyanosis or edema. Neuro:   Grossly intact.  Review of systems:  Review of systems negative except as noted in HPI / PMHx or noted below: Review of Systems  Constitutional: Negative.   HENT: Negative.   Eyes: Negative.   Respiratory: Negative.   Cardiovascular: Negative.    Gastrointestinal: Negative.   Genitourinary: Negative.   Musculoskeletal: Negative.   Skin: Negative.   Neurological: Negative.   Endo/Heme/Allergies: Negative.   Psychiatric/Behavioral: Negative.     Past medical history:  Past Medical History:  Diagnosis Date  . Asthma   . Eczema   . Hypertension   . Pleurisy   . PNA (pneumonia)   . Urticaria     Past surgical history:  Past Surgical History:  Procedure Laterality Date  . WISDOM TOOTH EXTRACTION  2008    Family history: Family History  Problem Relation Age of Onset  . Hypertension Mother   . Diabetes Father   . Hypertension Father   . Coronary artery disease Father   . Hyperlipidemia Father   . Hypertension Sister   . Allergic rhinitis Sister   . Eczema Sister   . Urticaria Sister   . Hyperlipidemia Maternal Aunt   . Heart attack Maternal Grandmother   . Heart attack Maternal Grandfather   . Diabetes Maternal Grandfather   . Stroke Maternal Grandfather   . Cancer Paternal Grandmother   . Heart attack Paternal Grandmother   . Diabetes Paternal Grandmother   . Asthma Neg Hx   . Angioedema Neg Hx     Social history: Social History   Socioeconomic History  . Marital status: Married    Spouse name: Not on file  . Number of children: Not on file  . Years of education: Not on file  . Highest education level: Not on file  Occupational History  . Not on file  Tobacco Use  . Smoking status: Never Smoker  . Smokeless tobacco: Never Used  Substance and Sexual Activity  . Alcohol use: No  . Drug use: No  . Sexual activity: Yes  Other Topics Concern  . Not on file  Social History Narrative  . Not on file   Social Determinants of Health   Financial Resource Strain:   . Difficulty of Paying Living Expenses: Not on file  Food Insecurity:   . Worried About Charity fundraiser in the Last Year: Not on file  . Ran Out of Food in the Last Year: Not on file  Transportation Needs:   . Lack of  Transportation (Medical): Not on file  . Lack of Transportation (Non-Medical): Not on file  Physical Activity:   . Days of Exercise per Week: Not on file  . Minutes of Exercise per Session: Not on file  Stress:   . Feeling of Stress : Not on file  Social Connections:   . Frequency of Communication with Friends and Family: Not on file  . Frequency of Social Gatherings with Friends and Family: Not on file  . Attends Religious Services: Not on file  . Active Member of Clubs or Organizations: Not on file  . Attends Archivist Meetings: Not on file  . Marital Status: Not on file  Intimate Partner Violence:   . Fear of Current or Ex-Partner: Not on file  . Emotionally Abused: Not on file  . Physically Abused: Not on file  . Sexually Abused: Not on file  Environmental History: The patient lives in a 35 year old house with carpeting the bedroom, gas heat, and central air.  There is a dog in the home which has access to her bedroom.  She is a non-smoker.  Current Outpatient Medications  Medication Sig Dispense Refill  . Biotin 10000 MCG TABS Take by mouth.    . cetirizine (ZYRTEC) 10 MG tablet Take 10 mg by mouth daily.    Marland Kitchen lisdexamfetamine (VYVANSE) 10 MG capsule Take 1 capsule (10 mg total) by mouth daily. 30 capsule 0  . lisinopril-hydrochlorothiazide (ZESTORETIC) 10-12.5 MG tablet Take 1 tablet by mouth daily. 90 tablet 3  . Norgestimate-Ethinyl Estradiol Triphasic (ORTHO TRI-CYCLEN LO) 0.18/0.215/0.25 MG-25 MCG tablet Take 1 tablet by mouth daily.    . TECHLITE PEN NEEDLES 32G X 6 MM MISC Patient hasn't starting using these needles for weight loss medication    . albuterol (VENTOLIN HFA) 108 (90 Base) MCG/ACT inhaler Inhale 2 puffs into the lungs every 6 (six) hours as needed for up to 10 days for wheezing or shortness of breath. 18 g 1  . Azelastine HCl 0.15 % SOLN Place 1-2 sprays into both nostrils 2 (two) times daily as needed. 30 mL 5  . cetirizine (ZYRTEC) 10 MG  chewable tablet Chew 1 tablet (10 mg total) by mouth daily. (Patient not taking: Reported on 01/03/2020) 30 tablet 5  . EPINEPHrine (AUVI-Q) 0.3 mg/0.3 mL IJ SOAJ injection Inject 0.3 mLs (0.3 mg total) into the muscle as needed for anaphylaxis. 2 each 1  . levocetirizine (XYZAL) 5 MG tablet Take 1 tablet (5 mg total) by mouth daily as needed for allergies. 30 tablet 5  . Liraglutide -Weight Management (SAXENDA) 18 MG/3ML SOPN Inject 0.6 mg into the skin daily. For one week then increase by .6mg  weekly until reaches 3mg  daily.  Please include ultra fine needles 54mm (Patient not taking: Reported on 01/03/2020) 5 pen 1  . montelukast (SINGULAIR) 10 MG tablet Take 1 tablet (10 mg total) by mouth at bedtime. (Patient not taking: Reported on 01/03/2020) 90 tablet 3  . Olopatadine HCl (PATADAY) 0.2 % SOLN Place 1 drop into both eyes daily as needed. 2.5 mL 5   No current facility-administered medications for this visit.    Known medication allergies: Allergies  Allergen Reactions  . Keflex [Cephalexin] Hives    I appreciate the opportunity to take part in [place] care. Please do not hesitate to contact me with questions.  Sincerely,   R. Edgar Frisk, MD

## 2020-01-03 NOTE — Patient Instructions (Addendum)
Perennial and seasonal allergic rhinitis  Aeroallergen avoidance measures have been discussed and provided in written form.  A prescription has been provided for levocetirizine(Xyzal), 5 mg daily as needed.  To avoid diminishing benefit with daily use (tachyphylaxis) of second generation antihistamine, consider alternating every few months between fexofenadine (Allegra) and levocetirizine (Xyzal).  A prescription has been provided for azelastine nasal spray, 1-2 sprays per nostril 2 times daily as needed. Proper nasal spray technique has been discussed and demonstrated.   Nasal saline spray (i.e., Simply Saline) or nasal saline lavage (i.e., NeilMed) is recommended as needed and prior to medicated nasal sprays.  The risks and benefits of aeroallergen immunotherapy have been discussed. The patient is interested in the possibility of initiating immunotherapy if insurance coverage is favorable. She will let us know how she would like to proceed.  Allergic conjunctivitis  Treatment plan as outlined above for allergic rhinitis.  A prescription has been provided for Pataday, one drop per eye daily as needed.  I have also recommended eye lubricant drops (i.e., Natural Tears) as needed.  Mild persistent asthma  Start montelukast 10 mg daily at bedtime.  Potential side effects have been discussed.  Continue albuterol HFA, 1 to 2 inhalations every 4-6 hours if needed.  I have also recommended using albuterol 15 minutes prior to exercise.  Subjective and objective measures of pulmonary function will be followed and the treatment plan will be adjusted accordingly.  Food allergy Possible food allergy.  The patients history suggests food allergy, though todays skin tests were negative despite a positive histamine control.  Food allergen skin testing has excellent negative predictive value however there is still a 5% chance that the allergy exists.  Therefore, we will investigate further with serum  specific IgE levels and, if negative, open graded oral challenge.  A laboratory order form has been provided for baseline tryptase level as well as serum specific IgE against being the panel and alpha gal panel.  Until the food allergy has been definitively ruled out, the patient is to continue meticulous avoidance and have access to epinephrine autoinjector 2 pack.  A prescription has been provided for epinephrine 0.3 mg autoinjector (AuviQ) 2 pack along with instructions for its proper administration.  Keratosis pilaris The patient's history and physical exam suggest keratosis pilaris. Reassurance has been provided that keratosis pilaris does not have long-term health implications, occurs in otherwise healthy people, and treatment usually isn't necessary. Keratosis pilaris may become inflamed with exercise, heat, or emotion.   Information regarding keratosis pilaris was discussed, questions were answered and written information was provided.  A prescription has been provided for  ammonium lactate 12% lotion applied to affected areas twice a day as needed.   Return in about 3 months (around 04/01/2020), or if symptoms worsen or fail to improve.  Control of Dust Mite Allergen  House dust mites play a major role in allergic asthma and rhinitis.  They occur in environments with high humidity wherever human skin, the food for dust mites is found. High levels have been detected in dust obtained from mattresses, pillows, carpets, upholstered furniture, bed covers, clothes and soft toys.  The principal allergen of the house dust mite is found in its feces.  A gram of dust may contain 1,000 mites and 250,000 fecal particles.  Mite antigen is easily measured in the air during house cleaning activities.    1. Encase mattresses, including the box spring, and pillow, in an air tight cover.  Seal the zipper end  of the encased mattresses with wide adhesive tape. 2. Wash the bedding in water of 130 degrees  Farenheit weekly.  Avoid cotton comforters/quilts and flannel bedding: the most ideal bed covering is the dacron comforter. 3. Remove all upholstered furniture from the bedroom. 4. Remove carpets, carpet padding, rugs, and non-washable window drapes from the bedroom.  Wash drapes weekly or use plastic window coverings. 5. Remove all non-washable stuffed toys from the bedroom.  Wash stuffed toys weekly. 6. Have the room cleaned frequently with a vacuum cleaner and a damp dust-mop.  The patient should not be in a room which is being cleaned and should wait 1 hour after cleaning before going into the room. 7. Close and seal all heating outlets in the bedroom.  Otherwise, the room will become filled with dust-laden air.  An electric heater can be used to heat the room. Reduce indoor humidity to less than 50%.  Do not use a humidifier.   Reducing Pollen Exposure  The American Academy of Allergy, Asthma and Immunology suggests the following steps to reduce your exposure to pollen during allergy seasons.    1. Do not hang sheets or clothing out to dry; pollen may collect on these items. 2. Do not mow lawns or spend time around freshly cut grass; mowing stirs up pollen. 3. Keep windows closed at night.  Keep car windows closed while driving. 4. Minimize morning activities outdoors, a time when pollen counts are usually at their highest. 5. Stay indoors as much as possible when pollen counts or humidity is high and on windy days when pollen tends to remain in the air longer. 6. Use air conditioning when possible.  Many air conditioners have filters that trap the pollen spores. 7. Use a HEPA room air filter to remove pollen form the indoor air you breathe.   Control of Dog or Cat Allergen  Avoidance is the best way to manage a dog or cat allergy. If you have a dog or cat and are allergic to dog or cats, consider removing the dog or cat from the home. If you have a dog or cat but don't want to find it  a new home, or if your family wants a pet even though someone in the household is allergic, here are some strategies that may help keep symptoms at bay:  1. Keep the pet out of your bedroom and restrict it to only a few rooms. Be advised that keeping the dog or cat in only one room will not limit the allergens to that room. 2. Don't pet, hug or kiss the dog or cat; if you do, wash your hands with soap and water. 3. High-efficiency particulate air (HEPA) cleaners run continuously in a bedroom or living room can reduce allergen levels over time. 4. Place electrostatic material sheet in the air inlet vent in the bedroom. 5. Regular use of a high-efficiency vacuum cleaner or a central vacuum can reduce allergen levels. 6. Giving your dog or cat a bath at least once a week can reduce airborne allergen.   Control of Mold Allergen  Mold and fungi can grow on a variety of surfaces provided certain temperature and moisture conditions exist.  Outdoor molds grow on plants, decaying vegetation and soil.  The major outdoor mold, Alternaria and Cladosporium, are found in very high numbers during hot and dry conditions.  Generally, a late Summer - Fall peak is seen for common outdoor fungal spores.  Rain will temporarily lower outdoor mold spore count,  but counts rise rapidly when the rainy period ends.  The most important indoor molds are Aspergillus and Penicillium.  Dark, humid and poorly ventilated basements are ideal sites for mold growth.  The next most common sites of mold growth are the bathroom and the kitchen.  Outdoor [employer] 1. Use air conditioning and keep windows closed 2. Avoid exposure to decaying vegetation. 3. Avoid leaf raking. 4. Avoid grain handling. 5. Consider wearing a face mask if working in moldy areas.  Indoor Mold Control 1. Maintain humidity below 50%. 2. Clean washable surfaces with 5% bleach solution. 3. Remove sources e.g. Contaminated carpets.   Control of Cockroach  Allergen  Cockroach allergen has been identified as an important cause of acute attacks of asthma, especially in urban settings.  There are fifty-five species of cockroach that exist in the [place], however only three, the [place], [place] species produce allergen that can affect patients with Asthma.  Allergens can be obtained from fecal particles, egg casings and secretions from cockroaches.    1. Remove food sources. 2. Reduce access to water. 3. Seal access and entry points. 4. Spray runways with 0.5-1% Diazinon or Chlorpyrifos 5. Blow boric acid power under stoves and refrigerator. 6. Place bait stations (hydramethylnon) at feeding sites.  Keratosis pilaris  Signs and symptoms Keratosis pilaris is a harmless skin disorder that causes small, acne-like bumps. Although it isn't serious, keratosis pilaris can be frustrating because it's difficult to treat.  Keratosis pilaris results from a buildup of protein called keratin in the openings of hair follicles in the skin. This produces small, rough patches, usually on the arms and thighs, and can give skin a goose flesh or sandpaper appearance.   They usually don't hurt or itch. Typically, patches are skin colored, but they can, at times, be red and inflamed. Keratosis pilaris can also appear on the face, where it closely resembles acne. The small size of the bumps and its association with dry, chapped skin distinguish keratosis pilaris from pustular acne. Unlike elsewhere on the body, keratosis pilaris on the face may leave small scars. Though quite common with young children, keratosis pilaris can occur at any age.  It may improve, especially during the summer months, only to later worsen. Dry skin tends to worsen the condition.  Gradually, keratosis pilaris resolves on its own.  Many people are bothered by the goose flesh appearance of keratosis pilaris, but it doesn't have long-term health implications and occurs in otherwise  healthy people.  Keratosis pilaris isn't a serious medical condition, and treatment usually isn't necessary.  Treatment No single treatment universally improves keratosis pilaris. But most options, including self-care measures and medicated creams, focus on softening the keratin deposits in the skin.  Self-care Although self-help measures won't cure keratosis pilaris, they may help improve the appearance of your skin. You may find these measures beneficial: . Be gentle when washing your skin. Vigorous scrubbing or removal of the plugs may only irritate your skin and aggravate the condition.  . After washing or bathing, gently pat or blot your skin dry with a towel so that some moisture remains on the skin.  Marland Kitchen Apply the moisturizing lotion or lubricating cream while your skin is still moist from bathing. Choose a moisturizer that contains urea or propylene glycol, chemicals that soften dry, rough skin.  Marland Kitchen Apply an over-the-counter product that contains lactic acid twice daily (ie, Lac-Hydrin 12% lotion). Lactic acid helps remove extra keratin from the surface of the  skin.  . Use a humidifier to add moisture to the air inside your home. Low humidity dries out your skin.

## 2020-01-03 NOTE — Assessment & Plan Note (Signed)
   Treatment plan as outlined above for allergic rhinitis.  A prescription has been provided for Pataday, one drop per eye daily as needed.  I have also recommended eye lubricant drops (i.e., Natural Tears) as needed. 

## 2020-01-03 NOTE — Assessment & Plan Note (Signed)
   Start montelukast 10 mg daily at bedtime.  Potential side effects have been discussed.  Continue albuterol HFA, 1 to 2 inhalations every 4-6 hours if needed.  I have also recommended using albuterol 15 minutes prior to exercise.  Subjective and objective measures of pulmonary function will be followed and the treatment plan will be adjusted accordingly.

## 2020-01-08 LAB — ALLERGEN, WHITE BEAN, F15: White Bean IgE: <0.1 kU/L

## 2020-01-08 LAB — F296-IGE CAROB BEAN: Carob Bean IgE: <0.1 kU/L

## 2020-01-08 LAB — ALLERGEN, GREEN BEAN, RF315: Allergen Green Bean IgE: <0.1 kU/L

## 2020-01-08 LAB — ALLERGEN, BEAN BLACK
Black Bean*, IgE: <0.35 kU/L (ref <0.35)
Class Interpretation: 0

## 2020-01-08 LAB — F182-IGE LIMA BEAN: Lima Bean IgE: <0.1 kU/L

## 2020-01-08 LAB — ALLERGEN, KIDNEY BEAN, RF287: Kidney Bean IgE: <0.1 kU/L

## 2020-01-09 ENCOUNTER — Ambulatory Visit: Payer: No Typology Code available for payment source | Admitting: Physician Assistant

## 2020-01-17 ENCOUNTER — Other Ambulatory Visit: Payer: Self-pay

## 2020-01-17 ENCOUNTER — Encounter (HOSPITAL_BASED_OUTPATIENT_CLINIC_OR_DEPARTMENT_OTHER): Payer: Self-pay | Admitting: Oncology

## 2020-01-17 ENCOUNTER — Emergency Department (HOSPITAL_BASED_OUTPATIENT_CLINIC_OR_DEPARTMENT_OTHER)
Admission: EM | Admit: 2020-01-17 | Discharge: 2020-01-17 | Disposition: A | Discharge status: Home / Self Care | Payer: No Typology Code available for payment source | Attending: Emergency Medicine | Admitting: Emergency Medicine

## 2020-01-17 ENCOUNTER — Emergency Department (HOSPITAL_BASED_OUTPATIENT_CLINIC_OR_DEPARTMENT_OTHER): Payer: No Typology Code available for payment source

## 2020-01-17 DIAGNOSIS — Z79899 Other long term (current) drug therapy: Secondary | ICD-10-CM | POA: Diagnosis not present

## 2020-01-17 DIAGNOSIS — R1013 Epigastric pain: Secondary | ICD-10-CM | POA: Insufficient documentation

## 2020-01-17 DIAGNOSIS — I10 Essential (primary) hypertension: Secondary | ICD-10-CM | POA: Diagnosis not present

## 2020-01-17 DIAGNOSIS — J45909 Unspecified asthma, uncomplicated: Secondary | ICD-10-CM | POA: Insufficient documentation

## 2020-01-17 DIAGNOSIS — R079 Chest pain, unspecified: Secondary | ICD-10-CM | POA: Insufficient documentation

## 2020-01-17 LAB — COMPREHENSIVE METABOLIC PANEL
ALT: 20 U/L (ref 0–44)
AST: 22 U/L (ref 15–41)
Albumin: 3.3 g/dL — ABNORMAL LOW (ref 3.5–5.0)
Alkaline Phosphatase: 46 U/L (ref 38–126)
Anion gap: 12 (ref 5–15)
BUN: 14 mg/dL (ref 6–20)
CO2: 25 mmol/L (ref 22–32)
Calcium: 9.5 mg/dL (ref 8.9–10.3)
Chloride: 102 mmol/L (ref 98–111)
Creatinine, Ser: 0.7 mg/dL (ref 0.44–1.00)
GFR calc Af Amer: >60 mL/min (ref >60)
GFR calc non Af Amer: >60 mL/min (ref >60)
Glucose, Bld: 124 mg/dL — ABNORMAL HIGH (ref 70–99)
Potassium: 3.8 mmol/L (ref 3.5–5.1)
Sodium: 139 mmol/L (ref 135–145)
Total Bilirubin: 0.4 mg/dL (ref 0.3–1.2)
Total Protein: 6.8 g/dL (ref 6.5–8.1)

## 2020-01-17 LAB — CBC
HCT: 38.2 % (ref 36.0–46.0)
Hemoglobin: 12.6 g/dL (ref 12.0–15.0)
MCH: 30 pg (ref 26.0–34.0)
MCHC: 33 g/dL (ref 30.0–36.0)
MCV: 91 fL (ref 80.0–100.0)
Platelets: 270 10*3/uL (ref 150–400)
RBC: 4.2 MIL/uL (ref 3.87–5.11)
RDW: 12.4 % (ref 11.5–15.5)
WBC: 10.2 10*3/uL (ref 4.0–10.5)
nRBC: 0 % (ref 0.0–0.2)

## 2020-01-17 LAB — TROPONIN I (HIGH SENSITIVITY): Troponin I (High Sensitivity): 3 ng/L (ref <18)

## 2020-01-17 LAB — PREGNANCY, URINE: Preg Test, Ur: NEGATIVE

## 2020-01-17 LAB — LIPASE, BLOOD: Lipase: 23 U/L (ref 11–51)

## 2020-01-17 MED ORDER — SODIUM CHLORIDE 0.9 % IV BOLUS (SEPSIS)
1000.0000 mL | Freq: Once | INTRAVENOUS | Status: AC
  Administered 2020-01-17: 1000 mL via INTRAVENOUS

## 2020-01-17 MED ORDER — HYOSCYAMINE SULFATE 0.125 MG SL SUBL
0.2500 mg | SUBLINGUAL_TABLET | Freq: Once | SUBLINGUAL | Status: AC
  Administered 2020-01-17: 0.25 mg via SUBLINGUAL
  Filled 2020-01-17: qty 2

## 2020-01-17 MED ORDER — METOCLOPRAMIDE HCL 5 MG/ML IJ SOLN
10.0000 mg | Freq: Once | INTRAMUSCULAR | Status: AC
  Administered 2020-01-17: 10 mg via INTRAVENOUS
  Filled 2020-01-17: qty 2

## 2020-01-17 MED ORDER — ONDANSETRON 4 MG PO TBDP: 4.0000 mg | ORAL_TABLET | Freq: Three times a day (TID) | ORAL | 0 refills | Status: AC | PRN

## 2020-01-17 MED ORDER — SODIUM CHLORIDE 0.9 % IV SOLN: 1000.0000 mL | INTRAVENOUS | Status: DC

## 2020-01-17 MED ORDER — MORPHINE SULFATE (PF) 4 MG/ML IV SOLN
4.0000 mg | Freq: Once | INTRAVENOUS | Status: AC
  Administered 2020-01-17: 4 mg via INTRAVENOUS
  Filled 2020-01-17: qty 1

## 2020-01-17 MED ORDER — LIDOCAINE VISCOUS HCL 2 % MT SOLN
15.0000 mL | Freq: Once | OROMUCOSAL | Status: AC
  Administered 2020-01-17: 15 mL via ORAL
  Filled 2020-01-17: qty 15

## 2020-01-17 MED ORDER — ALUM & MAG HYDROXIDE-SIMETH 200-200-20 MG/5ML PO SUSP
30.0000 mL | Freq: Once | ORAL | Status: AC
  Administered 2020-01-17: 30 mL via ORAL
  Filled 2020-01-17: qty 30

## 2020-01-17 MED ORDER — HYOSCYAMINE SULFATE SL 0.125 MG SL SUBL: 1.0000 | SUBLINGUAL_TABLET | Freq: Four times a day (QID) | SUBLINGUAL | 0 refills | Status: DC | PRN

## 2020-01-17 MED FILL — ONDANSETRON ODT 4 MG TABLET: 4 | 5 days supply | Qty: 15 | Fill #0

## 2020-01-17 MED FILL — OSCIMIN SL 0.125 MG TABLET: 0.125 | 7 days supply | Qty: 30 | Fill #0

## 2020-01-17 NOTE — ED Triage Notes (Signed)
Pt c/o central CP w/ radiation to her back.  Has had both COVID vaccinations. States sx started Sunday. Denies shob, cough, fever, chills or any other sx.  States she is unclear if it is true CP or more GI related.

## 2020-01-17 NOTE — ED Provider Notes (Signed)
[facility] EMERGENCY DEPARTMENT Provider Note  CSN: [id number] Arrival date & time: 01/17/20 [id number]  Chief Complaint(s) Chest Pain  HPI Kayla Campos is a 34 y.o. female   The history is provided by the patient.  Chest Pain Pain location:  Substernal area and epigastric Pain quality: aching   Radiates to: at times to upper back. Pain severity:  Severe Onset quality:  Gradual Duration:  3 hours Timing:  Constant Progression:  Waxing and waning Chronicity: had 1 similar episode 3 days ago that resolved after emesis and OTC meds. Context comment:  She tested COVID+ 2 days ago, after being 24 days out since 2nd COVID vax Relieved by: being upright. Exacerbated by: lying down. Ineffective treatments:  Antacids Associated symptoms: nausea   Associated symptoms: no cough, no fatigue, no fever, no shortness of breath and no vomiting (this episode)     Past Medical History Past Medical History:  Diagnosis Date  . Asthma   . Eczema   . Hypertension   . Pleurisy   . PNA (pneumonia)   . Urticaria    Patient Active Problem List   Diagnosis Date Noted  . Allergic conjunctivitis 01/03/2020  . Mild persistent asthma 01/03/2020  . Food allergy 01/03/2020  . Binge-eating disorder, mild 12/12/2019  . Perennial and seasonal allergic rhinitis 12/12/2019  . Wheezing 12/12/2019  . Hematuria 02/26/2016  . Stress 10/29/2015  . Abnormal weight gain 10/29/2015  . Hair loss 10/29/2015  . Morbid obesity ([place]) 10/29/2015  . BMI 36.0-36.9,adult 12/11/2014  . Hyperpigmentation of skin of cheek 12/11/2014  . Fatty liver disease, nonalcoholic [id number]  . Bloating 05/25/2014  . Gastroesophageal reflux disease without esophagitis 05/25/2014  . Nausea alone 05/25/2014  . Essential hypertension, benign 05/25/2014  . Keratosis pilaris 10/11/2013   Home Medication(s) Prior to Admission medications   Medication Sig Start Date End Date Taking? Authorizing Provider   albuterol (VENTOLIN HFA) 108 (90 Base) MCG/ACT inhaler Inhale 2 puffs into the lungs every 6 (six) hours as needed for up to 10 days for wheezing or shortness of breath. 12/12/19 12/22/19  Donella Stade, PA-C  Azelastine HCl 0.15 % SOLN Place 1-2 sprays into both nostrils 2 (two) times daily as needed. 01/03/20   Bobbitt, Sedalia Muta, MD  Biotin 10000 MCG TABS Take by mouth.    [provider]  cetirizine (ZYRTEC) 10 MG chewable tablet Chew 1 tablet (10 mg total) by mouth daily. Patient not taking: Reported on 01/03/2020 08/02/17   Donella Stade, PA-C  cetirizine (ZYRTEC) 10 MG tablet Take 10 mg by mouth daily.    [provider]  EPINEPHrine (AUVI-Q) 0.3 mg/0.3 mL IJ SOAJ injection Inject 0.3 mLs (0.3 mg total) into the muscle as needed for anaphylaxis. 01/03/20   Bobbitt, Sedalia Muta, MD  Hyoscyamine Sulfate SL (LEVSIN/SL) 0.125 MG SUBL Place 1 each under the tongue 4 (four) times daily as needed for up to 5 days. 01/17/20 01/22/20  Fatima Blank, MD  levocetirizine (XYZAL) 5 MG tablet Take 1 tablet (5 mg total) by mouth daily as needed for allergies. 01/03/20   Bobbitt, Sedalia Muta, MD  Liraglutide -Weight Management (SAXENDA) 18 MG/3ML SOPN Inject 0.6 mg into the skin daily. For one week then increase by .6mg  weekly until reaches 3mg  daily.  Please include ultra fine needles 62mm Patient not taking: Reported on 01/03/2020 12/26/19   Donella Stade, PA-C  lisdexamfetamine (VYVANSE) 10 MG capsule Take 1 capsule (10 mg total) by mouth daily. 12/12/19  Breeback, Jade L, PA-C  lisinopril-hydrochlorothiazide (ZESTORETIC) 10-12.5 MG tablet Take 1 tablet by mouth daily. 12/12/19   Breeback, Jade L, PA-C  montelukast (SINGULAIR) 10 MG tablet Take 1 tablet (10 mg total) by mouth at bedtime. Patient not taking: Reported on 01/03/2020 12/12/19   Donella Stade, PA-C  Norgestimate-Ethinyl Estradiol Triphasic (ORTHO TRI-CYCLEN LO) 0.18/0.215/0.25 MG-25 MCG tablet Take 1 tablet by mouth  daily.    [provider]  Olopatadine HCl (PATADAY) 0.2 % SOLN Place 1 drop into both eyes daily as needed. 01/03/20   Bobbitt, Sedalia Muta, MD  ondansetron (ZOFRAN ODT) 4 MG disintegrating tablet Take 1 tablet (4 mg total) by mouth every 8 (eight) hours as needed for up to 3 days for nausea or vomiting. 01/17/20 01/20/20  Corean Yoshimura, Grayce Sessions, MD  TECHLITE PEN NEEDLES 32G X 6 MM MISC Patient hasn't starting using these needles for weight loss medication 12/20/19   [provider]                                                                                                                                    Past Surgical History Past Surgical History:  Procedure Laterality Date  . WISDOM TOOTH EXTRACTION  2008   Family History Family History  Problem Relation Age of Onset  . Hypertension Mother   . Diabetes Father   . Hypertension Father   . Coronary artery disease Father   . Hyperlipidemia Father   . Hypertension Sister   . Allergic rhinitis Sister   . Eczema Sister   . Urticaria Sister   . Hyperlipidemia Maternal Aunt   . Heart attack Maternal Grandmother   . Heart attack Maternal Grandfather   . Diabetes Maternal Grandfather   . Stroke Maternal Grandfather   . Cancer Paternal Grandmother   . Heart attack Paternal Grandmother   . Diabetes Paternal Grandmother   . Asthma Neg Hx   . Angioedema Neg Hx     Social History Social History   Tobacco Use  . Smoking status: Never Smoker  . Smokeless tobacco: Never Used  Substance Use Topics  . Alcohol use: No  . Drug use: No   Allergies Keflex [cephalexin]  Review of Systems Review of Systems  Constitutional: Negative for fatigue and fever.  Respiratory: Negative for cough and shortness of breath.   Cardiovascular: Positive for chest pain.  Gastrointestinal: Positive for nausea. Negative for vomiting (this episode).   All other systems are reviewed and are negative for acute change except as noted in  the HPI  Physical Exam Vital Signs  I have reviewed the triage vital signs BP 133/89 (BP Location: Left Arm)   Pulse 69   Temp 98.2 F (36.8 C) (Oral)   Resp (!) 21   Ht 5\' 6"  (1.676 m)   Wt 122.5 kg   LMP 01/08/2020 (Approximate)   SpO2 100%   BMI 43.58 kg/m   Physical  Exam Vitals reviewed.  Constitutional:      General: She is not in acute distress.    Appearance: She is well-developed. She is obese. She is not diaphoretic.     Comments: Pacing in the room  HENT:     Head: Normocephalic and atraumatic.     Nose: Nose normal.  Eyes:     General: No scleral icterus.       Right eye: No discharge.        Left eye: No discharge.     Conjunctiva/sclera: Conjunctivae normal.     Pupils: Pupils are equal, round, and reactive to light.  Cardiovascular:     Rate and Rhythm: Normal rate and regular rhythm.     Heart sounds: No murmur. No friction rub. No gallop.   Pulmonary:     Effort: Pulmonary effort is normal. No respiratory distress.     Breath sounds: Normal breath sounds. No stridor. No rales.  Abdominal:     General: There is no distension.     Palpations: Abdomen is soft.     Tenderness: There is no abdominal tenderness.  Musculoskeletal:        General: No tenderness.     Cervical back: Normal range of motion and neck supple.  Skin:    General: Skin is warm and dry.     Findings: No erythema or rash.  Neurological:     Mental Status: She is alert and oriented to person, place, and time.     ED Results and Treatments Labs (all labs ordered are listed, but only abnormal results are displayed) Labs Reviewed  COMPREHENSIVE METABOLIC PANEL - Abnormal; Notable for the following components:      Result Value   Glucose, Bld 124 (*)    Albumin 3.3 (*)    All other components within normal limits  CBC  LIPASE, BLOOD  PREGNANCY, URINE  TROPONIN I (HIGH SENSITIVITY)                                                                                                                          EKG  EKG Interpretation  Date/Time:  Wednesday January 17 2020 04:41:15 EST Ventricular Rate:  67 PR Interval:    QRS Duration: 105 QT Interval:  414 QTC Calculation: 437 R Axis:   56 Text Interpretation: Sinus rhythm Low voltage, precordial leads NO STEMI. No old tracing to compare Confirmed by Addison Lank [phone]) on 01/17/2020 5:27:14 AM      Radiology DG Chest Port 1 View  Result Date: 01/17/2020 CLINICAL DATA:  Chest pain radiating to the back for 3 days. EXAM: PORTABLE CHEST 1 VIEW COMPARISON:  06/18/2012 FINDINGS: Normal heart size and mediastinal contours. No acute infiltrate or edema. No effusion or pneumothorax. No acute osseous findings. IMPRESSION: Negative chest. Electronically Signed   By: Monte Fantasia M.D.   On: 01/17/2020 05:35    Pertinent labs & imaging results that were available during my care of the patient  were reviewed by me and considered in my medical decision making (see chart for details).  Medications Ordered in ED Medications  sodium chloride 0.9 % bolus 1,000 mL (1,000 mLs Intravenous New Bag/Given 01/17/20 0552)    Followed by  0.9 %  sodium chloride infusion (has no administration in time range)  alum & mag hydroxide-simeth (MAALOX/MYLANTA) 200-200-20 MG/5ML suspension 30 mL (30 mLs Oral Given 01/17/20 0459)    And  lidocaine (XYLOCAINE) 2 % viscous mouth solution 15 mL (15 mLs Oral Given 01/17/20 0459)  hyoscyamine (LEVSIN SL) SL tablet 0.25 mg (0.25 mg Sublingual Given 01/17/20 0459)  metoCLOPramide (REGLAN) injection 10 mg (10 mg Intravenous Given 01/17/20 0553)  morphine 4 MG/ML injection 4 mg (4 mg Intravenous Given 01/17/20 0553)                                                                                                                                    Procedures .1-3 Lead EKG Interpretation Performed by: Fatima Blank, MD Authorized by: Fatima Blank, MD     Interpretation: normal     ECG rate:   67   ECG rate assessment: normal     Rhythm: sinus rhythm     Ectopy: none     Conduction: normal    Ultrasound ED Abd  Date/Time: 01/17/2020 6:46 AM Performed by: Fatima Blank, MD Authorized by: Fatima Blank, MD   Procedure details:    Indications: abdominal pain     Assessment for:  Gallstones   Hepatobiliary:  Visualized       Hepatobiliary findings:    Common bile duct:  Normal   Gallbladder wall:  Normal   Gallbladder stones: not identified     Intra-abdominal fluid: not identified     Sonographic Murphy's sign: negative   Ultrasound ED Abd  Date/Time: 01/17/2020 6:47 AM Performed by: Fatima Blank, MD Authorized by: Fatima Blank, MD   Procedure details:    Indications: abdominal pain     Assessment for:  Hydronephrosis   Left renal:  Visualized   Right renal:  Visualized       Left renal findings:    Hydronephrosis: none   Right renal findings:    Hydronephrosis: none      (including critical care time)  Medical Decision Making / ED Course I have reviewed the nursing notes for this encounter and the patient's prior records (if available in EHR or on provided paperwork).   Analese Lamay was evaluated in Emergency Department on 01/17/2020 for the symptoms described in the history of present illness. She was evaluated in the context of the global COVID-19 pandemic, which necessitated consideration that the patient might be at risk for infection with the SARS-CoV-2 virus that causes COVID-19. Institutional protocols and algorithms that pertain to the evaluation of patients at risk for COVID-19 are in a state of rapid change based on information released  by regulatory bodies including the CDC and federal and state organizations. These policies and algorithms were followed during the patient's care in the ED.  Substernal/epigastric pain. No tenderness to palpation. Patient is Covid positive.  Assess for cardiac  etiology, lower lobe pneumonia, pancreatitis versus biliary disease.  Other possible etiologies gastritis.   Atypical chest pain.  EKG without acute ischemic changes or evidence of pericarditis.  Troponin drawn 4 hours after pain onset negative.  Heart score less than 3.  This is sufficient to rule out ACS in this low risk patient. Low suspicion for pulmonary embolism.  Presentation not classic for aortic dissection or esophageal perforation. Chest x-ray without evidence suggestive of pneumonia, pneumothorax, pneumomediastinum.  No abnormal contour of the mediastinum to suggest dissection. No evidence of acute injuries.  Labs grossly reassuring without leukocytosis or anemia.  No significant electrolyte derangements or renal sufficiency.  No evidence of bili obstruction or pancreatitis. Bedside ultrasound without evidence of gallstones or acute cholecystitis.  Low suspicion for serious intra-abdominal inflammatory/infectious process.  UPT negative.  Patient treated symptomatically with medication as noted above.  Improved pain.  Able to tolerate oral intake.      Final Clinical Impression(s) / ED Diagnoses Final diagnoses:  Chest pain  Epigastric pain   The patient appears reasonably screened and/or stabilized for discharge and I doubt any other medical condition or other [facility] requiring further screening, evaluation, or treatment in the ED at this time prior to discharge. Safe for discharge with strict return precautions.  Disposition: Discharge  Condition: Good  I have discussed the results, Dx and Tx plan with the patient/family who expressed understanding and agree(s) with the plan. Discharge instructions discussed at length. The patient/family was given strict return precautions who verbalized understanding of the instructions. No further questions at time of discharge.    ED Discharge Orders         Ordered    ondansetron (ZOFRAN ODT) 4 MG disintegrating tablet  Every 8 hours  PRN     01/17/20 0653    Hyoscyamine Sulfate SL (LEVSIN/SL) 0.125 MG SUBL  4 times daily PRN     01/17/20 0653            Follow Up: Lavada Mesi [address] [place] [address] [phone]  Call  As needed      This chart was dictated using voice recognition software.  Despite best efforts to proofread,  errors can occur which can change the documentation meaning.   Fatima Blank, MD 01/17/20 [phone]

## 2020-01-17 NOTE — Discharge Instructions (Addendum)

## 2020-01-18 ENCOUNTER — Encounter: Payer: Self-pay | Admitting: Physician Assistant

## 2020-01-18 ENCOUNTER — Telehealth: Payer: Self-pay | Admitting: Neurology

## 2020-01-18 NOTE — Telephone Encounter (Signed)
Donella Stade, PA-C sent to Annamaria Helling, CMA  Check in on patient and see how she is doing after ED visit for chest pain.    Left message on machine for patient to call back to see how she is doing.

## 2020-01-31 MED FILL — LEVOCETIRIZINE 5 MG TABLET: 5 | 90 days supply | Qty: 90 | Fill #1

## 2020-02-02 MED FILL — AZELASTINE 0.15% NASAL SPRY: 0.15 | 25 days supply | Qty: 30 | Fill #1

## 2020-02-02 MED FILL — SAXENDA 18 MG/3 ML PEN: 18 | 30 days supply | Qty: 15 | Fill #1

## 2020-02-02 MED FILL — OLOPATADINE HCL 0.2 % SOLN: 0.2 | 25 days supply | Qty: 3 | Fill #1

## 2020-02-27 MED FILL — TRI FEMYNOR 28 TABLET: 0.18/0.215/ | 84 days supply | Qty: 84 | Fill #0

## 2020-04-03 ENCOUNTER — Ambulatory Visit: Payer: No Typology Code available for payment source | Admitting: Allergy and Immunology

## 2020-04-28 ENCOUNTER — Emergency Department
Admission: RE | Admit: 2020-04-28 | Discharge: 2020-04-28 | Disposition: A | Discharge status: Home / Self Care | Payer: No Typology Code available for payment source | Source: Ambulatory Visit

## 2020-04-28 ENCOUNTER — Other Ambulatory Visit: Payer: Self-pay

## 2020-04-28 ENCOUNTER — Emergency Department
Admission: EM | Admit: 2020-04-28 | Discharge: 2020-04-28 | Disposition: A | Discharge status: Home / Self Care | Payer: No Typology Code available for payment source | Source: Home / Self Care | Attending: Family Medicine | Admitting: Family Medicine

## 2020-04-28 DIAGNOSIS — J029 Acute pharyngitis, unspecified: Secondary | ICD-10-CM

## 2020-04-28 LAB — POCT RAPID STREP A (OFFICE): Rapid Strep A Screen: NEGATIVE

## 2020-04-28 MED ORDER — FLUCONAZOLE 150 MG PO TABS: ORAL_TABLET | ORAL | 0 refills | Status: DC

## 2020-04-28 MED ORDER — PENICILLIN V POTASSIUM 500 MG PO TABS: ORAL_TABLET | ORAL | 0 refills | Status: DC

## 2020-04-28 NOTE — Discharge Instructions (Addendum)
Try warm salt water gargles for sore throat.  ?May take Ibuprofen 200mg, 4 tabs every 8 hours with food.  ?

## 2020-04-28 NOTE — ED Triage Notes (Signed)
Pt c/o sore throat and tired feeling since last week when she went to the country music festival in [place]. Last night she developed a fever of 103.4 (tympanic) Has had both covid vaccines and also had Covid in March.  Taking Alkaseltzer and tylenol prn. Last dose right before she came to UC. Hx strep.

## 2020-04-28 NOTE — ED Provider Notes (Signed)
Kayla Campos C[facility]    CSN: [id number] Arrival date & time: 04/28/20  1345      History   Chief Complaint Chief Complaint  Patient presents with  . Sore Throat  . Fever    HPI Kayla Campos is a 34 y.o. female.   Patient developed sore throat, fatigue, and mild cough about a week ago.  Her cough has improved but her sore throat became significantly worse yesterday.  Last night she had chills with fever to 103.4.  She states that she has a history of strep pharyngitis.   The history is provided by the patient.    Past Medical History:  Diagnosis Date  . Asthma   . Eczema   . Hypertension   . Pleurisy   . PNA (pneumonia)   . Urticaria     Patient Active Problem List   Diagnosis Date Noted  . Allergic conjunctivitis 01/03/2020  . Mild persistent asthma 01/03/2020  . Food allergy 01/03/2020  . Binge-eating disorder, mild 12/12/2019  . Perennial and seasonal allergic rhinitis 12/12/2019  . Wheezing 12/12/2019  . Hematuria 02/26/2016  . Stress 10/29/2015  . Abnormal weight gain 10/29/2015  . Hair loss 10/29/2015  . Morbid obesity ([place]) 10/29/2015  . BMI 36.0-36.9,adult 12/11/2014  . Hyperpigmentation of skin of cheek 12/11/2014  . Fatty liver disease, nonalcoholic 65/68/1275  . Bloating 05/25/2014  . Gastroesophageal reflux disease without esophagitis 05/25/2014  . Nausea alone 05/25/2014  . Essential hypertension, benign 05/25/2014  . Keratosis pilaris 10/11/2013    Past Surgical History:  Procedure Laterality Date  . WISDOM TOOTH EXTRACTION  2008    OB History   No obstetric history on file.      Home Medications    Prior to Admission medications   Medication Sig Start Date End Date Taking? Authorizing Provider  albuterol (VENTOLIN HFA) 108 (90 Base) MCG/ACT inhaler Inhale 2 puffs into the lungs every 6 (six) hours as needed for up to 10 days for wheezing or shortness of breath. 12/12/19 12/22/19  Donella Stade, PA-C  Biotin 10000  MCG TABS Take by mouth.    [provider]  cetirizine (ZYRTEC) 10 MG chewable tablet Chew 1 tablet (10 mg total) by mouth daily. Patient not taking: Reported on 01/03/2020 08/02/17   Donella Stade, PA-C  EPINEPHrine (AUVI-Q) 0.3 mg/0.3 mL IJ SOAJ injection Inject 0.3 mLs (0.3 mg total) into the muscle as needed for anaphylaxis. 01/03/20   Bobbitt, Sedalia Muta, MD  fluconazole (DIFLUCAN) 150 MG tablet Take one tab PO.  Repeat in 72 hours. 04/28/20   Kandra Nicolas, MD  Hyoscyamine Sulfate SL (LEVSIN/SL) 0.125 MG SUBL Place 1 each under the tongue 4 (four) times daily as needed for up to 5 days. 01/17/20 01/22/20  Fatima Blank, MD  levocetirizine (XYZAL) 5 MG tablet Take 1 tablet (5 mg total) by mouth daily as needed for allergies. 01/03/20   Bobbitt, Sedalia Muta, MD  lisdexamfetamine (VYVANSE) 10 MG capsule Take 1 capsule (10 mg total) by mouth daily. 12/12/19   Breeback, Royetta Car, PA-C  lisinopril-hydrochlorothiazide (ZESTORETIC) 10-12.5 MG tablet Take 1 tablet by mouth daily. 12/12/19   Breeback, Jade L, PA-C  montelukast (SINGULAIR) 10 MG tablet Take 1 tablet (10 mg total) by mouth at bedtime. Patient not taking: Reported on 01/03/2020 12/12/19   Donella Stade, PA-C  Norgestimate-Ethinyl Estradiol Triphasic (ORTHO TRI-CYCLEN LO) 0.18/0.215/0.25 MG-25 MCG tablet Take 1 tablet by mouth daily.    [provider]  Olopatadine HCl (PATADAY) 0.2 % SOLN Place 1 drop into both eyes daily as needed. 01/03/20   Bobbitt, Sedalia Muta, MD  penicillin v potassium (VEETID) 500 MG tablet Take one tab by mouth twice daily for 10 days 04/28/20   Kandra Nicolas, MD  TECHLITE PEN NEEDLES 32G X 6 MM MISC Patient hasn't starting using these needles for weight loss medication 12/20/19   [provider]    Family History Family History  Problem Relation Age of Onset  . Hypertension Mother   . Diabetes Father   . Hypertension Father   . Coronary artery disease Father   .  Hyperlipidemia Father   . Hypertension Sister   . Allergic rhinitis Sister   . Eczema Sister   . Urticaria Sister   . Hyperlipidemia Maternal Aunt   . Heart attack Maternal Grandmother   . Heart attack Maternal Grandfather   . Diabetes Maternal Grandfather   . Stroke Maternal Grandfather   . Cancer Paternal Grandmother   . Heart attack Paternal Grandmother   . Diabetes Paternal Grandmother   . Asthma Neg Hx   . Angioedema Neg Hx     Social History Social History   Tobacco Use  . Smoking status: Never Smoker  . Smokeless tobacco: Never Used  Vaping Use  . Vaping Use: Never used  Substance Use Topics  . Alcohol use: No  . Drug use: No     Allergies   Keflex [cephalexin]   Review of Systems Review of Systems  + sore throat + cough, resolved No pleuritic pain No wheezing No nasal congestion No post-nasal drainage No sinus pain/pressure No itchy/red eyes No earache No hemoptysis No SOB + fever/chills No nausea No vomiting No abdominal pain No diarrhea No urinary symptoms No skin rash + fatigue No myalgias No headache    Physical Exam Triage Vital Signs ED Triage Vitals [04/28/20 1410]  Enc Vitals Group     BP 122/79     Pulse Rate 95     Resp 18     Temp 98.9 F (37.2 C)     Temp Source Oral     SpO2 97 %     Weight 229 lb (103.9 kg)     Height 5\' 6"  (1.676 m)     Head Circumference      Peak Flow      Pain Score 2     Pain Loc      Pain Edu?      Excl. in [place]?    No data found.  Updated Vital Signs BP 122/79 (BP Location: Right Arm)   Pulse 95   Temp 98.9 F (37.2 C) (Oral)   Resp 18   Ht 5\' 6"  (1.676 m)   Wt 103.9 kg   LMP 04/23/2020 (Approximate)   SpO2 97%   BMI 36.96 kg/m   Visual Acuity Right Eye Distance:   Left Eye Distance:   Bilateral Distance:    Right Eye Near:   Left Eye Near:    Bilateral Near:     Physical Exam Nursing notes and Vital Signs reviewed. Appearance:  Patient appears stated age, and in no  acute distress Eyes:  Pupils are equal, round, and reactive to light and accomodation.  Extraocular movement is intact.  Conjunctivae are not inflamed  Ears:  Canals normal.  Tympanic membranes normal.  Nose:  Normal turbinates.  No sinus tenderness.   Pharynx:  Large bilaterally erythematous tonsils with exudate. Neck:  Supple.  Tonsillar nodes are enlarged and tender to palpation bilaterally.  Lungs:  Clear to auscultation.  Breath sounds are equal.  Moving air well. Heart:  Regular rate and rhythm without murmurs, rubs, or gallops.  Abdomen:  Nontender without masses or hepatosplenomegaly.  Bowel sounds are present.  No CVA or flank tenderness.  Extremities:  No edema.  Skin:  No rash present.   UC Treatments / Results  Labs (all labs ordered are listed, but only abnormal results are displayed) Labs Reviewed  STREP A DNA PROBE  POCT RAPID STREP A (OFFICE) negative    EKG   Radiology No results found.  Procedures Procedures (including critical care time)  Medications Ordered in UC Medications - No data to display  Initial Impression / Assessment and Plan / UC Course  I have reviewed the triage vital signs and the nursing notes.  Pertinent labs & imaging results that were available during my care of the patient were reviewed by me and considered in my medical decision making (see chart for details).    CENTOR 4; ?false negative rapid strep test. Begin PenVK (patient reports no adverse effects to amoxicillin).  Given Rx for Diflucan at patient's request. Followup with Family Doctor if not improved in 10 days.   Final Clinical Impressions(s) / UC Diagnoses   Final diagnoses:  Acute pharyngitis, unspecified etiology  Pharyngitis, unspecified etiology     Discharge Instructions     Try warm salt water gargles for sore throat.  May take Ibuprofen 200mg , 4 tabs every 8 hours with food.     ED Prescriptions    Medication Sig Dispense Auth. Provider   penicillin v  potassium (VEETID) 500 MG tablet Take one tab by mouth twice daily for 10 days 20 tablet Kandra Nicolas, MD   fluconazole (DIFLUCAN) 150 MG tablet Take one tab PO.  Repeat in 72 hours. 2 tablet Kandra Nicolas, MD        Kandra Nicolas, MD 05/03/20 [phone]

## 2020-04-29 LAB — STREP A DNA PROBE: Group A Strep Probe: NOT DETECTED

## 2020-05-02 MED FILL — FLUCONAZOLE 150 MG TABS: 150 | 4 days supply | Qty: 2 | Fill #0

## 2020-05-02 MED FILL — TRIAMCINOLONE 0.1% CREAM: 0.1 | 7 days supply | Qty: 15 | Fill #0

## 2020-05-02 MED FILL — NYSTATIN 100,000 UNIT/GM CR: 100000 | 7 days supply | Qty: 15 | Fill #0

## 2020-05-06 MED FILL — TRI-PREVIFEM 0.18/0.215/0.2: 0.18/0.215/ | 84 days supply | Qty: 84 | Fill #0

## 2020-05-06 MED FILL — LEVOCETIRIZINE 5 MG TABLET: 5 | 60 days supply | Qty: 60 | Fill #2

## 2020-06-17 ENCOUNTER — Encounter: Payer: Self-pay | Admitting: Physician Assistant

## 2020-06-17 NOTE — Telephone Encounter (Signed)
Routing to covering provider.  °

## 2020-06-18 NOTE — Telephone Encounter (Signed)
Message answered directly to patient.

## 2020-07-02 ENCOUNTER — Other Ambulatory Visit (HOSPITAL_COMMUNITY): Payer: Self-pay | Admitting: Physician Assistant

## 2020-07-02 ENCOUNTER — Other Ambulatory Visit: Payer: Self-pay | Admitting: Physician Assistant

## 2020-07-02 ENCOUNTER — Other Ambulatory Visit: Payer: Self-pay | Admitting: Allergy and Immunology

## 2020-07-02 ENCOUNTER — Encounter: Payer: Self-pay | Admitting: Physician Assistant

## 2020-07-02 MED ORDER — LEVOCETIRIZINE DIHYDROCHLORIDE 5 MG PO TABS: 5.0000 mg | ORAL_TABLET | Freq: Every day | ORAL | 3 refills | Status: DC | PRN

## 2020-07-02 MED FILL — LISINOPRIL-HCTZ 10-12.5 MG: 10-12.5 | 90 days supply | Qty: 90 | Fill #2

## 2020-07-02 MED FILL — MONTELUKAST SOD 10 MG TAB: 10 | 90 days supply | Qty: 90 | Fill #2

## 2020-07-02 MED FILL — LEVOCETIRIZINE 5 MG TABLET: 5 | 90 days supply | Qty: 90 | Fill #0

## 2020-07-02 NOTE — Telephone Encounter (Signed)
Rx written by external provider. Rx pended.

## 2020-07-29 MED FILL — TRI-PREVIFEM 0.18/0.215/0.2: 0.18/0.215/ | 84 days supply | Qty: 84 | Fill #1

## 2020-07-30 ENCOUNTER — Ambulatory Visit (INDEPENDENT_AMBULATORY_CARE_PROVIDER_SITE_OTHER): Payer: No Typology Code available for payment source | Admitting: Physician Assistant

## 2020-07-30 ENCOUNTER — Other Ambulatory Visit: Payer: Self-pay

## 2020-07-30 ENCOUNTER — Encounter: Payer: Self-pay | Admitting: Physician Assistant

## 2020-07-30 ENCOUNTER — Other Ambulatory Visit (HOSPITAL_COMMUNITY): Payer: Self-pay | Admitting: Physician Assistant

## 2020-07-30 VITALS — BP 115/67 | HR 79 | Ht 66.0 in | Wt 220.0 lb

## 2020-07-30 DIAGNOSIS — Z79899 Other long term (current) drug therapy: Secondary | ICD-10-CM | POA: Diagnosis not present

## 2020-07-30 DIAGNOSIS — L659 Nonscarring hair loss, unspecified: Secondary | ICD-10-CM | POA: Diagnosis not present

## 2020-07-30 DIAGNOSIS — E6609 Other obesity due to excess calories: Secondary | ICD-10-CM

## 2020-07-30 DIAGNOSIS — E7800 Pure hypercholesterolemia, unspecified: Secondary | ICD-10-CM | POA: Diagnosis not present

## 2020-07-30 DIAGNOSIS — L80 Vitiligo: Secondary | ICD-10-CM

## 2020-07-30 DIAGNOSIS — Z6835 Body mass index (BMI) 35.0-35.9, adult: Secondary | ICD-10-CM

## 2020-07-30 MED ORDER — CLOBETASOL PROPIONATE 0.05 % EX CREA: 1.0000 "application " | TOPICAL_CREAM | Freq: Two times a day (BID) | CUTANEOUS | 1 refills | Status: DC

## 2020-07-30 MED ORDER — SAXENDA 18 MG/3ML ~~LOC~~ SOPN: 3.0000 mg | PEN_INJECTOR | Freq: Every day | SUBCUTANEOUS | 1 refills | Status: DC

## 2020-07-30 MED FILL — CLOBETASOL PROPIONATE 0.05: 0.05 | 30 days supply | Qty: 60 | Fill #0

## 2020-07-30 NOTE — Patient Instructions (Signed)
Decrease to 1/2 tablet of lisinopril/HCTz if BP is still under 130/90 then can stop.

## 2020-07-30 NOTE — Progress Notes (Addendum)
Subjective:    Patient ID: Kayla Campos, female    DOB: Jul 11, 1986, 34 y.o.   MRN: [id number]  HPI 34 year old female presenting for a weight check for Saxcenda use.  Worried about hair loss. Wanted to know what might be happening and whether it is due to history of PCOS, Lisinopril use, or weight change. Diffuse shedding over some time. No clumps Vitaligo has become a problem over the last two years. Punch biopsy confirmed diagnosis of vitiligo 2 years ago. It has progressed over the past two years spreading from pt labia to gluteal cleft and has more recently appeared on her feet. She is anxious that it will still spread.  Diet octivia= 2-3 hours small meal and then night a lean protein veggies.   .. Active Ambulatory Problems    Diagnosis Date Noted   Keratosis pilaris 10/11/2013   Bloating 05/25/2014   Gastroesophageal reflux disease without esophagitis 05/25/2014   Nausea alone 05/25/2014   Essential hypertension, benign 05/25/2014   Fatty liver disease, nonalcoholic 41/93/7902   BMI 36.0-36.9,adult 12/11/2014   Hyperpigmentation of skin of cheek 12/11/2014   Stress 10/29/2015   Abnormal weight gain 10/29/2015   Hair loss 10/29/2015   Class 2 obesity due to excess calories without serious comorbidity with body mass index (BMI) of 35.0 to 35.9 in adult 10/29/2015   Hematuria 02/26/2016   Binge-eating disorder, mild 12/12/2019   Perennial and seasonal allergic rhinitis 12/12/2019   Wheezing 12/12/2019   Allergic conjunctivitis 01/03/2020   Mild persistent asthma 01/03/2020   Food allergy 01/03/2020   Elevated LDL cholesterol level 07/30/2020   Vitiligo 07/30/2020   Resolved Ambulatory Problems    Diagnosis Date Noted   BMI 40.0-44.9, adult ([place]) 10/11/2013   Past Medical History:  Diagnosis Date   Asthma    Eczema    Hypertension    Pleurisy    PNA (pneumonia)    Urticaria      Review of Systems  Gastrointestinal: Positive  for nausea. Negative for abdominal pain, blood in stool, constipation and diarrhea.       Nausea only when she over eats  Skin: Positive for color change.       New areas of vitiligo on feet        Objective:   Physical Exam Constitutional:      Appearance: Normal appearance. She is obese.  Cardiovascular:     Rate and Rhythm: Normal rate and regular rhythm.     Pulses: Normal pulses.     Heart sounds: Normal heart sounds.  Pulmonary:     Effort: Pulmonary effort is normal.     Breath sounds: Normal breath sounds.  Musculoskeletal:       Feet:  Feet:     Right foot:     Skin integrity: Skin integrity normal.     Left foot:     Skin integrity: Skin integrity normal.  Skin:    General: Skin is warm and dry.  Neurological:     Mental Status: She is alert.           Assessment & Plan:  .Marland KitchenMonaca was seen today for weight check.  Diagnoses and all orders for this visit:  Class 2 obesity due to excess calories without serious comorbidity with body mass index (BMI) of 35.0 to 35.9 in adult -     COMPLETE METABOLIC PANEL WITH GFR -     Liraglutide -Weight Management (SAXENDA) 18 MG/3ML SOPN; Inject 3 mg into the  skin daily.  Elevated LDL cholesterol level -     Lipid Panel w/reflex Direct LDL  Medication management -     COMPLETE METABOLIC PANEL WITH GFR  Hair loss -     TSH  Vitiligo -     clobetasol cream (TEMOVATE) 0.05 %; Apply 1 application topically 2 (two) times daily. -     Ambulatory referral to Rheumatology   Lost 50lbs on saxenda. Sent refills for 6 months 90 day supply. Continue diet and exercise changes.  Recheck labs for lipids, cmp, cbc, TSH. Need to evaluate for hair loss. Discussed some hair loss could be due to her weight change. No major findings on PE.  BP a little low. Decrease to 1/2 tablet of lisinopril/HCTZ and if BP under 130/90 stop.  Topical steroid given for vitiligo. Referral made to rheumatology since dermatology has not done a lot  for her.  Marland KitchenVernetta Honey PA-C, have reviewed and agree with the above documentation in it's entirety.

## 2020-07-31 ENCOUNTER — Telehealth: Payer: Self-pay | Admitting: Physician Assistant

## 2020-07-31 LAB — COMPLETE METABOLIC PANEL WITH GFR
AG Ratio: 1.3 (calc) (ref 1.0–2.5)
ALT: 12 U/L (ref 6–29)
AST: 16 U/L (ref 10–30)
Albumin: 4.3 g/dL (ref 3.6–5.1)
Alkaline phosphatase (APISO): 43 U/L (ref 31–125)
BUN: 13 mg/dL (ref 7–25)
CO2: 25 mmol/L (ref 20–32)
Calcium: 9.9 mg/dL (ref 8.6–10.2)
Chloride: 102 mmol/L (ref 98–110)
Creat: 0.62 mg/dL (ref 0.50–1.10)
GFR, Est African American: 135 mL/min/{1.73_m2} (ref >=60)
GFR, Est Non African American: 117 mL/min/{1.73_m2} (ref >=60)
Globulin: 3.2 g/dL (calc) (ref 1.9–3.7)
Glucose, Bld: 84 mg/dL (ref 65–99)
Potassium: 4.5 mmol/L (ref 3.5–5.3)
Sodium: 136 mmol/L (ref 135–146)
Total Bilirubin: 0.6 mg/dL (ref 0.2–1.2)
Total Protein: 7.5 g/dL (ref 6.1–8.1)

## 2020-07-31 LAB — LIPID PANEL W/REFLEX DIRECT LDL
Cholesterol: 244 mg/dL — ABNORMAL HIGH (ref <200)
HDL: 59 mg/dL (ref >=50)
LDL Cholesterol (Calc): 159 mg/dL (calc) — ABNORMAL HIGH
Non-HDL Cholesterol (Calc): 185 mg/dL (calc) — ABNORMAL HIGH (ref <130)
Total CHOL/HDL Ratio: 4.1 (calc) (ref <5.0)
Triglycerides: 131 mg/dL (ref <150)

## 2020-07-31 LAB — TSH: TSH: 0.79 mIU/L

## 2020-07-31 NOTE — Telephone Encounter (Signed)
Received fax for PA on Saxenda sent through cover my meds waiting on determination. - CF 

## 2020-07-31 NOTE — Progress Notes (Signed)
Marnita,   LDL keeps going up and HDL went down this time.  Overall CV risk at this age is still low but at 160 LDL we do suggest low dose statin. I would start red yeast rice OTC first. We can recheck in 1 year.  Thyroid good.  Kidney, liver, glucose good.

## 2020-08-01 ENCOUNTER — Encounter: Payer: Self-pay | Admitting: Physician Assistant

## 2020-08-02 MED FILL — TECHLITE PEN NDL 32GX1/4: 32G X 6 MM | 90 days supply | Qty: 100 | Fill #1

## 2020-08-02 MED FILL — SAXENDA 18 MG/3 ML PEN: 18 | 90 days supply | Qty: 45 | Fill #0

## 2020-08-05 NOTE — Telephone Encounter (Signed)
Received fax from [place] they approved coverage on Saxenda.   PA Reference Number: [id number] Valid: 08/01/2020 - 12/01/2019  I have notified the pharmacy so they can get medication ready for patient. - CF

## 2020-08-05 NOTE — Telephone Encounter (Signed)
Received fax today from [place] was approved. - CF

## 2020-10-09 MED FILL — LEVOCETIRIZINE 5 MG TABLET: 5 | 90 days supply | Qty: 90 | Fill #0

## 2020-10-09 MED FILL — LISINOPRIL-HCTZ 10-12.5 MG: 10-12.5 | 90 days supply | Qty: 90 | Fill #0

## 2020-10-09 MED FILL — TRI FEMYNOR 28 TABLET: 0.18/0.215/ | 84 days supply | Qty: 84 | Fill #0

## 2020-10-09 MED FILL — MONTELUKAST SOD 10 MG TAB: 10 | 90 days supply | Qty: 90 | Fill #0

## 2020-10-16 ENCOUNTER — Encounter: Payer: Self-pay | Admitting: Physician Assistant

## 2020-10-18 ENCOUNTER — Encounter: Payer: Self-pay | Admitting: Physician Assistant

## 2020-10-18 ENCOUNTER — Other Ambulatory Visit (HOSPITAL_COMMUNITY): Payer: Self-pay | Admitting: Physician Assistant

## 2020-10-18 ENCOUNTER — Telehealth (INDEPENDENT_AMBULATORY_CARE_PROVIDER_SITE_OTHER): Payer: No Typology Code available for payment source | Admitting: Physician Assistant

## 2020-10-18 VITALS — BP 120/78 | HR 85 | Temp 99.0°F | Ht 66.0 in | Wt 208.0 lb

## 2020-10-18 DIAGNOSIS — J3089 Other allergic rhinitis: Secondary | ICD-10-CM

## 2020-10-18 DIAGNOSIS — J4530 Mild persistent asthma, uncomplicated: Secondary | ICD-10-CM

## 2020-10-18 DIAGNOSIS — F419 Anxiety disorder, unspecified: Secondary | ICD-10-CM | POA: Insufficient documentation

## 2020-10-18 DIAGNOSIS — Z630 Problems in relationship with spouse or partner: Secondary | ICD-10-CM

## 2020-10-18 DIAGNOSIS — F439 Reaction to severe stress, unspecified: Secondary | ICD-10-CM | POA: Diagnosis not present

## 2020-10-18 DIAGNOSIS — F410 Panic disorder [episodic paroxysmal anxiety] without agoraphobia: Secondary | ICD-10-CM

## 2020-10-18 MED ORDER — CLONAZEPAM 0.5 MG PO TABS: 0.5000 mg | ORAL_TABLET | Freq: Two times a day (BID) | ORAL | 0 refills | Status: DC | PRN

## 2020-10-18 MED ORDER — CITALOPRAM HYDROBROMIDE 10 MG PO TABS: 10.0000 mg | ORAL_TABLET | Freq: Every day | ORAL | 1 refills | Status: DC

## 2020-10-18 MED FILL — CITALOPRAM HBR 10 MG TABLET: 10 | 30 days supply | Qty: 30 | Fill #0

## 2020-10-18 MED FILL — clonazePAM 0.5 MG TABS: 0.5 | 10 days supply | Qty: 20 | Fill #0

## 2020-10-18 NOTE — Progress Notes (Signed)
Patient ID: Kayla Campos, female   DOB: Apr 29, 1986, 34 y.o.   MRN: [id number] .Marland KitchenVirtual Visit via Telephone Note  I connected with Kayla Campos on 10/18/20 at  9:50 AM EST by telephone and verified that I am speaking with the correct person using two identifiers.  Location: Patient: work Provider: clinic  .Marland KitchenParticipating in visit:  Patient: Joycelyn Das Provider: Iran Planas PA-C Provider in training: Ola Spurr PA-Student    I discussed the limitations, risks, security and privacy concerns of performing an evaluation and management service by telephone and the availability of in person appointments. I also discussed with the patient that there may be a patient responsible charge related to this service. The patient expressed understanding and agreed to proceed.   History of Present Illness: Patient is a 34 year old female with hypertension, asthma, and seasonal and perennial allergies who calls into the clinic wanting to discuss possible panic attacks.  She has had some overall anxiety for most of her life but she has been able to control it by herself without medication.  She has never had any panic attacks.  She believes that is what she has had 2 of since March.  She describes events last 20 minutes with chest tightness, pending doom feeling. She does have asthma and she thought both of them were asthma attacks at first but albuterol inhaler did not help like it usually does. She has been on singulair and wondered if it caused the attacks but she has been on daily for the last 6 months. She left her husband that she has been with for 15 years. She feels good about the change but hates hurting him.   .. Active Ambulatory Problems    Diagnosis Date Noted  . Keratosis pilaris 10/11/2013  . Bloating 05/25/2014  . Gastroesophageal reflux disease without esophagitis 05/25/2014  . Nausea alone 05/25/2014  . Essential hypertension, benign 05/25/2014  . Fatty  liver disease, nonalcoholic 17/40/8144  . BMI 36.0-36.9,adult 12/11/2014  . Hyperpigmentation of skin of cheek 12/11/2014  . Stress 10/29/2015  . Abnormal weight gain 10/29/2015  . Hair loss 10/29/2015  . Class 2 obesity due to excess calories without serious comorbidity with body mass index (BMI) of 35.0 to 35.9 in adult 10/29/2015  . Hematuria 02/26/2016  . Binge-eating disorder, mild 12/12/2019  . Perennial and seasonal allergic rhinitis 12/12/2019  . Wheezing 12/12/2019  . Allergic conjunctivitis 01/03/2020  . Mild persistent asthma 01/03/2020  . Food allergy 01/03/2020  . Elevated LDL cholesterol level 07/30/2020  . Vitiligo 07/30/2020  . Anxiety 10/18/2020   Resolved Ambulatory Problems    Diagnosis Date Noted  . BMI 40.0-44.9, adult ([place]) 10/11/2013   Past Medical History:  Diagnosis Date  . Asthma   . Eczema   . Hypertension   . Pleurisy   . PNA (pneumonia)   . Urticaria    Reviewed med, allergy, problem list.     Observations/Objective: No acute distress Able to speak in complete sentences.  No trouble breathing.   .. Today's Vitals   10/18/20 0945  BP: 120/78  Pulse: 85  Temp: 99 F (37.2 C)  TempSrc: Oral  Weight: 208 lb (94.3 kg)  Height: 5\' 6"  (1.676 m)   Body mass index is 33.57 kg/m.    .. Depression screen [facility] 2/9 10/18/2020 12/12/2019 01/23/2019 08/02/2017 10/11/2013  Decreased Interest 0 0 0 0 0  Down, Depressed, Hopeless 0 1 0 0 0  PHQ - 2 Score 0 1 0 0 0  Altered sleeping 1 1 - - -  Tired, decreased energy 1 1 - - -  Change in appetite 1 1 - - -  Feeling bad or failure about yourself  1 1 - - -  Trouble concentrating 3 1 - - -  Moving slowly or fidgety/restless 1 0 - - -  Suicidal thoughts 0 0 - - -  PHQ-9 Score 8 6 - - -  Difficult doing work/chores Somewhat difficult Not difficult at all - - -   .Marland Kitchen GAD 7 : Generalized Anxiety Score 10/18/2020 12/12/2019  Nervous, Anxious, on Edge 1 1  Control/stop worrying 1 0  Worry too much -  different things 1 0  Trouble relaxing 1 1  Restless 1 0  Easily annoyed or irritable 1 0  Afraid - awful might happen 0 0  Total GAD 7 Score 6 2  Anxiety Difficulty Extremely difficult Not difficult at all     Assessment and Plan: ...Blaike was seen today for anxiety.  Diagnoses and all orders for this visit:  Anxiety -     citalopram (CELEXA) 10 MG tablet; Take 1 tablet (10 mg total) by mouth daily. -     Ambulatory referral to Psychology  Stress at home -     citalopram (CELEXA) 10 MG tablet; Take 1 tablet (10 mg total) by mouth daily. -     Ambulatory referral to Psychology  Mild persistent asthma, unspecified whether complicated  Perennial and seasonal allergic rhinitis  Panic attacks -     clonazePAM (KLONOPIN) 0.5 MG tablet; Take 1 tablet (0.5 mg total) by mouth 2 (two) times daily as needed for anxiety. -     Ambulatory referral to Psychology  Marital problems -     Ambulatory referral to Psychology   Pt is going through quite a bit of stress with her seperation. Does not sound like asthma and asthma has been controlled. I would not stop singulair. I do not think it is causing her panic attacks.  Needs counseling. Started celexa 10mg  at bedtime. clonazepam started for panic attacks. Discussed only use for panic attacks due to dependency risk.  Follow up in 4-6 weeks.    Follow Up Instructions:    I discussed the assessment and treatment plan with the patient. The patient was provided an opportunity to ask questions and all were answered. The patient agreed with the plan and demonstrated an understanding of the instructions.   The patient was advised to call back or seek an in-person evaluation if the symptoms worsen or if the condition fails to improve as anticipated.  I provided 20 minutes of non-face-to-face time during this encounter.   Iran Planas, PA-C

## 2020-10-18 NOTE — Patient Instructions (Signed)
Panic Attack  A panic attack is when you suddenly feel very afraid, uncomfortable, or nervous (anxious). A panic attack can happen when you are scared or for no reason. A panic attack can feel like a serious problem. It can even feel like a heart attack or stroke. See your doctor when you have a panic attack to make sure you do not have a serious problem. Follow these instructions at home:  Take medicines only as told by your doctor.  If you feel worried or nervous, try not to have caffeine.  Take good care of your health. To do this: ? Eat healthy. Make sure to eat fresh fruits and vegetables, whole grains, lean meats, and low-fat dairy. ? Get enough sleep. Try to sleep for 7-8 hours each night. ? Exercise. Try to be active for 30 minutes 5 or more days a week. ? Do not smoke. Talk to your doctor if you need help quitting. ? Limit how much alcohol you drink:  If you are a woman who is not pregnant: try not to have more than 1 drink a day.  If you are a man: try not to have more than 2 drinks a day.  One drink equals 12 oz of beer, 5 oz of wine, or 1 oz of hard liquor.  Keep all follow-up visits as told by your doctor. This is important. Contact a doctor if:  Your symptoms do not get better.  Your symptoms get worse.  You are not able to take your medicines as told. Get help right away if:  You have thoughts of hurting yourself or others.  You have symptoms of a panic attack. Do not drive yourself to the hospital. Have someone else drive you or call an ambulance. If you feel like you may hurt yourself or others, or have thoughts about taking your own life, get help right away. You can go to your nearest emergency department or call:  Your local emergency services (911 in the [place]  A suicide crisis helpline, such as the National Suicide Prevention Lifeline at 1-800-273-8255. This is open 24 hours a day. Summary  A panic attack is when you suddenly feel very afraid,  uncomfortable, or nervous (anxious).  See your doctor when you have a panic attack to make sure that you do not have another serious problem.  If you feel like you may hurt yourself or others, get help right away by calling 911. This information is not intended to replace advice given to you by your health care provider. Make sure you discuss any questions you have with your health care provider. Document Revised: 10/08/2017 Document Reviewed: 12/09/2016 Elsevier Patient Education  2020 Elsevier Inc.  

## 2020-10-18 NOTE — Progress Notes (Signed)
Patient having panic attacks Has never happened in the past She is going through a separation right now PHQ9 (8) -GAD7 (6) completed.

## 2020-11-07 ENCOUNTER — Ambulatory Visit: Payer: No Typology Code available for payment source | Admitting: Psychology

## 2020-11-13 ENCOUNTER — Other Ambulatory Visit: Payer: Self-pay | Admitting: Physician Assistant

## 2020-11-13 ENCOUNTER — Other Ambulatory Visit (HOSPITAL_COMMUNITY): Payer: Self-pay | Admitting: Physician Assistant

## 2020-11-13 MED FILL — CITALOPRAM HBR 10 MG TABLET: 10 | 30 days supply | Qty: 30 | Fill #1

## 2020-11-13 MED FILL — SAXENDA 18 MG/3 ML PEN: 18 | 90 days supply | Qty: 45 | Fill #0

## 2020-11-13 MED FILL — UNIFINE PENTIPS 32GX5/32: 32G X 4 MM | 90 days supply | Qty: 100 | Fill #0

## 2020-11-19 NOTE — Progress Notes (Signed)
Office Visit Note  Patient: Kayla Campos             Date of Birth: 29-May-1986           MRN: [id number]             PCP: Lavada Mesi Referring: Lavada Mesi Visit Date: 12/03/2020 Occupation: @GUAROCC @  Subjective:  No chief complaint on file.   History of Present Illness: Liane Tribbey is a 35 y.o. female seen in consultation per request of her PCP.  According the patient 2019 she developed subcutaneous patch of vitiligo in her perineal region which gradually spread and involved her whole perineum and her gluteal cleft.  She states now she had few lesions on her feet.  She was seen by a dermatologist who gave her topical agents which did not help.  She also had alopecia areata lesion on her scalp in June 2020 which was treated with topical solutions and completely resolved.  She states over time she was been developing rash in the axillary region, under her breast and in the inguinal region.  She believes it is eczema.  That rash eventually resolved.  She also has history of keratosis pilaris.  She states she has been under a lot of stress and going through separation.  She was diagnosed with insulin resistance and was placed on Metformin which was later changed to Saxenda.  Her sister has IgA nephropathy.  Her brother possibly has vitiligo.  Is gravida 0  Activities of Daily Living:  Patient reports morning stiffness for 0 minutes.   Patient Denies nocturnal pain.  Difficulty dressing/grooming: Denies Difficulty climbing stairs: Denies Difficulty getting out of chair: Denies Difficulty using hands for taps, buttons, cutlery, and/or writing: Denies  Review of Systems  Constitutional: Negative for fatigue, night sweats, weight gain and weight loss.  HENT: Negative for mouth sores, trouble swallowing, trouble swallowing, mouth dryness and nose dryness.   Eyes: Negative for pain, redness, itching, visual disturbance and dryness.  Respiratory:  Negative for cough, shortness of breath and difficulty breathing.   Cardiovascular: Positive for palpitations. Negative for chest pain, hypertension, irregular heartbeat and swelling in legs/feet.  Gastrointestinal: Negative for blood in stool, constipation and diarrhea.  Endocrine: Negative for increased urination.  Genitourinary: Negative for vaginal dryness.  Musculoskeletal: Negative for arthralgias, joint pain, joint swelling, myalgias, muscle weakness, morning stiffness, muscle tenderness and myalgias.  Skin: Positive for hair loss and sensitivity to sunlight. Negative for color change, rash, redness, skin tightness and ulcers.  Allergic/Immunologic: Positive for susceptible to infections.  Neurological: Negative for dizziness, numbness, headaches, memory loss, night sweats and weakness.  Hematological: Negative for bruising/bleeding tendency and swollen glands.  Psychiatric/Behavioral: Negative for depressed mood, confusion and sleep disturbance. The patient is nervous/anxious.     PMFS History:  Patient Active Problem List   Diagnosis Date Noted  . Anxiety 10/18/2020  . Elevated LDL cholesterol level 07/30/2020  . Vitiligo 07/30/2020  . Allergic conjunctivitis 01/03/2020  . Mild persistent asthma 01/03/2020  . Food allergy 01/03/2020  . Binge-eating disorder, mild 12/12/2019  . Perennial and seasonal allergic rhinitis 12/12/2019  . Wheezing 12/12/2019  . Hematuria 02/26/2016  . Stress 10/29/2015  . Abnormal weight gain 10/29/2015  . Hair loss 10/29/2015  . Class 2 obesity due to excess calories without serious comorbidity with body mass index (BMI) of 35.0 to 35.9 in adult 10/29/2015  . BMI 36.0-36.9,adult 12/11/2014  . Hyperpigmentation of skin of cheek 12/11/2014  .  Fatty liver disease, nonalcoholic [id number]  . Bloating 05/25/2014  . Gastroesophageal reflux disease without esophagitis 05/25/2014  . Nausea alone 05/25/2014  . Essential hypertension, benign 05/25/2014   . Keratosis pilaris 10/11/2013    Past Medical History:  Diagnosis Date  . Alopecia    per patient   . Asthma   . Eczema   . Hypertension   . Insulin resistance    per patient   . Pleurisy   . PNA (pneumonia)   . Urticaria   . Vitiligo    per patient, dx by derm    Family History  Problem Relation Age of Onset  . Hypertension Mother   . Hypothyroidism Mother   . Diabetes Father   . Hypertension Father   . Coronary artery disease Father   . Hyperlipidemia Father   . Congestive Heart Failure Father   . Hypertension Sister   . Allergic rhinitis Sister   . Eczema Sister   . Urticaria Sister   . IgA nephropathy Sister   . Diabetes Sister   . Hyperlipidemia Maternal Aunt   . Heart attack Maternal Grandmother   . Heart attack Maternal Grandfather   . Diabetes Maternal Grandfather   . Stroke Maternal Grandfather   . Cancer Paternal Grandmother   . Heart attack Paternal Grandmother   . Diabetes Paternal Grandmother   . Healthy Brother   . Healthy Sister   . Asthma Neg Hx   . Angioedema Neg Hx    Past Surgical History:  Procedure Laterality Date  . [place] EXTRACTION  2008   Social History   Social History Narrative  . Not on file   Immunization History  Administered Date(s) Administered  . PFIZER(Purple Top)SARS-COV-2 Vaccination 11/08/2019, 11/29/2019, 08/23/2020     Objective: Vital Signs: BP 119/77   Pulse 62   Resp 16   Ht 5\' 6"  (1.676 m)   Wt 217 lb 12.8 oz (98.8 kg)   BMI 35.15 kg/m    Physical Exam Vitals and nursing note reviewed.  Constitutional:      Appearance: She is well-developed and well-nourished.  HENT:     Head: Normocephalic and atraumatic.  Eyes:     Extraocular Movements: EOM normal.     Conjunctiva/sclera: Conjunctivae normal.  Cardiovascular:     Rate and Rhythm: Normal rate and regular rhythm.     Pulses: Intact distal pulses.     Heart sounds: Normal heart sounds.  Pulmonary:     Effort: Pulmonary effort is  normal.     Breath sounds: Normal breath sounds.  Abdominal:     General: Bowel sounds are normal.     Palpations: Abdomen is soft.  Musculoskeletal:     Cervical back: Normal range of motion.  Lymphadenopathy:     Cervical: No cervical adenopathy.  Skin:    General: Skin is warm and dry.     Capillary Refill: Capillary refill takes less than 2 seconds.     Comments: hypopigmented vitiligo lesions noted on perineal region, gluteal cleft and on her feet. Keratosis pilaris noted on her bilateral arms.  Neurological:     Mental Status: She is alert and oriented to person, place, and time.  Psychiatric:        Mood and Affect: Mood and affect normal.        Behavior: Behavior normal.      Musculoskeletal Exam: Spine thoracic and lumbar spine with good range of motion.  Shoulder joints, elbow joints, wrist joints, MCPs PIPs and  DIPs with good range of motion with no synovitis.  Hip joints, knee joints, ankles, MTPs and PIPs with good range of motion with no synovitis.  CDAI Exam: CDAI Score: -- Patient Global: --; Provider Global: -- Swollen: --; Tender: -- Joint Exam 12/03/2020   No joint exam has been documented for this visit   There is currently no information documented on the homunculus. Go to the Rheumatology activity and complete the homunculus joint exam.  Investigation: No additional findings.  Imaging: No results found.  Recent Labs: Lab Results  Component Value Date   WBC 10.2 01/17/2020   HGB 12.6 01/17/2020   PLT 270 01/17/2020   NA 136 07/30/2020   K 4.5 07/30/2020   CL 102 07/30/2020   CO2 25 07/30/2020   GLUCOSE 84 07/30/2020   BUN 13 07/30/2020   CREATININE 0.62 07/30/2020   BILITOT 0.6 07/30/2020   ALKPHOS 46 01/17/2020   AST 16 07/30/2020   ALT 12 07/30/2020   PROT 7.5 07/30/2020   ALBUMIN 3.3 (L) 01/17/2020   CALCIUM 9.9 07/30/2020   GFRAA 135 07/30/2020    Speciality Comments: No specialty comments available.  Procedures:  No  procedures performed Allergies: Keflex [cephalexin]   Assessment / Plan:     Visit Diagnoses: Vitiligo -patient has developed progressive vitiligo over the last 2 years.  The vitiligo involves her perineal region and her feet.  I will refer her to dermatology.  Plan: Ambulatory referral to Dermatology  Alopecia areata-she had a patch of alopecia areata 2 years ago which resolved after using topical steroid lotion.  Rash -she brought pictures on her cell phone of erythematous rash most likely eczema in axillary region, inguinal region and under her breast.  The rash is resolved now.  Plan: CBC with Differential/Platelet, CK, Sedimentation rate, ANA  Keratosis pilaris-she has keratosis pilaris on her extremities.  Topical agents over-the-counter were discussed.  Essential hypertension, benign-blood pressure is normal today.  Perennial and seasonal allergic rhinitis  Gastroesophageal reflux disease without esophagitis  Fatty liver disease, nonalcoholic-she is trying weight loss.  History of anxiety  Insulin resistance-patient states she was diagnosed with insulin resistance by her GYN.  She is on Saxenda now.  Other fatigue - Plan: CBC with Differential/Platelet, CK  Orders: Orders Placed This Encounter  Procedures  . CBC with Differential/Platelet  . CK  . Sedimentation rate  . ANA  . Ambulatory referral to Dermatology   No orders of the defined types were placed in this encounter.    Follow-Up Instructions: Return in about 3 months (around 03/03/2021) for Vitiligo, alopecia areata.   Bo Merino, MD  Note - This record has been created using Editor, commissioning.  Chart creation errors have been sought, but may not always  have been located. Such creation errors do not reflect on  the standard of medical care.

## 2020-11-28 ENCOUNTER — Telehealth: Payer: Self-pay

## 2020-11-28 ENCOUNTER — Encounter: Payer: Self-pay | Admitting: Physician Assistant

## 2020-11-28 NOTE — Telephone Encounter (Signed)
Prior authorization for Saxenda submitted to patient's insurance. Pending determination.

## 2020-11-29 ENCOUNTER — Other Ambulatory Visit (HOSPITAL_COMMUNITY): Payer: Self-pay | Admitting: Obstetrics and Gynecology

## 2020-11-29 MED FILL — FLUCONAZOLE 150 MG TABS: 150 | 3 days supply | Qty: 2 | Fill #0

## 2020-11-29 MED FILL — NYSTATIN-TRIAMCINOLONE OINT: 100000-0.1 | 15 days supply | Qty: 30 | Fill #0

## 2020-12-03 ENCOUNTER — Encounter: Payer: Self-pay | Admitting: Rheumatology

## 2020-12-03 ENCOUNTER — Other Ambulatory Visit: Payer: Self-pay

## 2020-12-03 ENCOUNTER — Ambulatory Visit: Payer: No Typology Code available for payment source | Admitting: Rheumatology

## 2020-12-03 VITALS — BP 119/77 | HR 62 | Resp 16 | Ht 66.0 in | Wt 217.8 lb

## 2020-12-03 DIAGNOSIS — L858 Other specified epidermal thickening: Secondary | ICD-10-CM

## 2020-12-03 DIAGNOSIS — L639 Alopecia areata, unspecified: Secondary | ICD-10-CM

## 2020-12-03 DIAGNOSIS — Z8659 Personal history of other mental and behavioral disorders: Secondary | ICD-10-CM

## 2020-12-03 DIAGNOSIS — R5383 Other fatigue: Secondary | ICD-10-CM

## 2020-12-03 DIAGNOSIS — K760 Fatty (change of) liver, not elsewhere classified: Secondary | ICD-10-CM

## 2020-12-03 DIAGNOSIS — K219 Gastro-esophageal reflux disease without esophagitis: Secondary | ICD-10-CM

## 2020-12-03 DIAGNOSIS — L80 Vitiligo: Secondary | ICD-10-CM | POA: Diagnosis not present

## 2020-12-03 DIAGNOSIS — J3089 Other allergic rhinitis: Secondary | ICD-10-CM

## 2020-12-03 DIAGNOSIS — R21 Rash and other nonspecific skin eruption: Secondary | ICD-10-CM

## 2020-12-03 DIAGNOSIS — I10 Essential (primary) hypertension: Secondary | ICD-10-CM

## 2020-12-03 DIAGNOSIS — E8881 Metabolic syndrome: Secondary | ICD-10-CM

## 2020-12-04 ENCOUNTER — Other Ambulatory Visit: Payer: Self-pay | Admitting: *Deleted

## 2020-12-04 MED FILL — NYSTATIN-TRIAMCINOLONE OINT: 100000-0.1 | 15 days supply | Qty: 30 | Fill #0

## 2020-12-04 MED FILL — FLUCONAZOLE 150 MG TABS: 150 | 3 days supply | Qty: 2 | Fill #0 | Status: TO

## 2020-12-04 MED FILL — NYSTATIN-TRIAMCINOLONE OINT: 100000-0.1 | 15 days supply | Qty: 30 | Fill #0 | Status: TO

## 2020-12-04 MED FILL — FLUCONAZOLE 150 MG TABS: 150 | 3 days supply | Qty: 2 | Fill #0

## 2020-12-04 NOTE — Progress Notes (Signed)
Dr. Estanislado Pandy would like the patient to come in today or sometime this week to update lab work.

## 2020-12-04 NOTE — Telephone Encounter (Signed)
Saxenda approved through [place] from 11/28/20-11/27/21. Pharmacy notified.

## 2020-12-05 ENCOUNTER — Telehealth: Payer: Self-pay | Admitting: *Deleted

## 2020-12-05 ENCOUNTER — Other Ambulatory Visit: Payer: Self-pay

## 2020-12-05 DIAGNOSIS — R748 Abnormal levels of other serum enzymes: Secondary | ICD-10-CM

## 2020-12-05 LAB — CBC WITH DIFFERENTIAL/PLATELET
Absolute Monocytes: 546 cells/uL (ref 200–950)
Basophils Absolute: 53 cells/uL (ref 0–200)
Basophils Relative: 0.5 %
Eosinophils Absolute: 263 cells/uL (ref 15–500)
Eosinophils Relative: 2.5 %
HCT: 39.5 % (ref 35.0–45.0)
Hemoglobin: 13.1 g/dL (ref 11.7–15.5)
Lymphs Abs: 3014 cells/uL (ref 850–3900)
MCH: 30.4 pg (ref 27.0–33.0)
MCHC: 33.2 g/dL (ref 32.0–36.0)
MCV: 91.6 fL (ref 80.0–100.0)
MPV: 10.1 fL (ref 7.5–12.5)
Monocytes Relative: 5.2 %
Neutro Abs: 6626 cells/uL (ref 1500–7800)
Neutrophils Relative %: 63.1 %
Platelets: 295 10*3/uL (ref 140–400)
RBC: 4.31 10*6/uL (ref 3.80–5.10)
RDW: 12.4 % (ref 11.0–15.0)
Total Lymphocyte: 28.7 %
WBC: 10.5 10*3/uL (ref 3.8–10.8)

## 2020-12-05 LAB — ANA: Anti Nuclear Antibody (ANA): NEGATIVE

## 2020-12-05 LAB — SEDIMENTATION RATE: Sed Rate: 9 mm/h (ref 0–20)

## 2020-12-05 LAB — CK: Total CK: 2057 U/L — ABNORMAL HIGH (ref 29–143)

## 2020-12-05 NOTE — Telephone Encounter (Signed)
-----   Message from Bo Merino, MD sent at 12/05/2020  1:10 PM EST ----- I spoke with patient today.  She states she has not worked out since Saturday.  She will come this afternoon to get repeat CK.  Increase in fluid intake was discussed.

## 2020-12-05 NOTE — Progress Notes (Signed)
I spoke with patient today.  She states she has not worked out since Saturday.  She will come this afternoon to get repeat CK.  Increase in fluid intake was discussed.

## 2020-12-06 ENCOUNTER — Telehealth: Payer: Self-pay | Admitting: *Deleted

## 2020-12-06 DIAGNOSIS — R748 Abnormal levels of other serum enzymes: Secondary | ICD-10-CM

## 2020-12-06 LAB — CK: Total CK: 714 U/L — ABNORMAL HIGH (ref 29–143)

## 2020-12-06 NOTE — Telephone Encounter (Signed)
-----   Message from Bo Merino, MD sent at 12/06/2020  2:30 PM EST ----- CK is much better.  Plan repeat CK in 2 weeks.  Please advise patient not to do any vigorous exercise and drink a lot of fluids prior to her labs.  Please place an order for CK, aldolase, myositis assessment panel and UA in 2 weeks.

## 2020-12-06 NOTE — Progress Notes (Signed)
CK is much better.  Plan repeat CK in 2 weeks.  Please advise patient not to do any vigorous exercise and drink a lot of fluids prior to her labs.  Please place an order for CK, aldolase, myositis assessment panel and UA in 2 weeks.

## 2020-12-07 ENCOUNTER — Other Ambulatory Visit: Payer: Self-pay | Admitting: Rheumatology

## 2020-12-07 DIAGNOSIS — R748 Abnormal levels of other serum enzymes: Secondary | ICD-10-CM

## 2020-12-07 NOTE — Progress Notes (Signed)
I had a detailed discussion with the patient.  She had myoglobin in her urine in 2017.  She was placed on red yeast rice in October 2021 for elevated LDL.  Which is basically Mevacor.  I have advised her to stop red yeast rice.  I have also asked her to increase water intake and come for lab work in 1 week.  Use of coenzyme Q10 was also discussed.

## 2020-12-11 ENCOUNTER — Encounter: Payer: Self-pay | Admitting: Radiology

## 2020-12-14 IMAGING — DX DG CHEST 1V PORT
2 series · 2 of 2 positions shown · non-contrast
Comparison: 06/18/2012

CLINICAL DATA: Chest pain radiating to the back for 3 days.

EXAM:
PORTABLE CHEST 1 VIEW

[chest ap (1 of 2)]
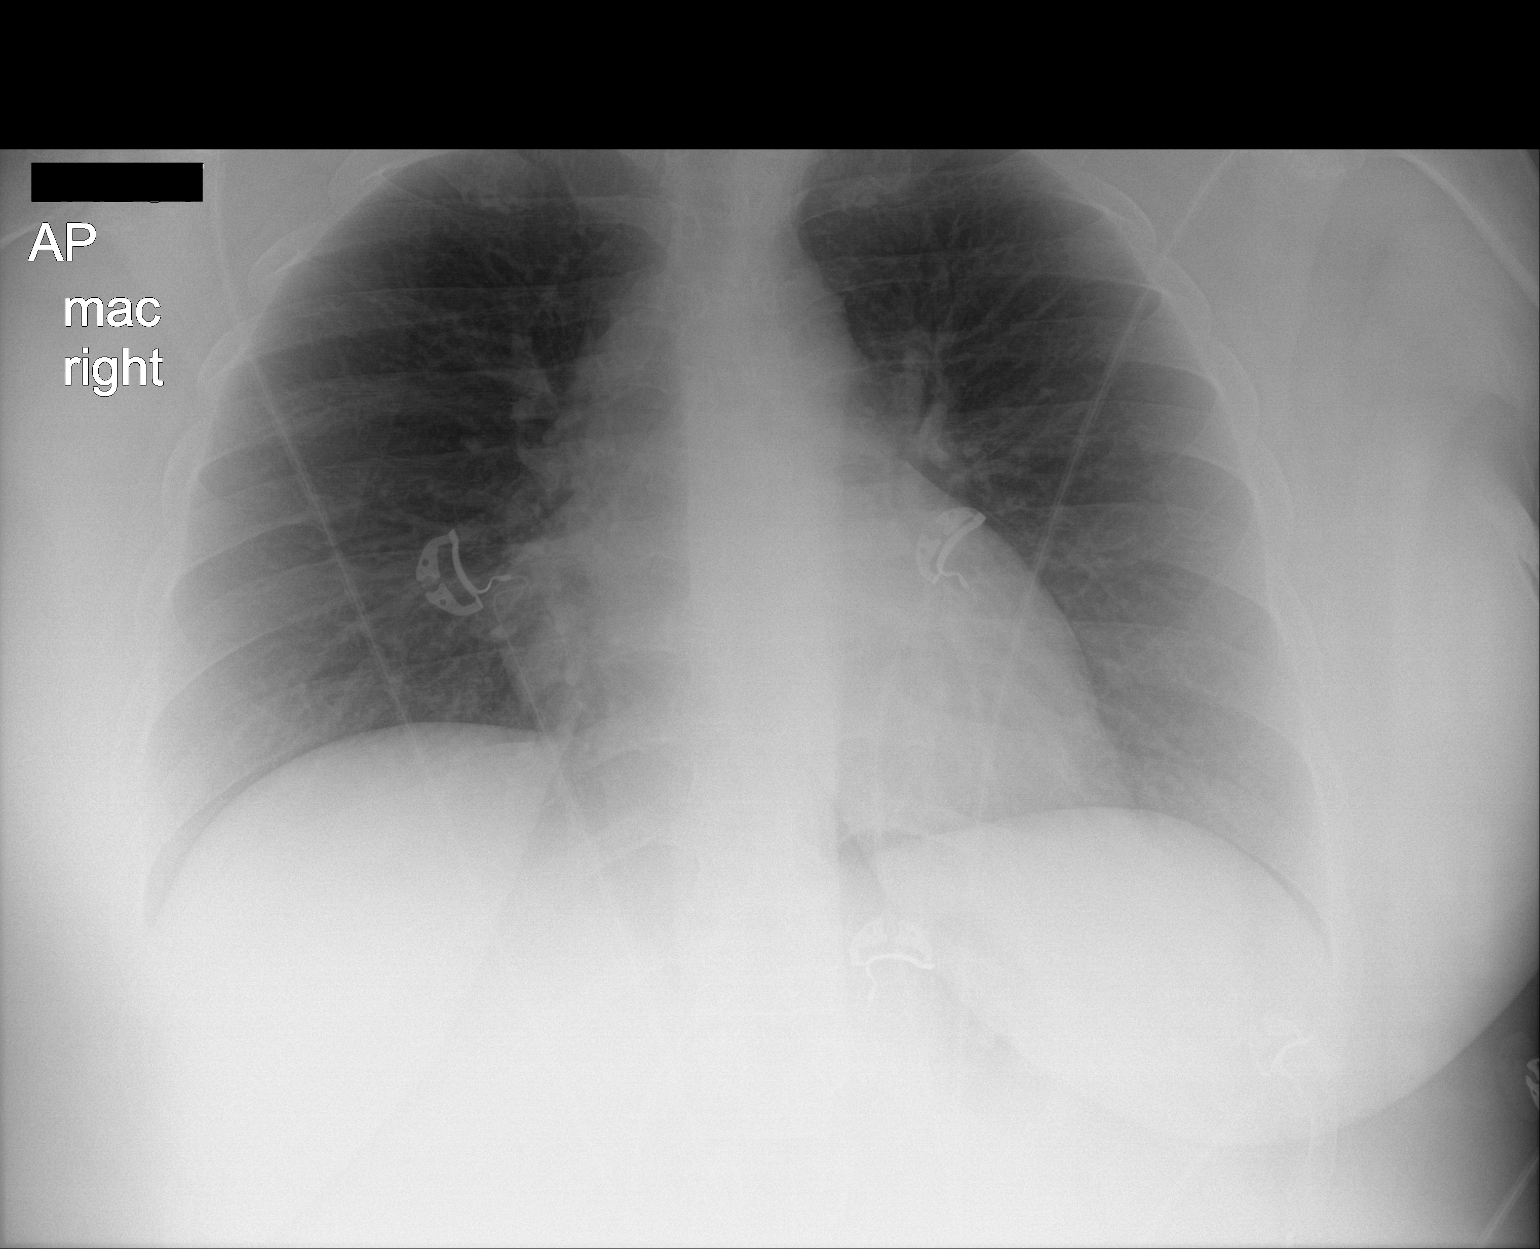

[chest ap (2 of 2)]
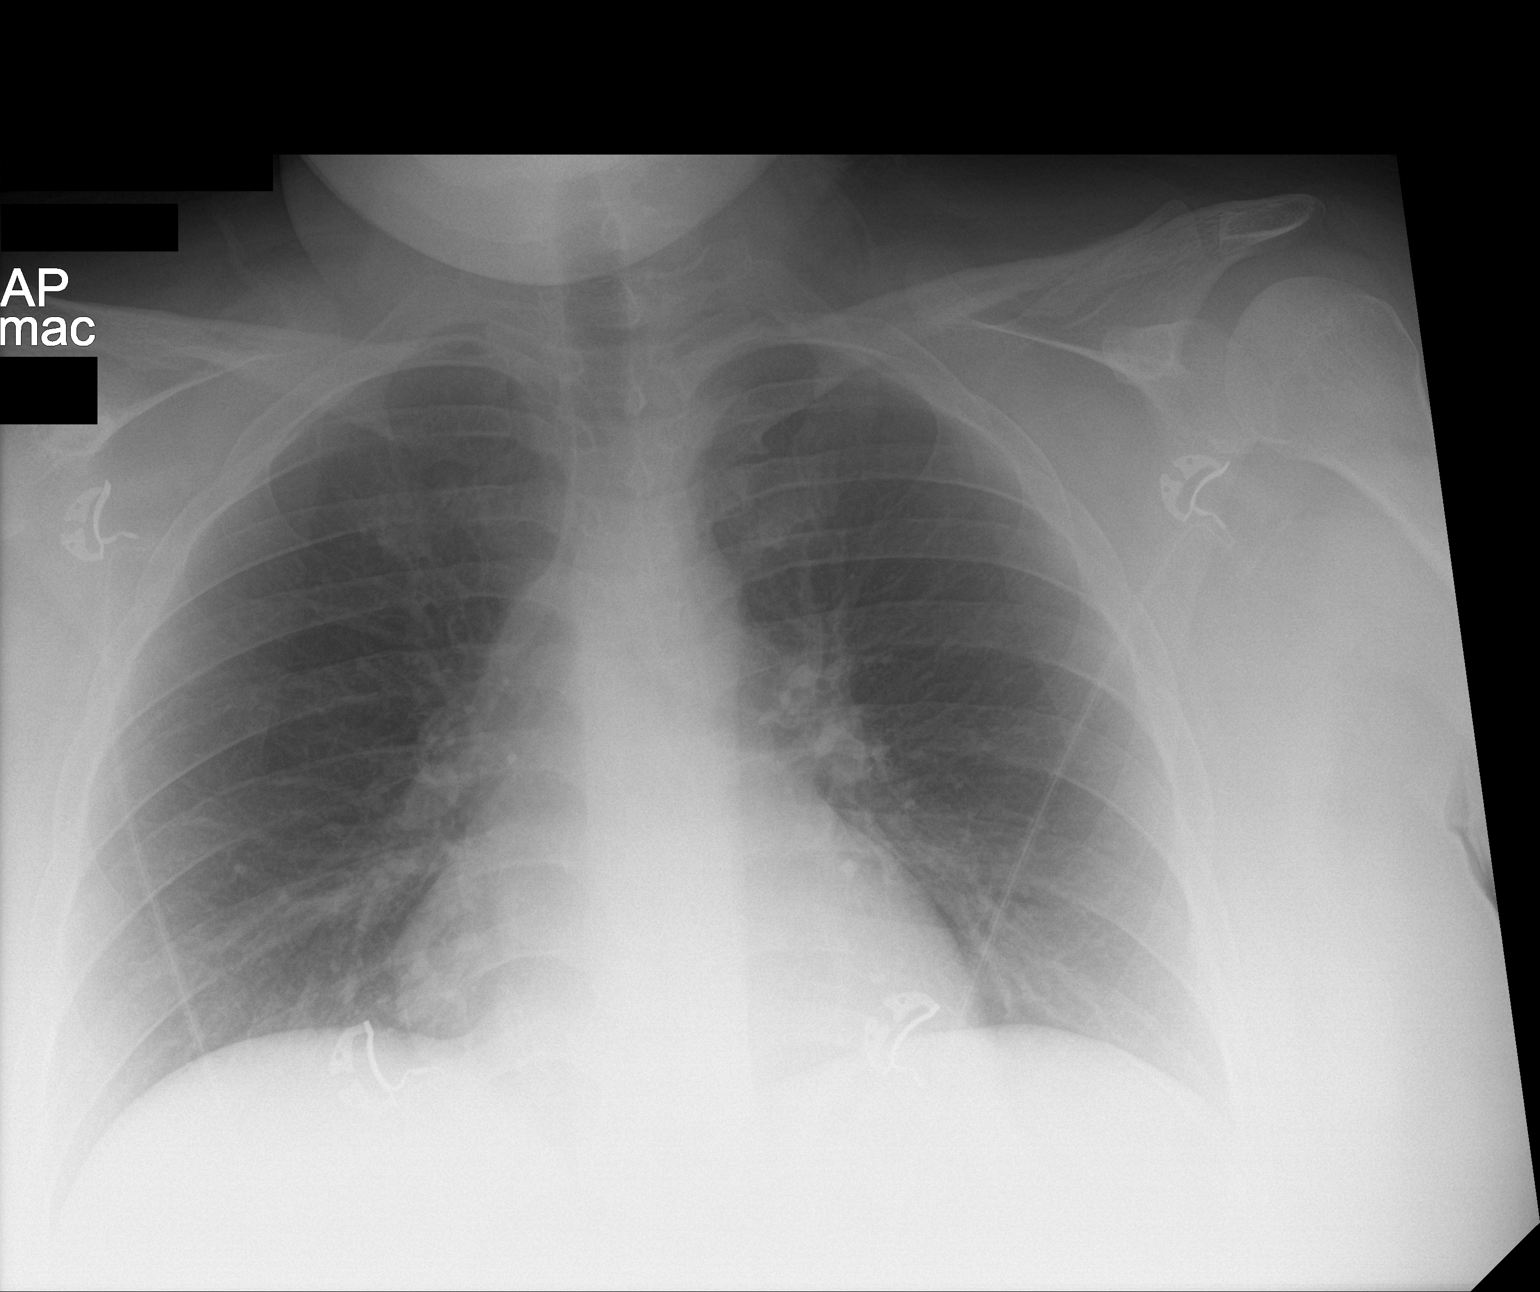

[2 of 2 positions shown; findings below may reference images not displayed]

FINDINGS: Normal heart size and mediastinal contours. No acute infiltrate or
edema. No effusion or pneumothorax. No acute osseous findings.
IMPRESSION: Negative chest.

## 2020-12-16 ENCOUNTER — Other Ambulatory Visit (HOSPITAL_COMMUNITY): Payer: Self-pay | Admitting: Physician Assistant

## 2020-12-16 ENCOUNTER — Other Ambulatory Visit: Payer: Self-pay | Admitting: Physician Assistant

## 2020-12-16 DIAGNOSIS — F439 Reaction to severe stress, unspecified: Secondary | ICD-10-CM

## 2020-12-16 DIAGNOSIS — F419 Anxiety disorder, unspecified: Secondary | ICD-10-CM

## 2020-12-16 MED FILL — CITALOPRAM HBR 10 MG TABLET: 10 | 30 days supply | Qty: 30 | Fill #0

## 2020-12-23 ENCOUNTER — Other Ambulatory Visit: Payer: Self-pay | Admitting: *Deleted

## 2020-12-23 DIAGNOSIS — R748 Abnormal levels of other serum enzymes: Secondary | ICD-10-CM

## 2020-12-24 NOTE — Progress Notes (Signed)
CK is almost back to normal.  UA is negative for myoglobin.  CMP, uric acid, phosphorus and LDH are normal.  Some of the results are still pending.

## 2020-12-25 NOTE — Progress Notes (Deleted)
Office Visit Note  Patient: Kayla Campos             Date of Birth: 1986/06/29           MRN: [id number]             PCP: Lavada Mesi Referring: Lavada Mesi Visit Date: 12/31/2020 Occupation: @GUAROCC @  Subjective:  No chief complaint on file.   History of Present Illness: Kyle Stansell is a 35 y.o. female ***   Activities of Daily Living:  Patient reports morning stiffness for *** {minute/hour:19697}.   Patient {ACTIONS;DENIES/REPORTS:21021675::"Denies"} nocturnal pain.  Difficulty dressing/grooming: {ACTIONS;DENIES/REPORTS:21021675::"Denies"} Difficulty climbing stairs: {ACTIONS;DENIES/REPORTS:21021675::"Denies"} Difficulty getting out of chair: {ACTIONS;DENIES/REPORTS:21021675::"Denies"} Difficulty using hands for taps, buttons, cutlery, and/or writing: {ACTIONS;DENIES/REPORTS:21021675::"Denies"}  No Rheumatology ROS completed.   PMFS History:  Patient Active Problem List   Diagnosis Date Noted  . Anxiety 10/18/2020  . Elevated LDL cholesterol level 07/30/2020  . Vitiligo 07/30/2020  . Allergic conjunctivitis 01/03/2020  . Mild persistent asthma 01/03/2020  . Food allergy 01/03/2020  . Binge-eating disorder, mild 12/12/2019  . Perennial and seasonal allergic rhinitis 12/12/2019  . Wheezing 12/12/2019  . Hematuria 02/26/2016  . Stress 10/29/2015  . Abnormal weight gain 10/29/2015  . Hair loss 10/29/2015  . Class 2 obesity due to excess calories without serious comorbidity with body mass index (BMI) of 35.0 to 35.9 in adult 10/29/2015  . BMI 36.0-36.9,adult 12/11/2014  . Hyperpigmentation of skin of cheek 12/11/2014  . Fatty liver disease, nonalcoholic 05/39/7673  . Bloating 05/25/2014  . Gastroesophageal reflux disease without esophagitis 05/25/2014  . Nausea alone 05/25/2014  . Essential hypertension, benign 05/25/2014  . Keratosis pilaris 10/11/2013    Past Medical History:  Diagnosis Date  . Alopecia    per  patient   . Asthma   . Eczema   . Hypertension   . Insulin resistance    per patient   . Pleurisy   . PNA (pneumonia)   . Urticaria   . Vitiligo    per patient, dx by derm    Family History  Problem Relation Age of Onset  . Hypertension Mother   . Hypothyroidism Mother   . Diabetes Father   . Hypertension Father   . Coronary artery disease Father   . Hyperlipidemia Father   . Congestive Heart Failure Father   . Hypertension Sister   . Allergic rhinitis Sister   . Eczema Sister   . Urticaria Sister   . IgA nephropathy Sister   . Diabetes Sister   . Hyperlipidemia Maternal Aunt   . Heart attack Maternal Grandmother   . Heart attack Maternal Grandfather   . Diabetes Maternal Grandfather   . Stroke Maternal Grandfather   . Cancer Paternal Grandmother   . Heart attack Paternal Grandmother   . Diabetes Paternal Grandmother   . Healthy Brother   . Healthy Sister   . Asthma Neg Hx   . Angioedema Neg Hx    Past Surgical History:  Procedure Laterality Date  . [place] EXTRACTION  2008   Social History   Social History Narrative  . Not on file   Immunization History  Administered Date(s) Administered  . PFIZER(Purple Top)SARS-COV-2 Vaccination 11/08/2019, 11/29/2019, 08/23/2020     Objective: Vital Signs: There were no vitals taken for this visit.   Physical Exam   Musculoskeletal Exam: ***  CDAI Exam: CDAI Score: - Patient Global: -; Provider Global: - Swollen: -; Tender: - Joint Exam 12/31/2020  No joint exam has been documented for this visit   There is currently no information documented on the homunculus. Go to the Rheumatology activity and complete the homunculus joint exam.  Investigation: No additional findings.  Imaging: No results found.  Recent Labs: Lab Results  Component Value Date   WBC 10.5 12/03/2020   HGB 13.1 12/03/2020   PLT 295 12/03/2020   NA 141 12/23/2020   K 4.2 12/23/2020   CL 105 12/23/2020   CO2 28 12/23/2020    GLUCOSE 91 12/23/2020   BUN 14 12/23/2020   CREATININE 0.60 12/23/2020   BILITOT 0.2 12/23/2020   ALKPHOS 46 01/17/2020   AST 14 12/23/2020   ALT 11 12/23/2020   PROT 6.5 12/23/2020   ALBUMIN 3.3 (L) 01/17/2020   CALCIUM 9.5 12/23/2020   GFRAA 137 12/23/2020    Speciality Comments: No specialty comments available.  Procedures:  No procedures performed Allergies: Keflex [cephalexin]   Assessment / Plan:     Visit Diagnoses: No diagnosis found.  Orders: No orders of the defined types were placed in this encounter.  No orders of the defined types were placed in this encounter.   Face-to-face time spent with patient was *** minutes. Greater than 50% of time was spent in counseling and coordination of care.  Follow-Up Instructions: No follow-ups on file.   Bo Merino, MD  Note - This record has been created using Editor, commissioning.  Chart creation errors have been sought, but may not always  have been located. Such creation errors do not reflect on  the standard of medical care.

## 2020-12-31 ENCOUNTER — Ambulatory Visit: Payer: No Typology Code available for payment source | Admitting: Rheumatology

## 2021-01-02 LAB — COMPLETE METABOLIC PANEL WITH GFR
AG Ratio: 1.3 (calc) (ref 1.0–2.5)
ALT: 11 U/L (ref 6–29)
AST: 14 U/L (ref 10–30)
Albumin: 3.7 g/dL (ref 3.6–5.1)
Alkaline phosphatase (APISO): 39 U/L (ref 31–125)
BUN: 14 mg/dL (ref 7–25)
CO2: 28 mmol/L (ref 20–32)
Calcium: 9.5 mg/dL (ref 8.6–10.2)
Chloride: 105 mmol/L (ref 98–110)
Creat: 0.6 mg/dL (ref 0.50–1.10)
GFR, Est African American: 137 mL/min/{1.73_m2} (ref >=60)
GFR, Est Non African American: 118 mL/min/{1.73_m2} (ref >=60)
Globulin: 2.8 g/dL (calc) (ref 1.9–3.7)
Glucose, Bld: 91 mg/dL (ref 65–99)
Potassium: 4.2 mmol/L (ref 3.5–5.3)
Sodium: 141 mmol/L (ref 135–146)
Total Bilirubin: 0.2 mg/dL (ref 0.2–1.2)
Total Protein: 6.5 g/dL (ref 6.1–8.1)

## 2021-01-02 LAB — CK: Total CK: 152 U/L — ABNORMAL HIGH (ref 29–143)

## 2021-01-02 LAB — URINALYSIS, ROUTINE W REFLEX MICROSCOPIC
Bilirubin Urine: NEGATIVE
Glucose, UA: NEGATIVE
Hgb urine dipstick: NEGATIVE
Ketones, ur: NEGATIVE
Leukocytes,Ua: NEGATIVE
Nitrite: NEGATIVE
Protein, ur: NEGATIVE
Specific Gravity, Urine: 1.021 (ref 1.001–1.03)
pH: 6.5 (ref 5.0–8.0)

## 2021-01-02 LAB — MYOSITIS ASSESSR PLUS JO-1 AUTOABS
EJ Autoabs: NOT DETECTED
Jo-1 Autoabs: <1 AI
Ku Autoabs: NOT DETECTED
Mi-2 Autoabs: NOT DETECTED
OJ Autoabs: NOT DETECTED
PL-12 Autoabs: NOT DETECTED
PL-7 Autoabs: NOT DETECTED
SRP Autoabs: NOT DETECTED

## 2021-01-02 LAB — HMGCR AB (IGG): HMGCR AB (IGG): <2 CU (ref <20)

## 2021-01-02 LAB — LACTATE DEHYDROGENASE: LDH: 114 U/L (ref 100–200)

## 2021-01-02 LAB — ALDOLASE: Aldolase: 3.5 U/L (ref <=8.1)

## 2021-01-02 LAB — URIC ACID: Uric Acid, Serum: 5.2 mg/dL (ref 2.5–7.0)

## 2021-01-02 LAB — PHOSPHORUS: Phosphorus: 3.2 mg/dL (ref 2.5–4.5)

## 2021-01-03 NOTE — Progress Notes (Signed)
[facility] CR antibody negative, myositis panel negative, and aldolase are normal.

## 2021-01-06 ENCOUNTER — Encounter: Payer: Self-pay | Admitting: Radiology

## 2021-01-14 ENCOUNTER — Other Ambulatory Visit: Payer: Self-pay | Admitting: Physician Assistant

## 2021-01-14 DIAGNOSIS — F419 Anxiety disorder, unspecified: Secondary | ICD-10-CM

## 2021-01-14 DIAGNOSIS — F439 Reaction to severe stress, unspecified: Secondary | ICD-10-CM

## 2021-01-14 MED FILL — LEVOCETIRIZINE 5 MG TABLET: 5 | 90 days supply | Qty: 90 | Fill #1

## 2021-01-17 ENCOUNTER — Other Ambulatory Visit: Payer: Self-pay | Admitting: Physician Assistant

## 2021-01-17 ENCOUNTER — Other Ambulatory Visit (HOSPITAL_COMMUNITY): Payer: Self-pay | Admitting: Physician Assistant

## 2021-01-17 DIAGNOSIS — J3089 Other allergic rhinitis: Secondary | ICD-10-CM

## 2021-01-17 MED FILL — ESCITALOPRAM 10 MG TABLET: 10 | 90 days supply | Qty: 90 | Fill #0

## 2021-01-17 MED FILL — LARISSIA 0.1-20 MG-MCG TABS: 0.1-20 | 84 days supply | Qty: 84 | Fill #0

## 2021-01-17 MED FILL — MONTELUKAST SOD 10 MG TAB: 10 | 90 days supply | Qty: 90 | Fill #0

## 2021-01-27 ENCOUNTER — Ambulatory Visit: Payer: No Typology Code available for payment source | Admitting: Physician Assistant

## 2021-02-25 ENCOUNTER — Encounter: Payer: Self-pay | Admitting: Family Medicine

## 2021-02-25 ENCOUNTER — Telehealth (INDEPENDENT_AMBULATORY_CARE_PROVIDER_SITE_OTHER): Payer: No Typology Code available for payment source | Admitting: Family Medicine

## 2021-02-25 DIAGNOSIS — R062 Wheezing: Secondary | ICD-10-CM

## 2021-02-25 DIAGNOSIS — J069 Acute upper respiratory infection, unspecified: Secondary | ICD-10-CM

## 2021-02-25 DIAGNOSIS — J4521 Mild intermittent asthma with (acute) exacerbation: Secondary | ICD-10-CM

## 2021-02-25 MED ORDER — GUAIFENESIN ER 600 MG PO TB12: 1200.0000 mg | ORAL_TABLET | Freq: Two times a day (BID) | ORAL | 2 refills | Status: DC

## 2021-02-25 MED ORDER — HYDROCOD POLST-CPM POLST ER 10-8 MG/5ML PO SUER
5.0000 mL | Freq: Two times a day (BID) | ORAL | 0 refills | Status: DC | PRN
Start: 2021-02-25 — End: 2021-07-31

## 2021-02-25 MED ORDER — FLOVENT HFA 44 MCG/ACT IN AERO: 2.0000 | INHALATION_SPRAY | Freq: Two times a day (BID) | RESPIRATORY_TRACT | 12 refills | Status: DC

## 2021-02-25 MED ORDER — ALBUTEROL SULFATE HFA 108 (90 BASE) MCG/ACT IN AERS
2.0000 | INHALATION_SPRAY | Freq: Four times a day (QID) | RESPIRATORY_TRACT | 1 refills | Status: DC | PRN
Start: 2021-02-25 — End: 2021-08-07

## 2021-02-25 MED ORDER — PREDNISONE 20 MG PO TABS: 40.0000 mg | ORAL_TABLET | Freq: Every day | ORAL | 0 refills | Status: AC

## 2021-02-25 NOTE — Progress Notes (Signed)
Virtual Video Visit via MyChart Note  I connected with  Kayla Campos on 02/25/21 at 10:10 AM EDT by the video enabled telemedicine application for MyChart, and verified that I am speaking with the correct person using two identifiers.   I introduced myself as a Designer, jewellery with the practice. We discussed the limitations of evaluation and management by telemedicine and the availability of in person appointments. The patient expressed understanding and agreed to proceed.  Participating parties in this visit include: The patient and the nurse practitioner listed.  The patient is: At home I am: In the office - [facility]  Subjective:    CC:  Chief Complaint  Patient presents with  . Cough  . URI    HPI: Kayla Campos is a 35 y.o. year old female presenting today via [place] today for cough.  Patient stating she started to feel bad yesterday, it originally felt like asthma exacerbation with coughing however her albuterol inhaler did not seem to help much.  She reports she did have some seasonal allergy symptoms over the weekend when she was working in the yard, but has no had any sneezing, itchy watery eyes, nasal congestion, or rhinorrhea the past 2 days.  She states she is feeling fatigued and rough overall, "I feel like a bag of turds. "This morning she had a fever of 100.4, frequent productive cough.  She has not visualized any sputum, but states it tastes weird.  She is not having any chest pain, but does describe some chest tightness with breathing and minor wheezing -states it feels like an asthma exacerbation.   She had a negative home COVID test this morning.  Reports she is fully vaccinated against flu and COVID and has already had COVID twice.  Reports history of pneumonia and pleurisy.  So far she has tried Alka-Seltzer cold and flu nighttime and Vicks vapor rub last night.  She has had to use her albuterol inhaler twice a day for the past 2  days, however, she states she does not feel like it helped much.  When I asked her to check the expiration date today it said 2020.  She denies any chest pain, GI symptoms, loss of taste or smell, headache, sinus pressure, ear pain, lightheadedness, appetite changes.  Her boyfriend recently had upper respiratory symptoms as well, though his symptoms included more nasal congestion and headache. He has taken albuterol and prednisone.  He seems to be feeling a little bit better but cough is lingering.    Past medical history, Surgical history, Family history not pertinant except as noted below, Social history, Allergies, and medications have been entered into the medical record, reviewed, and corrections made.   Review of Systems:  All review of systems negative except what is listed in the HPI   Objective:    General:  Speaking clearly in complete sentences. Absent shortness of breath noted.   Alert and oriented x3.   Normal judgment.  Absent acute distress.   Impression and Recommendations:    1. Wheezing 2. Viral upper respiratory tract infection 3. Mild intermittent asthma with exacerbation  Patient with symptoms consistent with viral upper respiratory infection and asthma exacerbation.  Given that she has only had symptoms for less than 48 hours and given her boyfriend with similar viral infection already improving, would like to start conservatively.  She can continue over-the-counter analgesics and cold medicines.  Recommend she take Mucinex, stay well-hydrated, rest, humidifier use, steam showers, warm compresses, salt  water gargles, honey.  Given her asthmatic symptoms, I would like to give her a prednisone burst and start Flovent.  We will also refill her albuterol since hers has expired.  Also giving a small amount of Tussionex for cough -we discussed pulmonary hygiene and significance of clearing out her lungs during the day and reserving the cough syrup for bedtime.  She is in  no acute distress today; no audible wheezing during visit.  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  Given her history of pneumonia with asthma, encouraged her to watch her symptoms closely for any signs of worsening infection.  If she is not improving by Friday or if she worsens before then, we can go ahead and begin antibiotics. Patient agreeable to plan.   - predniSONE (DELTASONE) 20 MG tablet; Take 2 tablets (40 mg total) by mouth daily with breakfast for 5 days.  Dispense: 10 tablet; Refill: 0 - guaiFENesin (MUCINEX) 600 MG 12 hr tablet; Take 2 tablets (1,200 mg total) by mouth 2 (two) times daily.  Dispense: 30 tablet; Refill: 2 - fluticasone (FLOVENT HFA) 44 MCG/ACT inhaler; Inhale 2 puffs into the lungs in the morning and at bedtime.  Dispense: 1 each; Refill: 12 - albuterol (VENTOLIN HFA) 108 (90 Base) MCG/ACT inhaler; Inhale 2 puffs into the lungs every 6 (six) hours as needed for up to 10 days for wheezing or shortness of breath.  Dispense: 18 g; Refill: 1 - chlorpheniramine-HYDROcodone (TUSSIONEX) 10-8 MG/5ML SUER; Take 5 mLs by mouth every 12 (twelve) hours as needed for cough (cough, will cause drowsiness.).  Dispense: 50 mL; Refill: 0   Follow-up if symptoms worsen or fail to improve.    I discussed the assessment and treatment plan with the patient. The patient was provided an opportunity to ask questions and all were answered. The patient agreed with the plan and demonstrated an understanding of the instructions.   The patient was advised to call back or seek an in-person evaluation if the symptoms worsen or if the condition fails to improve as anticipated.  I provided 20 minutes of non-face-to-face interaction with this [place] visit including intake, same-day documentation, and chart review.   Terrilyn Saver, NP

## 2021-02-26 ENCOUNTER — Other Ambulatory Visit (HOSPITAL_COMMUNITY): Payer: Self-pay

## 2021-02-26 ENCOUNTER — Telehealth: Payer: Self-pay

## 2021-02-26 ENCOUNTER — Ambulatory Visit (INDEPENDENT_AMBULATORY_CARE_PROVIDER_SITE_OTHER): Payer: No Typology Code available for payment source

## 2021-02-26 ENCOUNTER — Ambulatory Visit (INDEPENDENT_AMBULATORY_CARE_PROVIDER_SITE_OTHER): Payer: No Typology Code available for payment source | Admitting: Sports Medicine

## 2021-02-26 ENCOUNTER — Other Ambulatory Visit: Payer: Self-pay | Admitting: *Deleted

## 2021-02-26 ENCOUNTER — Other Ambulatory Visit: Payer: Self-pay

## 2021-02-26 ENCOUNTER — Encounter: Payer: Self-pay | Admitting: Sports Medicine

## 2021-02-26 VITALS — BP 105/68 | HR 93 | Temp 98.7°F

## 2021-02-26 DIAGNOSIS — J101 Influenza due to other identified influenza virus with other respiratory manifestations: Secondary | ICD-10-CM

## 2021-02-26 DIAGNOSIS — R509 Fever, unspecified: Secondary | ICD-10-CM

## 2021-02-26 DIAGNOSIS — R059 Cough, unspecified: Secondary | ICD-10-CM | POA: Diagnosis not present

## 2021-02-26 LAB — POCT INFLUENZA A/B
Influenza A, POC: POSITIVE — AB
Influenza B, POC: NEGATIVE

## 2021-02-26 MED ORDER — FLUCONAZOLE 150 MG PO TABS
150.0000 mg | ORAL_TABLET | Freq: Once | ORAL | 3 refills | Status: AC
  Filled 2021-02-26: qty 1, 1d supply, fill #0
  Filled 2021-06-03: qty 3, 3d supply, fill #1

## 2021-02-26 MED ORDER — AZITHROMYCIN 250 MG PO TABS: ORAL_TABLET | ORAL | 0 refills | Status: DC

## 2021-02-26 MED ORDER — AZITHROMYCIN 250 MG PO TABS
ORAL_TABLET | ORAL | 0 refills | Status: DC
Start: 2021-02-26 — End: 2021-02-26
  Filled 2021-02-26: qty 6, fill #0

## 2021-02-26 NOTE — Telephone Encounter (Signed)
Kayla Campos called and left a message stating she feels worse today. She had a virtual visit with Lovena Le and was advised to call back if worse for an antibiotic. She reports having history of pneumonia. Her temp is now 104.1. She is requesting an antibiotic. Please advise.

## 2021-02-26 NOTE — Progress Notes (Addendum)
    Procedures performed today:    Influenza A test was positive.  Independent interpretation of notes and tests performed by another provider:   None.  Brief History, Exam, Impression, and Recommendations:    Influenza A This is a pleasant 35 year old female with a history of asthma, she is fully vaccinated for influenza and COVID, unfortunately for the past 3 to 4 days she is had increasing fevers, chills, muscle aches, body aches, sore throat, coughing productive. She did a COVID test with health at work that was negative. No GI symptoms, no skin rash. She did a virtual visit, was started on prednisone, Tussionex. Fevers increased to over 104 F, coughing is worse so she presented for further evaluation. On exam she is speaking full sentences, she does have coarse sounds bilaterally. Oropharynx is unremarkable with the exception of chronically enlarged tonsils, nasopharynx shows boggy and erythematous turbinates, ears are unremarkable. We will swab her for COVID, influenza today. She is out of the window for Tamiflu, considering her history of asthma she will continue her prednisone and add azithromycin, she does have a new albuterol inhaler as well. We will get a chest x-ray today. Keep close follow-up within a week.    ___________________________________________ Gwen Her. Dianah Field, M.D., ABFM., CAQSM. Primary Care and [place] Instructor of [place] [facility]

## 2021-02-26 NOTE — Assessment & Plan Note (Signed)
This is a pleasant 35 year old female with a history of asthma, she is fully vaccinated for influenza and COVID, unfortunately for the past 3 to 4 days she is had increasing fevers, chills, muscle aches, body aches, sore throat, coughing productive. She did a COVID test with health at work that was negative. No GI symptoms, no skin rash. She did a virtual visit, was started on prednisone, Tussionex. Fevers increased to over 104 F, coughing is worse so she presented for further evaluation. On exam she is speaking full sentences, she does have coarse sounds bilaterally. Oropharynx is unremarkable with the exception of chronically enlarged tonsils, nasopharynx shows boggy and erythematous turbinates, ears are unremarkable. We will swab her for COVID, influenza today. She is out of the window for Tamiflu, considering her history of asthma she will continue her prednisone and add azithromycin, she does have a new albuterol inhaler as well. We will get a chest x-ray today. Keep close follow-up within a week.

## 2021-02-27 LAB — SARS-COV-2, NAA 2 DAY TAT

## 2021-02-27 LAB — NOVEL CORONAVIRUS, NAA: SARS-CoV-2, NAA: NOT DETECTED

## 2021-03-02 NOTE — Progress Notes (Deleted)
Office Visit Note  Patient: Kayla Campos             Date of Birth: 11/08/1986           MRN: [id number]             PCP: Lavada Mesi Referring: Lavada Mesi Visit Date: 03/06/2021 Occupation: @GUAROCC @  Subjective:  No chief complaint on file.   History of Present Illness: Kayla Campos is a 35 y.o. female ***   Activities of Daily Living:  Patient reports morning stiffness for *** {minute/hour:19697}.   Patient {ACTIONS;DENIES/REPORTS:21021675::"Denies"} nocturnal pain.  Difficulty dressing/grooming: {ACTIONS;DENIES/REPORTS:21021675::"Denies"} Difficulty climbing stairs: {ACTIONS;DENIES/REPORTS:21021675::"Denies"} Difficulty getting out of chair: {ACTIONS;DENIES/REPORTS:21021675::"Denies"} Difficulty using hands for taps, buttons, cutlery, and/or writing: {ACTIONS;DENIES/REPORTS:21021675::"Denies"}  No Rheumatology ROS completed.   PMFS History:  Patient Active Problem List   Diagnosis Date Noted  . Influenza A 02/26/2021  . Anxiety 10/18/2020  . Elevated LDL cholesterol level 07/30/2020  . Vitiligo 07/30/2020  . Allergic conjunctivitis 01/03/2020  . Mild persistent asthma 01/03/2020  . Food allergy 01/03/2020  . Binge-eating disorder, mild 12/12/2019  . Perennial and seasonal allergic rhinitis 12/12/2019  . Wheezing 12/12/2019  . Hematuria 02/26/2016  . Stress 10/29/2015  . Abnormal weight gain 10/29/2015  . Hair loss 10/29/2015  . Class 2 obesity due to excess calories without serious comorbidity with body mass index (BMI) of 35.0 to 35.9 in adult 10/29/2015  . BMI 36.0-36.9,adult 12/11/2014  . Hyperpigmentation of skin of cheek 12/11/2014  . Fatty liver disease, nonalcoholic 66/44/0347  . Bloating 05/25/2014  . Gastroesophageal reflux disease without esophagitis 05/25/2014  . Nausea alone 05/25/2014  . Essential hypertension, benign 05/25/2014  . Keratosis pilaris 10/11/2013    Past Medical History:  Diagnosis Date   . Alopecia    per patient   . Asthma   . Eczema   . Hypertension   . Insulin resistance    per patient   . Pleurisy   . PNA (pneumonia)   . Urticaria   . Vitiligo    per patient, dx by derm    Family History  Problem Relation Age of Onset  . Hypertension Mother   . Hypothyroidism Mother   . Diabetes Father   . Hypertension Father   . Coronary artery disease Father   . Hyperlipidemia Father   . Congestive Heart Failure Father   . Hypertension Sister   . Allergic rhinitis Sister   . Eczema Sister   . Urticaria Sister   . IgA nephropathy Sister   . Diabetes Sister   . Hyperlipidemia Maternal Aunt   . Heart attack Maternal Grandmother   . Heart attack Maternal Grandfather   . Diabetes Maternal Grandfather   . Stroke Maternal Grandfather   . Cancer Paternal Grandmother   . Heart attack Paternal Grandmother   . Diabetes Paternal Grandmother   . Healthy Brother   . Healthy Sister   . Asthma Neg Hx   . Angioedema Neg Hx    Past Surgical History:  Procedure Laterality Date  . [place] EXTRACTION  2008   Social History   Social History Narrative  . Not on file   Immunization History  Administered Date(s) Administered  . PFIZER(Purple Top)SARS-COV-2 Vaccination 11/08/2019, 11/29/2019, 08/23/2020     Objective: Vital Signs: LMP  (LMP Unknown)    Physical Exam   Musculoskeletal Exam: ***  CDAI Exam: CDAI Score: -- Patient Global: --; Provider Global: -- Swollen: --; Tender: -- Joint Exam  03/06/2021   No joint exam has been documented for this visit   There is currently no information documented on the homunculus. Go to the Rheumatology activity and complete the homunculus joint exam.  Investigation: No additional findings.  Imaging: DG Chest 2 View  Result Date: 02/27/2021 CLINICAL DATA:  35 year old female with history of fever, chills and cough. EXAM: CHEST - 2 VIEW COMPARISON:  Chest x-ray 01/17/2020. FINDINGS: Lung volumes are normal. No  consolidative airspace disease. No pleural effusions. No pneumothorax. No pulmonary nodule or mass noted. Pulmonary vasculature and the cardiomediastinal silhouette are within normal limits. IMPRESSION: No radiographic evidence of acute cardiopulmonary disease. Electronically Signed   By: [name] M.D.   On: 02/27/2021 10:36    Recent Labs: Lab Results  Component Value Date   WBC 10.5 12/03/2020   HGB 13.1 12/03/2020   PLT 295 12/03/2020   NA 141 12/23/2020   K 4.2 12/23/2020   CL 105 12/23/2020   CO2 28 12/23/2020   GLUCOSE 91 12/23/2020   BUN 14 12/23/2020   CREATININE 0.60 12/23/2020   BILITOT 0.2 12/23/2020   ALKPHOS 46 01/17/2020   AST 14 12/23/2020   ALT 11 12/23/2020   PROT 6.5 12/23/2020   ALBUMIN 3.3 (L) 01/17/2020   CALCIUM 9.5 12/23/2020   GFRAA 137 12/23/2020   December 23, 2020 UA negative, myositis panel negative, CK1 52, aldolase 3.5, uric acid 5.2, phosphorus normal, LDH normal, HMG CR antibody negative  Speciality Comments: No specialty comments available.  Procedures:  No procedures performed Allergies: Keflex [cephalexin]   Assessment / Plan:     Visit Diagnoses: No diagnosis found.  Orders: No orders of the defined types were placed in this encounter.  No orders of the defined types were placed in this encounter.   Face-to-face time spent with patient was *** minutes. Greater than 50% of time was spent in counseling and coordination of care.  Follow-Up Instructions: No follow-ups on file.   Bo Merino, MD  Note - This record has been created using Editor, commissioning.  Chart creation errors have been sought, but may not always  have been located. Such creation errors do not reflect on  the standard of medical care.

## 2021-03-06 ENCOUNTER — Ambulatory Visit: Payer: No Typology Code available for payment source | Admitting: Rheumatology

## 2021-03-06 DIAGNOSIS — J3089 Other allergic rhinitis: Secondary | ICD-10-CM

## 2021-03-06 DIAGNOSIS — K760 Fatty (change of) liver, not elsewhere classified: Secondary | ICD-10-CM

## 2021-03-06 DIAGNOSIS — R21 Rash and other nonspecific skin eruption: Secondary | ICD-10-CM

## 2021-03-06 DIAGNOSIS — K219 Gastro-esophageal reflux disease without esophagitis: Secondary | ICD-10-CM

## 2021-03-06 DIAGNOSIS — E8881 Metabolic syndrome: Secondary | ICD-10-CM

## 2021-03-06 DIAGNOSIS — I10 Essential (primary) hypertension: Secondary | ICD-10-CM

## 2021-03-06 DIAGNOSIS — Z8659 Personal history of other mental and behavioral disorders: Secondary | ICD-10-CM

## 2021-03-06 DIAGNOSIS — L80 Vitiligo: Secondary | ICD-10-CM

## 2021-03-06 DIAGNOSIS — L639 Alopecia areata, unspecified: Secondary | ICD-10-CM

## 2021-03-06 DIAGNOSIS — R748 Abnormal levels of other serum enzymes: Secondary | ICD-10-CM

## 2021-03-06 DIAGNOSIS — L858 Other specified epidermal thickening: Secondary | ICD-10-CM

## 2021-03-27 ENCOUNTER — Other Ambulatory Visit (HOSPITAL_COMMUNITY): Payer: Self-pay

## 2021-03-27 LAB — HM PAP SMEAR: HM Pap smear: NORMAL

## 2021-03-27 LAB — RESULTS CONSOLE HPV: CHL HPV: NEGATIVE

## 2021-03-27 MED ORDER — LEVONORGESTREL-ETHINYL ESTRAD 0.1-20 MG-MCG PO TABS
1.0000 | ORAL_TABLET | Freq: Every day | ORAL | 4 refills | Status: DC
  Filled 2021-03-27: qty 84, 84d supply, fill #0
  Filled 2021-05-30: qty 28, 28d supply, fill #1
  Filled 2021-07-17: qty 28, 28d supply, fill #2
  Filled 2021-07-18: qty 84, 84d supply, fill #2
  Filled 2021-09-19: qty 84, 84d supply, fill #3
  Filled 2021-12-22: qty 84, 84d supply, fill #4
  Filled 2022-03-17: qty 56, 56d supply, fill #5

## 2021-04-16 ENCOUNTER — Other Ambulatory Visit: Payer: Self-pay | Admitting: Physician Assistant

## 2021-04-16 ENCOUNTER — Other Ambulatory Visit (HOSPITAL_COMMUNITY): Payer: Self-pay

## 2021-04-16 MED ORDER — LEVOCETIRIZINE DIHYDROCHLORIDE 5 MG PO TABS
5.0000 mg | ORAL_TABLET | Freq: Every day | ORAL | 2 refills | Status: DC | PRN
  Filled 2021-04-16: qty 90, 90d supply, fill #0
  Filled 2021-09-19: qty 90, 90d supply, fill #1
  Filled 2021-12-22: qty 90, 90d supply, fill #2

## 2021-04-16 MED FILL — Escitalopram Oxalate Tab 10 MG (Base Equiv): ORAL | 90 days supply | Qty: 90 | Fill #0 | Status: AC

## 2021-05-13 ENCOUNTER — Other Ambulatory Visit: Payer: Self-pay | Admitting: Physician Assistant

## 2021-05-13 ENCOUNTER — Other Ambulatory Visit (HOSPITAL_COMMUNITY): Payer: Self-pay

## 2021-05-13 MED ORDER — MONTELUKAST SODIUM 10 MG PO TABS
10.0000 mg | ORAL_TABLET | Freq: Every day | ORAL | 0 refills | Status: DC
  Filled 2021-05-13: qty 90, 90d supply, fill #0

## 2021-05-30 ENCOUNTER — Other Ambulatory Visit (HOSPITAL_COMMUNITY): Payer: Self-pay

## 2021-06-03 ENCOUNTER — Other Ambulatory Visit (HOSPITAL_COMMUNITY): Payer: Self-pay

## 2021-07-17 ENCOUNTER — Other Ambulatory Visit (HOSPITAL_COMMUNITY): Payer: Self-pay

## 2021-07-18 ENCOUNTER — Other Ambulatory Visit (HOSPITAL_COMMUNITY): Payer: Self-pay

## 2021-07-31 ENCOUNTER — Other Ambulatory Visit (HOSPITAL_COMMUNITY): Payer: Self-pay

## 2021-07-31 ENCOUNTER — Telehealth: Payer: No Typology Code available for payment source | Admitting: Physician Assistant

## 2021-07-31 DIAGNOSIS — J019 Acute sinusitis, unspecified: Secondary | ICD-10-CM | POA: Diagnosis not present

## 2021-07-31 DIAGNOSIS — B9689 Other specified bacterial agents as the cause of diseases classified elsewhere: Secondary | ICD-10-CM

## 2021-07-31 MED ORDER — FLUCONAZOLE 150 MG PO TABS
150.0000 mg | ORAL_TABLET | Freq: Once | ORAL | 0 refills | Status: AC
  Filled 2021-07-31: qty 1, 1d supply, fill #0

## 2021-07-31 MED ORDER — DOXYCYCLINE HYCLATE 100 MG PO CAPS
100.0000 mg | ORAL_CAPSULE | Freq: Two times a day (BID) | ORAL | 0 refills | Status: DC
  Filled 2021-07-31: qty 20, 10d supply, fill #0

## 2021-07-31 NOTE — Progress Notes (Signed)
Virtual Visit Consent   Kayla Campos, you are scheduled for a virtual visit with a [facility] provider today.     Just as with appointments in the office, your consent must be obtained to participate.  Your consent will be active for this visit and any virtual visit you may have with one of our providers in the next 365 days.     If you have a MyChart account, a copy of this consent can be sent to you electronically.  All virtual visits are billed to your insurance company just like a traditional visit in the office.    As this is a virtual visit, video technology does not allow for your provider to perform a traditional examination.  This may limit your provider's ability to fully assess your condition.  If your provider identifies any concerns that need to be evaluated in person or the need to arrange testing (such as labs, EKG, etc.), we will make arrangements to do so.     Although advances in technology are sophisticated, we cannot ensure that it will always work on either your end or our end.  If the connection with a video visit is poor, the visit may have to be switched to a telephone visit.  With either a video or telephone visit, we are not always able to ensure that we have a secure connection.     I need to obtain your verbal consent now.   Are you willing to proceed with your visit today?    Dashawn Golda has provided verbal consent on 07/31/2021 for a virtual visit (video or telephone).   Leeanne Rio, [place]   Date: 07/31/2021 10:50 AM   Virtual Visit via Video Note   I, Leeanne Rio, connected with  Kayla Campos  ([id number], [birth year]) on 07/31/21 at 10:45 AM EDT by a video-enabled telemedicine application and verified that I am speaking with the correct person using two identifiers.  Location: Patient: Virtual Visit Location Patient: Home Provider: Virtual Visit Location Provider: Home Office   I discussed the limitations of  evaluation and management by telemedicine and the availability of in person appointments. The patient expressed understanding and agreed to proceed.    History of Present Illness: Kayla Campos is a 35 y.o. who identifies as a female who was assigned female at birth, and is being seen today for week+ of URI symptoms including sore throat, swollen glands, cough, congestion. Notes symptoms were initially improving but have worsened now. Took a COVID test (PCR) through [facility] and was negative. Denies true fever. Denies chest pain but has noted some windedness with significant exertion just since symptoms started. Endorses maxillary pressure and discomfort and frontal sinus pain. Continued sinus headache.   HPI: HPI  Problems:  Patient Active Problem List   Diagnosis Date Noted   Influenza A 02/26/2021   Anxiety 10/18/2020   Elevated LDL cholesterol level 07/30/2020   Vitiligo 07/30/2020   Allergic conjunctivitis 01/03/2020   Mild persistent asthma 01/03/2020   Food allergy 01/03/2020   Binge-eating disorder, mild 12/12/2019   Perennial and seasonal allergic rhinitis 12/12/2019   Wheezing 12/12/2019   Hematuria 02/26/2016   Stress 10/29/2015   Abnormal weight gain 10/29/2015   Hair loss 10/29/2015   Class 2 obesity due to excess calories without serious comorbidity with body mass index (BMI) of 35.0 to 35.9 in adult 10/29/2015   BMI 36.0-36.9,adult 12/11/2014   Hyperpigmentation of skin of cheek 12/11/2014   Fatty  liver disease, nonalcoholic 51/70/0174   Bloating 05/25/2014   Gastroesophageal reflux disease without esophagitis 05/25/2014   Nausea alone 05/25/2014   Essential hypertension, benign 05/25/2014   Keratosis pilaris 10/11/2013    Allergies:  Allergies  Allergen Reactions   Keflex [Cephalexin] Hives   Medications:  Current Outpatient Medications:    albuterol (VENTOLIN HFA) 108 (90 Base) MCG/ACT inhaler, Inhale 2 puffs into the lungs every 6 (six) hours as needed  for up to 10 days for wheezing or shortness of breath., Disp: 18 g, Rfl: 1   Ascorbic Acid (VITAMIN C PO), Take by mouth daily., Disp: , Rfl:    Biotin 10000 MCG TABS, Take by mouth., Disp: , Rfl:    cholecalciferol (VITAMIN D3) 25 MCG (1000 UNIT) tablet, Take 1,000 Units by mouth daily., Disp: , Rfl:    clonazePAM (KLONOPIN) 0.5 MG tablet, Take 1 tablet (0.5 mg total) by mouth 2 (two) times daily as needed for anxiety., Disp: 20 tablet, Rfl: 0   guaiFENesin (MUCINEX) 600 MG 12 hr tablet, Take 2 tablets (1,200 mg total) by mouth 2 (two) times daily., Disp: 30 tablet, Rfl: 2   levocetirizine (XYZAL) 5 MG tablet, Take 1 tablet (5 mg total) by mouth daily as needed for allergies., Disp: 90 tablet, Rfl: 3   levocetirizine (XYZAL) 5 MG tablet, Take 1 tablet (5 mg total) by mouth daily as needed for allergies, Disp: 90 tablet, Rfl: 2   levonorgestrel-ethinyl estradiol (ALESSE) 0.1-20 MG-MCG tablet, TAKE 1 TABLET EVERY DAY BY MOUTH, Disp: 84 tablet, Rfl: 1   levonorgestrel-ethinyl estradiol (ALESSE) 0.1-20 MG-MCG tablet, Take 1 tablet by mouth daily., Disp: 84 tablet, Rfl: 4   montelukast (SINGULAIR) 10 MG tablet, TAKE 1 TABLET (10 MG TOTAL) BY MOUTH AT BEDTIME., Disp: 90 tablet, Rfl: 0   montelukast (SINGULAIR) 10 MG tablet, Take 1 tablet (10 mg total) by mouth at bedtime., Disp: 90 tablet, Rfl: 0   Norgestimate-Ethinyl Estradiol Triphasic 0.18/0.215/0.25 MG-25 MCG tab, Take 1 tablet by mouth daily., Disp: , Rfl:    nystatin-triamcinolone ointment (MYCOLOG), APPLY TO THE AFFECTED AREA(S) 2 TIMES DAILY, Disp: 30 g, Rfl: 1   TECHLITE PEN NEEDLES 32G X 6 MM MISC, USE AS DIRECTED TO INJECT SAXENDA, Disp: 100 each, Rfl: 1   Zinc 100 MG TABS, Take by mouth., Disp: , Rfl:   Observations/Objective: Patient is well-developed, well-nourished in no acute distress.  Resting comfortably at home.  Head is normocephalic, atraumatic.  No labored breathing. Speech is clear and coherent with logical content.  Patient  is alert and oriented at baseline.   Assessment and Plan: 1. Acute bacterial sinusitis Double worsening of symptoms concerning for bacterial sinusitis. Negative COVID PCR. Rx Doxycycline.  Increase fluids.  Rest.  Saline nasal spray.  Probiotic.  Mucinex as directed.  Humidifier in bedroom. Tessalon per orders. Will place a Diflucan on file in case of antibiotic-associated yeast as she has history of this.  Call or return to clinic if symptoms are not improving.   Follow Up Instructions: I discussed the assessment and treatment plan with the patient. The patient was provided an opportunity to ask questions and all were answered. The patient agreed with the plan and demonstrated an understanding of the instructions.  A copy of instructions were sent to the patient via MyChart.  The patient was advised to call back or seek an in-person evaluation if the symptoms worsen or if the condition fails to improve as anticipated.  Time:  I spent 10 minutes with the  patient via telehealth technology discussing the above problems/concerns.    Leeanne Rio, PA-C

## 2021-07-31 NOTE — Patient Instructions (Signed)
Kayla Campos, thank you for joining Leeanne Rio, PA-C for today's virtual visit.  While this provider is not your primary care provider (PCP), if your PCP is located in our provider database this encounter information will be shared with them immediately following your visit.  Consent: (Patient) Kayla Campos provided verbal consent for this virtual visit at the beginning of the encounter.  Current Medications:  Current Outpatient Medications:    albuterol (VENTOLIN HFA) 108 (90 Base) MCG/ACT inhaler, Inhale 2 puffs into the lungs every 6 (six) hours as needed for up to 10 days for wheezing or shortness of breath., Disp: 18 g, Rfl: 1   Ascorbic Acid (VITAMIN C PO), Take by mouth daily., Disp: , Rfl:    azithromycin (ZITHROMAX Z-PAK) 250 MG tablet, Take 2 tablets (500 mg) on  Day 1,  followed by 1 tablet (250 mg) once daily on Days 2 through 5., Disp: 6 tablet, Rfl: 0   Biotin 10000 MCG TABS, Take by mouth., Disp: , Rfl:    chlorpheniramine-HYDROcodone (TUSSIONEX) 10-8 MG/5ML SUER, Take 5 mLs by mouth every 12 (twelve) hours as needed for cough (cough, will cause drowsiness.)., Disp: 50 mL, Rfl: 0   cholecalciferol (VITAMIN D3) 25 MCG (1000 UNIT) tablet, Take 1,000 Units by mouth daily., Disp: , Rfl:    citalopram (CELEXA) 10 MG tablet, TAKE 1 TABLET (10 MG TOTAL) BY MOUTH DAILY. APPT FOR FURTHER REFILLS, Disp: 15 tablet, Rfl: 0   clonazePAM (KLONOPIN) 0.5 MG tablet, Take 1 tablet (0.5 mg total) by mouth 2 (two) times daily as needed for anxiety., Disp: 20 tablet, Rfl: 0   escitalopram (LEXAPRO) 10 MG tablet, TAKE 1 TABLET BY MOUTH ONCE DAILY, Disp: 90 tablet, Rfl: 1   fluticasone (FLOVENT HFA) 44 MCG/ACT inhaler, Inhale 2 puffs into the lungs in the morning and at bedtime., Disp: 1 each, Rfl: 12   guaiFENesin (MUCINEX) 600 MG 12 hr tablet, Take 2 tablets (1,200 mg total) by mouth 2 (two) times daily., Disp: 30 tablet, Rfl: 2   Insulin Pen Needle 32G X 4 MM MISC, USE  AS DIRECTED TO INJECT SAXENDA, Disp: 100 each, Rfl: 1   levocetirizine (XYZAL) 5 MG tablet, Take 1 tablet (5 mg total) by mouth daily as needed for allergies., Disp: 90 tablet, Rfl: 3   levocetirizine (XYZAL) 5 MG tablet, Take 1 tablet (5 mg total) by mouth daily as needed for allergies, Disp: 90 tablet, Rfl: 2   levonorgestrel-ethinyl estradiol (ALESSE) 0.1-20 MG-MCG tablet, TAKE 1 TABLET EVERY DAY BY MOUTH, Disp: 84 tablet, Rfl: 1   levonorgestrel-ethinyl estradiol (ALESSE) 0.1-20 MG-MCG tablet, Take 1 tablet by mouth daily., Disp: 84 tablet, Rfl: 4   Liraglutide -Weight Management (SAXENDA) 18 MG/3ML SOPN, Inject 3 mg into the skin daily., Disp: 45 mL, Rfl: 1   lisinopril-hydrochlorothiazide (ZESTORETIC) 10-12.5 MG tablet, Take 1 tablet by mouth daily. (Patient taking differently: Take 0.5 tablets by mouth daily.), Disp: 90 tablet, Rfl: 3   montelukast (SINGULAIR) 10 MG tablet, TAKE 1 TABLET (10 MG TOTAL) BY MOUTH AT BEDTIME., Disp: 90 tablet, Rfl: 0   montelukast (SINGULAIR) 10 MG tablet, Take 1 tablet (10 mg total) by mouth at bedtime., Disp: 90 tablet, Rfl: 0   Norgestimate-Ethinyl Estradiol Triphasic 0.18/0.215/0.25 MG-25 MCG tab, Take 1 tablet by mouth daily., Disp: , Rfl:    nystatin-triamcinolone ointment (MYCOLOG), APPLY TO THE AFFECTED AREA(S) 2 TIMES DAILY, Disp: 30 g, Rfl: 1   Red Yeast Rice Extract (RED YEAST RICE PO), Take by  mouth daily., Disp: , Rfl:    TECHLITE PEN NEEDLES 32G X 6 MM MISC, USE AS DIRECTED TO INJECT SAXENDA, Disp: 100 each, Rfl: 1   Zinc 100 MG TABS, Take by mouth., Disp: , Rfl:    Medications ordered in this encounter:  No orders of the defined types were placed in this encounter.    *If you need refills on other medications prior to your next appointment, please contact your pharmacy*  Follow-Up: Call back or seek an in-person evaluation if the symptoms worsen or if the condition fails to improve as anticipated.  Other Instructions Please take antibiotic  as directed.  Increase fluid intake.  Use Saline nasal spray.  Take a daily multivitamin. Continue allergy medications. Start Mucinex-DM.  Place a humidifier in the bedroom.  Please call or return clinic if symptoms are not improving.  I have placed scripts on file at pharmacy for Tessalon (if any worsening cough) and the diflucan (if needed for yeast from antibiotic treatment)  Sinusitis Sinusitis is redness, soreness, and swelling (inflammation) of the paranasal sinuses. Paranasal sinuses are air pockets within the bones of your face (beneath the eyes, the middle of the forehead, or above the eyes). In healthy paranasal sinuses, mucus is able to drain out, and air is able to circulate through them by way of your nose. However, when your paranasal sinuses are inflamed, mucus and air can become trapped. This can allow bacteria and other germs to grow and cause infection. Sinusitis can develop quickly and last only a short time (acute) or continue over a long period (chronic). Sinusitis that lasts for more than 12 weeks is considered chronic.  CAUSES  Causes of sinusitis include: Allergies. Structural abnormalities, such as displacement of the cartilage that separates your nostrils (deviated septum), which can decrease the air flow through your nose and sinuses and affect sinus drainage. Functional abnormalities, such as when the small hairs (cilia) that line your sinuses and help remove mucus do not work properly or are not present. SYMPTOMS  Symptoms of acute and chronic sinusitis are the same. The primary symptoms are pain and pressure around the affected sinuses. Other symptoms include: Upper toothache. Earache. Headache. Bad breath. Decreased sense of smell and taste. A cough, which worsens when you are lying flat. Fatigue. Fever. Thick drainage from your nose, which often is green and may contain pus (purulent). Swelling and warmth over the affected sinuses. DIAGNOSIS  Your caregiver  will perform a physical exam. During the exam, your caregiver may: Look in your nose for signs of abnormal growths in your nostrils (nasal polyps). Tap over the affected sinus to check for signs of infection. View the inside of your sinuses (endoscopy) with a special imaging device with a light attached (endoscope), which is inserted into your sinuses. If your caregiver suspects that you have chronic sinusitis, one or more of the following tests may be recommended: Allergy tests. Nasal culture A sample of mucus is taken from your nose and sent to a lab and screened for bacteria. Nasal cytology A sample of mucus is taken from your nose and examined by your caregiver to determine if your sinusitis is related to an allergy. TREATMENT  Most cases of acute sinusitis are related to a viral infection and will resolve on their own within 10 days. Sometimes medicines are prescribed to help relieve symptoms (pain medicine, decongestants, nasal steroid sprays, or saline sprays).  However, for sinusitis related to a bacterial infection, your caregiver will prescribe antibiotic medicines.  These are medicines that will help kill the bacteria causing the infection.  Rarely, sinusitis is caused by a fungal infection. In theses cases, your caregiver will prescribe antifungal medicine. For some cases of chronic sinusitis, surgery is needed. Generally, these are cases in which sinusitis recurs more than 3 times per year, despite other treatments. HOME CARE INSTRUCTIONS  Drink plenty of water. Water helps thin the mucus so your sinuses can drain more easily. Use a humidifier. Inhale steam 3 to 4 times a day (for example, sit in the bathroom with the shower running). Apply a warm, moist washcloth to your face 3 to 4 times a day, or as directed by your caregiver. Use saline nasal sprays to help moisten and clean your sinuses. Take over-the-counter or prescription medicines for pain, discomfort, or fever only as  directed by your caregiver. SEEK IMMEDIATE MEDICAL CARE IF: You have increasing pain or severe headaches. You have nausea, vomiting, or drowsiness. You have swelling around your face. You have vision problems. You have a stiff neck. You have difficulty breathing. MAKE SURE YOU:  Understand these instructions. Will watch your condition. Will get help right away if you are not doing well or get worse. Document Released: 10/26/2005 Document Revised: 01/18/2012 Document Reviewed: 11/10/2011 [facility] Patient Information 2014 [place], [place].    If you have been instructed to have an in-person evaluation today at a local Urgent Care facility, please use the link below. It will take you to a list of all of our available [facility], including address, phone number and hours of operation. Please do not delay care.  [facility]  If you or a family member do not have a primary care provider, use the link below to schedule a visit and establish care. When you choose a [facility] primary care physician or advanced practice provider, you gain a long-term partner in health. Find a Primary Care Provider  Learn more about [facility]'s in-office and virtual care options: [facility]

## 2021-08-04 ENCOUNTER — Encounter: Payer: No Typology Code available for payment source | Admitting: Physician Assistant

## 2021-08-07 ENCOUNTER — Ambulatory Visit (INDEPENDENT_AMBULATORY_CARE_PROVIDER_SITE_OTHER): Payer: No Typology Code available for payment source | Admitting: Physician Assistant

## 2021-08-07 ENCOUNTER — Other Ambulatory Visit: Payer: Self-pay

## 2021-08-07 ENCOUNTER — Other Ambulatory Visit (HOSPITAL_COMMUNITY): Payer: Self-pay

## 2021-08-07 VITALS — BP 134/86 | HR 80 | Ht 66.0 in | Wt 272.0 lb

## 2021-08-07 DIAGNOSIS — Z131 Encounter for screening for diabetes mellitus: Secondary | ICD-10-CM | POA: Diagnosis not present

## 2021-08-07 DIAGNOSIS — R4589 Other symptoms and signs involving emotional state: Secondary | ICD-10-CM

## 2021-08-07 DIAGNOSIS — R748 Abnormal levels of other serum enzymes: Secondary | ICD-10-CM

## 2021-08-07 DIAGNOSIS — B379 Candidiasis, unspecified: Secondary | ICD-10-CM

## 2021-08-07 DIAGNOSIS — Z0000 Encounter for general adult medical examination without abnormal findings: Secondary | ICD-10-CM

## 2021-08-07 DIAGNOSIS — F410 Panic disorder [episodic paroxysmal anxiety] without agoraphobia: Secondary | ICD-10-CM

## 2021-08-07 DIAGNOSIS — Z1329 Encounter for screening for other suspected endocrine disorder: Secondary | ICD-10-CM

## 2021-08-07 DIAGNOSIS — E7800 Pure hypercholesterolemia, unspecified: Secondary | ICD-10-CM | POA: Diagnosis not present

## 2021-08-07 DIAGNOSIS — J019 Acute sinusitis, unspecified: Secondary | ICD-10-CM

## 2021-08-07 DIAGNOSIS — B9689 Other specified bacterial agents as the cause of diseases classified elsewhere: Secondary | ICD-10-CM

## 2021-08-07 DIAGNOSIS — Z6841 Body Mass Index (BMI) 40.0 and over, adult: Secondary | ICD-10-CM

## 2021-08-07 DIAGNOSIS — Z1159 Encounter for screening for other viral diseases: Secondary | ICD-10-CM

## 2021-08-07 LAB — POCT UA - MICROALBUMIN
Albumin/Creatinine Ratio, Urine, POC: <30
Creatinine, POC: 300 mg/dL
Microalbumin Ur, POC: 10 mg/L

## 2021-08-07 MED ORDER — INSULIN PEN NEEDLE 31G X 6 MM MISC
1.0000 "application " | Freq: Every day | 0 refills | Status: DC
  Filled 2021-08-07: qty 100, 90d supply, fill #0

## 2021-08-07 MED ORDER — AMOXICILLIN-POT CLAVULANATE 875-125 MG PO TABS
1.0000 | ORAL_TABLET | Freq: Two times a day (BID) | ORAL | 0 refills | Status: AC
  Filled 2021-08-07: qty 20, 10d supply, fill #0

## 2021-08-07 MED ORDER — FLUCONAZOLE 150 MG PO TABS
150.0000 mg | ORAL_TABLET | Freq: Every day | ORAL | 0 refills | Status: DC
  Filled 2021-08-07: qty 2, 2d supply, fill #0

## 2021-08-07 MED ORDER — ESCITALOPRAM OXALATE 10 MG PO TABS
10.0000 mg | ORAL_TABLET | Freq: Every day | ORAL | 1 refills | Status: DC
  Filled 2021-08-07: qty 90, 90d supply, fill #0

## 2021-08-07 MED ORDER — SAXENDA 18 MG/3ML ~~LOC~~ SOPN: 3.0000 mg | PEN_INJECTOR | Freq: Every day | SUBCUTANEOUS | 0 refills | Status: DC

## 2021-08-07 NOTE — Patient Instructions (Signed)
Health Maintenance, Female Adopting a healthy lifestyle and getting preventive care are important in promoting health and wellness. Ask your health care provider about: The right schedule for you to have regular tests and exams. Things you can do on your own to prevent diseases and keep yourself healthy. What should I know about diet, weight, and exercise? Eat a healthy diet  Eat a diet that includes plenty of vegetables, fruits, low-fat dairy products, and lean protein. Do not eat a lot of foods that are high in solid fats, added sugars, or sodium. Maintain a healthy weight Body mass index (BMI) is used to identify weight problems. It estimates body fat based on height and weight. Your health care provider can help determine your BMI and help you achieve or maintain a healthy weight. Get regular exercise Get regular exercise. This is one of the most important things you can do for your health. Most adults should: Exercise for at least 150 minutes each week. The exercise should increase your heart rate and make you sweat (moderate-intensity exercise). Do strengthening exercises at least twice a week. This is in addition to the moderate-intensity exercise. Spend less time sitting. Even light physical activity can be beneficial. Watch cholesterol and blood lipids Have your blood tested for lipids and cholesterol at 36 years of age, then have this test every 5 years. Have your cholesterol levels checked more often if: Your lipid or cholesterol levels are high. You are older than 36 years of age. You are at high risk for heart disease. What should I know about cancer screening? Depending on your health history and family history, you may need to have cancer screening at various ages. This may include screening for: Breast cancer. Cervical cancer. Colorectal cancer. Skin cancer. Lung cancer. What should I know about heart disease, diabetes, and high blood pressure? Blood pressure and heart  disease High blood pressure causes heart disease and increases the risk of stroke. This is more likely to develop in people who have high blood pressure readings, are of African descent, or are overweight. Have your blood pressure checked: Every 3-5 years if you are 18-39 years of age. Every year if you are 40 years old or older. Diabetes Have regular diabetes screenings. This checks your fasting blood sugar level. Have the screening done: Once every three years after age 40 if you are at a normal weight and have a low risk for diabetes. More often and at a younger age if you are overweight or have a high risk for diabetes. What should I know about preventing infection? Hepatitis B If you have a higher risk for hepatitis B, you should be screened for this virus. Talk with your health care provider to find out if you are at risk for hepatitis B infection. Hepatitis C Testing is recommended for: Everyone born from 1945 through 1965. Anyone with known risk factors for hepatitis C. Sexually transmitted infections (STIs) Get screened for STIs, including gonorrhea and chlamydia, if: You are sexually active and are younger than 36 years of age. You are older than 36 years of age and your health care provider tells you that you are at risk for this type of infection. Your sexual activity has changed since you were last screened, and you are at increased risk for chlamydia or gonorrhea. Ask your health care provider if you are at risk. Ask your health care provider about whether you are at high risk for HIV. Your health care provider may recommend a prescription medicine   to help prevent HIV infection. If you choose to take medicine to prevent HIV, you should first get tested for HIV. You should then be tested every 3 months for as long as you are taking the medicine. Pregnancy If you are about to stop having your period (premenopausal) and you may become pregnant, seek counseling before you get  pregnant. Take 400 to 800 micrograms (mcg) of folic acid every day if you become pregnant. Ask for birth control (contraception) if you want to prevent pregnancy. Osteoporosis and menopause Osteoporosis is a disease in which the bones lose minerals and strength with aging. This can result in bone fractures. If you are 65 years old or older, or if you are at risk for osteoporosis and fractures, ask your health care provider if you should: Be screened for bone loss. Take a calcium or vitamin D supplement to lower your risk of fractures. Be given hormone replacement therapy (HRT) to treat symptoms of menopause. Follow these instructions at home: Lifestyle Do not use any products that contain nicotine or tobacco, such as cigarettes, e-cigarettes, and chewing tobacco. If you need help quitting, ask your health care provider. Do not use street drugs. Do not share needles. Ask your health care provider for help if you need support or information about quitting drugs. Alcohol use Do not drink alcohol if: Your health care provider tells you not to drink. You are pregnant, may be pregnant, or are planning to become pregnant. If you drink alcohol: Limit how much you use to 0-1 drink a day. Limit intake if you are breastfeeding. Be aware of how much alcohol is in your drink. In the [place] one drink equals one 12 oz bottle of beer (355 mL), one 5 oz glass of wine (148 mL), or one 1 oz glass of hard liquor (44 mL). General instructions Schedule regular health, dental, and eye exams. Stay current with your vaccines. Tell your health care provider if: You often feel depressed. You have ever been abused or do not feel safe at home. Summary Adopting a healthy lifestyle and getting preventive care are important in promoting health and wellness. Follow your health care provider's instructions about healthy diet, exercising, and getting tested or screened for diseases. Follow your health care provider's  instructions on monitoring your cholesterol and blood pressure. This information is not intended to replace advice given to you by your health care provider. Make sure you discuss any questions you have with your health care provider. Document Revised: 01/03/2021 Document Reviewed: 10/19/2018 [employer] Patient Education  2022 [employer]  

## 2021-08-07 NOTE — Progress Notes (Signed)
Subjective:     Kayla Campos is a 35 y.o. female and is here for a comprehensive physical exam. The patient reports problems - she is having a lot of panic attacks and anxiety due to relationship problems with her husband. While they were separated he got a girl pregnant. She is not wanting to work on their relationship but there is a lot of baggage. She has gained the weight back that she lost a few years ago due to stress. She is concerned about some underlying autoimmune disease. Her CK levels were really elevated at one point.  .   Given doxy for sinusitis with virtual doctor. Stopped due to GI symptoms. Would like to try augmentin.     Social History   Socioeconomic History   Marital status: Married    Spouse name: Not on file   Number of children: Not on file   Years of education: Not on file   Highest education level: Not on file  Occupational History   Not on file  Tobacco Use   Smoking status: Never   Smokeless tobacco: Never  Vaping Use   Vaping Use: Never used  Substance and Sexual Activity   Alcohol use: No   Drug use: No   Sexual activity: Yes    Birth control/protection: Pill  Other Topics Concern   Not on file  Social History Narrative   Not on file   Social Determinants of Health   Financial Resource Strain: Not on file  Food Insecurity: Not on file  Transportation Needs: Not on file  Physical Activity: Not on file  Stress: Not on file  Social Connections: Not on file  Intimate Partner Violence: Not on file   Health Maintenance  Topic Date Due   COVID-19 Vaccine (4 - Booster for [place] series) 08/23/2021 (Originally 11/15/2020)   INFLUENZA VACCINE  02/06/2022 (Originally 06/09/2021)   HIV Screening  08/07/2022 (Originally 03/09/2000)   TETANUS/TDAP  02/20/2023   PAP SMEAR-Modifier  03/22/2023   Hepatitis C Screening  Completed   HPV VACCINES  Aged Out    The following portions of the patient's history were reviewed and updated as appropriate:  allergies, current medications, past family history, past medical history, past social history, past surgical history, and problem list.  Review of Systems Pertinent items noted in HPI and remainder of comprehensive ROS otherwise negative.   Objective:    BP 134/86   Pulse 80   Ht 5' 6"  (1.676 m)   Wt 272 lb (123.4 kg)   SpO2 100%   BMI 43.90 kg/m  General appearance: alert, cooperative, appears stated age, and moderately obese Head: Normocephalic, without obvious abnormality, atraumatic Eyes: conjunctivae/corneas clear. PERRL, EOM's intact. Fundi benign. Ears: normal TM's and external ear canals both ears Nose: Nares normal. Septum midline. Mucosa normal. No drainage or sinus tenderness. Throat: lips, mucosa, and tongue normal; teeth and gums normal Neck: no adenopathy, no carotid bruit, no JVD, supple, symmetrical, trachea midline, and thyroid not enlarged, symmetric, no tenderness/mass/nodules Back: symmetric, no curvature. ROM normal. No CVA tenderness. Lungs: clear to auscultation bilaterally Heart: regular rate and rhythm, S1, S2 normal, no murmur, click, rub or gallop Abdomen: soft, non-tender; bowel sounds normal; no masses,  no organomegaly Extremities: extremities normal, atraumatic, no cyanosis or edema Pulses: 2+ and symmetric Skin: Skin color, texture, turgor normal. No rashes or lesions Lymph nodes: Cervical, supraclavicular, and axillary nodes normal. Neurologic: Grossly normal    .. Depression screen [facility] 2/9 08/07/2021 10/18/2020 12/12/2019 01/23/2019  08/02/2017  Decreased Interest 0 0 0 0 0  Down, Depressed, Hopeless 1 0 1 0 0  PHQ - 2 Score 1 0 1 0 0  Altered sleeping 1 1 1  - -  Tired, decreased energy 0 1 1 - -  Change in appetite 3 1 1  - -  Feeling bad or failure about yourself  2 1 1  - -  Trouble concentrating 2 3 1  - -  Moving slowly or fidgety/restless 0 1 0 - -  Suicidal thoughts 0 0 0 - -  PHQ-9 Score 9 8 6  - -  Difficult doing work/chores Somewhat  difficult Somewhat difficult Not difficult at all - -   .Marland Kitchen GAD 7 : Generalized Anxiety Score 08/07/2021 10/18/2020 12/12/2019  Nervous, Anxious, on Edge 1 1 1   Control/stop worrying 2 1 0  Worry too much - different things 1 1 0  Trouble relaxing 0 1 1  Restless 1 1 0  Easily annoyed or irritable 1 1 0  Afraid - awful might happen 0 0 0  Total GAD 7 Score 6 6 2   Anxiety Difficulty Somewhat difficult Extremely difficult Not difficult at all   .Marland Kitchen Results for orders placed or performed in visit on 08/07/21  TSH  Result Value Ref Range   TSH 1.37 mIU/L  Lipid Panel w/reflex Direct LDL  Result Value Ref Range   Cholesterol 199 <200 mg/dL   HDL 60 > OR = 50 mg/dL   Triglycerides 176 (H) <150 mg/dL   LDL Cholesterol (Calc) 110 (H) mg/dL (calc)   Total CHOL/HDL Ratio 3.3 <5.0 (calc)   Non-HDL Cholesterol (Calc) 139 (H) <130 mg/dL (calc)  COMPLETE METABOLIC PANEL WITH GFR  Result Value Ref Range   Glucose, Bld 80 65 - 99 mg/dL   BUN 14 7 - 25 mg/dL   Creat 0.52 0.50 - 0.97 mg/dL   eGFR 123 > OR = 60 mL/min/1.54m   BUN/Creatinine Ratio NOT APPLICABLE 6 - 22 (calc)   Sodium 137 135 - 146 mmol/L   Potassium 4.0 3.5 - 5.3 mmol/L   Chloride 101 98 - 110 mmol/L   CO2 24 20 - 32 mmol/L   Calcium 9.5 8.6 - 10.2 mg/dL   Total Protein 7.1 6.1 - 8.1 g/dL   Albumin 4.0 3.6 - 5.1 g/dL   Globulin 3.1 1.9 - 3.7 g/dL (calc)   AG Ratio 1.3 1.0 - 2.5 (calc)   Total Bilirubin 0.4 0.2 - 1.2 mg/dL   Alkaline phosphatase (APISO) 42 31 - 125 U/L   AST 20 10 - 30 U/L   ALT 16 6 - 29 U/L  CBC with Differential/Platelet  Result Value Ref Range   WBC 10.9 (H) 3.8 - 10.8 Thousand/uL   RBC 4.49 3.80 - 5.10 Million/uL   Hemoglobin 13.5 11.7 - 15.5 g/dL   HCT 40.9 35.0 - 45.0 %   MCV 91.1 80.0 - 100.0 fL   MCH 30.1 27.0 - 33.0 pg   MCHC 33.0 32.0 - 36.0 g/dL   RDW 12.0 11.0 - 15.0 %   Platelets 301 140 - 400 Thousand/uL   MPV 9.4 7.5 - 12.5 fL   Neutro Abs 6,300 1,500 - 7,800 cells/uL   Lymphs Abs  3,564 850 - 3,900 cells/uL   Absolute Monocytes 687 200 - 950 cells/uL   Eosinophils Absolute 283 15 - 500 cells/uL   Basophils Absolute 65 0 - 200 cells/uL   Neutrophils Relative % 57.8 %   Total Lymphocyte 32.7 %  Monocytes Relative 6.3 %   Eosinophils Relative 2.6 %   Basophils Relative 0.6 %  Hepatitis C Antibody  Result Value Ref Range   Hepatitis C Ab NON-REACTIVE NON-REACTIVE   SIGNAL TO CUT-OFF 0.01 <1.00  POCT UA - Microalbumin  Result Value Ref Range   Microalbumin Ur, POC 10 mg/L   Creatinine, POC 300 mg/dL   Albumin/Creatinine Ratio, Urine, POC <30      Assessment:    Healthy female exam.      Plan:    .Marland KitchenDecie was seen today for annual exam.  Diagnoses and all orders for this visit:  Routine physical examination -     TSH -     Lipid Panel w/reflex Direct LDL -     COMPLETE METABOLIC PANEL WITH GFR -     CBC with Differential/Platelet  Elevated LDL cholesterol level -     Lipid Panel w/reflex Direct LDL  Screening for diabetes mellitus -     COMPLETE METABOLIC PANEL WITH GFR -     POCT UA - Microalbumin  Thyroid disorder screen -     TSH  Encounter for hepatitis C screening test for low risk patient -     Hepatitis C Antibody  Elevated CK -     CK -     POCT UA - Microalbumin  Class 3 severe obesity due to excess calories without serious comorbidity with body mass index (BMI) of 40.0 to 44.9 in adult (HCC) -     Liraglutide -Weight Management (SAXENDA) 18 MG/3ML SOPN; Inject 3 mg into the skin daily. 0.6 mg inj subcut daily for 1 week, then incr by 0.6 mg weekly until reaching 3 mg injected subcut daily -     Insulin Pen Needle 31G X 6 MM MISC; Use to inject once daily  Yeast infection -     fluconazole (DIFLUCAN) 150 MG tablet; Take 1 tablet (150 mg total) by mouth daily.  Acute bacterial sinusitis -     amoxicillin-clavulanate (AUGMENTIN) 875-125 MG tablet; Take 1 tablet by mouth 2 (two) times daily for 10 days.  Panic attacks -      escitalopram (LEXAPRO) 10 MG tablet; Take 1 tablet (10 mg total) by mouth daily.  Depressed mood -     Liraglutide -Weight Management (SAXENDA) 18 MG/3ML SOPN; Inject 3 mg into the skin daily. 0.6 mg inj subcut daily for 1 week, then incr by 0.6 mg weekly until reaching 3 mg injected subcut daily  .Marland Kitchen Discussed 150 minutes of exercise a week.  Encouraged vitamin D 1000 units and Calcium 1352m or 4 servings of dairy a day.  PHQ/GAD not to goal. She is having a lot of problems with her relationship with her husband. Added lexapro 161m Follow up in 3 months.  Pap is UTD.  Will get fasting labs and check CK and ANA.  No protein in urine.  Covid vaccine x3.  Will get flu shot.   ..Marland Kitcheniscussed low carb diet with 1500 calories and 80g of protein.  Exercising at least 150 minutes a week.  My [employer]itness Pal could be a grMicrobiologist Restarted saxenda. Discussed side effects.  Once she titrates up will conside switching to wegovy.  Follow up in 3 months.   Start augmentin to finish treatment of sinusitis since cannot tolerate doxy.   See After Visit Summary for Counseling Recommendations

## 2021-08-08 LAB — CBC WITH DIFFERENTIAL/PLATELET
Absolute Monocytes: 687 cells/uL (ref 200–950)
Basophils Absolute: 65 cells/uL (ref 0–200)
Basophils Relative: 0.6 %
Eosinophils Absolute: 283 cells/uL (ref 15–500)
Eosinophils Relative: 2.6 %
HCT: 40.9 % (ref 35.0–45.0)
Hemoglobin: 13.5 g/dL (ref 11.7–15.5)
Lymphs Abs: 3564 cells/uL (ref 850–3900)
MCH: 30.1 pg (ref 27.0–33.0)
MCHC: 33 g/dL (ref 32.0–36.0)
MCV: 91.1 fL (ref 80.0–100.0)
MPV: 9.4 fL (ref 7.5–12.5)
Monocytes Relative: 6.3 %
Neutro Abs: 6300 cells/uL (ref 1500–7800)
Neutrophils Relative %: 57.8 %
Platelets: 301 10*3/uL (ref 140–400)
RBC: 4.49 10*6/uL (ref 3.80–5.10)
RDW: 12 % (ref 11.0–15.0)
Total Lymphocyte: 32.7 %
WBC: 10.9 10*3/uL — ABNORMAL HIGH (ref 3.8–10.8)

## 2021-08-08 LAB — COMPLETE METABOLIC PANEL WITH GFR
AG Ratio: 1.3 (calc) (ref 1.0–2.5)
ALT: 16 U/L (ref 6–29)
AST: 20 U/L (ref 10–30)
Albumin: 4 g/dL (ref 3.6–5.1)
Alkaline phosphatase (APISO): 42 U/L (ref 31–125)
BUN: 14 mg/dL (ref 7–25)
CO2: 24 mmol/L (ref 20–32)
Calcium: 9.5 mg/dL (ref 8.6–10.2)
Chloride: 101 mmol/L (ref 98–110)
Creat: 0.52 mg/dL (ref 0.50–0.97)
Globulin: 3.1 g/dL (calc) (ref 1.9–3.7)
Glucose, Bld: 80 mg/dL (ref 65–99)
Potassium: 4 mmol/L (ref 3.5–5.3)
Sodium: 137 mmol/L (ref 135–146)
Total Bilirubin: 0.4 mg/dL (ref 0.2–1.2)
Total Protein: 7.1 g/dL (ref 6.1–8.1)
eGFR: 123 mL/min/{1.73_m2} (ref >=60)

## 2021-08-08 LAB — LIPID PANEL W/REFLEX DIRECT LDL
Cholesterol: 199 mg/dL (ref <200)
HDL: 60 mg/dL (ref >=50)
LDL Cholesterol (Calc): 110 mg/dL (calc) — ABNORMAL HIGH
Non-HDL Cholesterol (Calc): 139 mg/dL (calc) — ABNORMAL HIGH (ref <130)
Total CHOL/HDL Ratio: 3.3 (calc) (ref <5.0)
Triglycerides: 176 mg/dL — ABNORMAL HIGH (ref <150)

## 2021-08-08 LAB — HEPATITIS C ANTIBODY
Hepatitis C Ab: NONREACTIVE
SIGNAL TO CUT-OFF: 0.01 (ref <1.00)

## 2021-08-08 LAB — TSH: TSH: 1.37 mIU/L

## 2021-08-08 NOTE — Progress Notes (Signed)
Kayla Campos,   Thyroid looks great.  Cholesterol looks better than years past.  TG up.  Kidney, liver, glucose looks great.

## 2021-08-17 ENCOUNTER — Encounter: Payer: Self-pay | Admitting: Physician Assistant

## 2021-08-17 DIAGNOSIS — R748 Abnormal levels of other serum enzymes: Secondary | ICD-10-CM | POA: Insufficient documentation

## 2021-09-19 ENCOUNTER — Encounter: Payer: Self-pay | Admitting: Physician Assistant

## 2021-09-19 ENCOUNTER — Other Ambulatory Visit: Payer: Self-pay | Admitting: Physician Assistant

## 2021-09-19 ENCOUNTER — Other Ambulatory Visit (HOSPITAL_COMMUNITY): Payer: Self-pay

## 2021-09-19 ENCOUNTER — Telehealth: Payer: No Typology Code available for payment source | Admitting: Physician Assistant

## 2021-09-19 DIAGNOSIS — L03011 Cellulitis of right finger: Secondary | ICD-10-CM

## 2021-09-19 MED ORDER — SULFAMETHOXAZOLE-TRIMETHOPRIM 800-160 MG PO TABS
1.0000 | ORAL_TABLET | Freq: Two times a day (BID) | ORAL | 0 refills | Status: DC
  Filled 2021-09-19: qty 14, 7d supply, fill #0

## 2021-09-19 MED ORDER — IBUPROFEN 800 MG PO TABS
800.0000 mg | ORAL_TABLET | Freq: Three times a day (TID) | ORAL | 0 refills | Status: DC | PRN
  Filled 2021-09-19: qty 30, 10d supply, fill #0

## 2021-09-19 MED ORDER — FLUCONAZOLE 150 MG PO TABS
150.0000 mg | ORAL_TABLET | Freq: Once | ORAL | 0 refills | Status: AC
  Filled 2021-09-19: qty 1, 1d supply, fill #0

## 2021-09-19 MED ORDER — MONTELUKAST SODIUM 10 MG PO TABS
10.0000 mg | ORAL_TABLET | Freq: Every day | ORAL | 3 refills | Status: DC
  Filled 2021-09-19: qty 90, 90d supply, fill #0
  Filled 2021-12-22: qty 90, 90d supply, fill #1
  Filled 2022-03-17: qty 90, 90d supply, fill #2
  Filled 2022-06-16: qty 90, 90d supply, fill #3

## 2021-09-19 NOTE — Progress Notes (Signed)
Virtual Visit Consent   Kayla Campos, you are scheduled for a virtual visit with a [facility] provider today.     Just as with appointments in the office, your consent must be obtained to participate.  Your consent will be active for this visit and any virtual visit you may have with one of our providers in the next 365 days.     If you have a MyChart account, a copy of this consent can be sent to you electronically.  All virtual visits are billed to your insurance company just like a traditional visit in the office.    As this is a virtual visit, video technology does not allow for your provider to perform a traditional examination.  This may limit your provider's ability to fully assess your condition.  If your provider identifies any concerns that need to be evaluated in person or the need to arrange testing (such as labs, EKG, etc.), we will make arrangements to do so.     Although advances in technology are sophisticated, we cannot ensure that it will always work on either your end or our end.  If the connection with a video visit is poor, the visit may have to be switched to a telephone visit.  With either a video or telephone visit, we are not always able to ensure that we have a secure connection.     I need to obtain your verbal consent now.   Are you willing to proceed with your visit today?    Kayla Campos has provided verbal consent on 09/19/2021 for a virtual visit (video or telephone).   Mar Daring, PA-C   Date: 09/19/2021 11:18 AM   Virtual Visit via Video Note   I, Mar Daring, connected with  Kayla Campos  ([id number], April 12, 1986) on 09/19/21 at 11:15 AM EST by a video-enabled telemedicine application and verified that I am speaking with the correct person using two identifiers.  Location: Patient: Virtual Visit Location Patient: Mobile Provider: Virtual Visit Location Provider: Home Office   I discussed the limitations  of evaluation and management by telemedicine and the availability of in person appointments. The patient expressed understanding and agreed to proceed.    History of Present Illness: Kayla Campos is a 35 y.o. who identifies as a female who was assigned female at birth, and is being seen today for possible infection of the finger. Got her nails done yesterday and today noticed her right index finger around the nail is red, swollen, throbbing and very tender. Denies any fevers, chills, nausea, vomiting, or discharge.   Problems:  Patient Active Problem List   Diagnosis Date Noted   Elevated CK 08/17/2021   Influenza A 02/26/2021   Anxiety 10/18/2020   Elevated LDL cholesterol level 07/30/2020   Vitiligo 07/30/2020   Allergic conjunctivitis 01/03/2020   Mild persistent asthma 01/03/2020   Food allergy 01/03/2020   Binge-eating disorder, mild 12/12/2019   Perennial and seasonal allergic rhinitis 12/12/2019   Wheezing 12/12/2019   Hematuria 02/26/2016   Stress 10/29/2015   Abnormal weight gain 10/29/2015   Hair loss 10/29/2015   Class 3 severe obesity due to excess calories without serious comorbidity with body mass index (BMI) of 40.0 to 44.9 in adult ([place]) 10/29/2015   BMI 36.0-36.9,adult 12/11/2014   Hyperpigmentation of skin of cheek 12/11/2014   Fatty liver disease, nonalcoholic 37/16/9678   Bloating 05/25/2014   Gastroesophageal reflux disease without esophagitis 05/25/2014   Nausea alone 05/25/2014  Essential hypertension, benign 05/25/2014   Keratosis pilaris 10/11/2013    Allergies:  Allergies  Allergen Reactions   Keflex [Cephalexin] Hives   Medications:  Current Outpatient Medications:    fluconazole (DIFLUCAN) 150 MG tablet, Take 1 tablet (150 mg total) by mouth once for 1 dose., Disp: 1 tablet, Rfl: 0   ibuprofen (ADVIL) 800 MG tablet, Take 1 tablet (800 mg total) by mouth every 8 (eight) hours as needed., Disp: 30 tablet, Rfl: 0    sulfamethoxazole-trimethoprim (BACTRIM DS) 800-160 MG tablet, Take 1 tablet by mouth 2 (two) times daily., Disp: 14 tablet, Rfl: 0   Biotin 10000 MCG TABS, Take by mouth., Disp: , Rfl:    cholecalciferol (VITAMIN D3) 25 MCG (1000 UNIT) tablet, Take 1,000 Units by mouth daily., Disp: , Rfl:    clonazePAM (KLONOPIN) 0.5 MG tablet, Take 1 tablet (0.5 mg total) by mouth 2 (two) times daily as needed for anxiety., Disp: 20 tablet, Rfl: 0   escitalopram (LEXAPRO) 10 MG tablet, Take 1 tablet (10 mg total) by mouth daily., Disp: 90 tablet, Rfl: 1   Insulin Pen Needle 31G X 6 MM MISC, Use to inject once daily, Disp: 100 each, Rfl: 0   levocetirizine (XYZAL) 5 MG tablet, Take 1 tablet (5 mg total) by mouth daily as needed for allergies, Disp: 90 tablet, Rfl: 2   levonorgestrel-ethinyl estradiol (ALESSE) 0.1-20 MG-MCG tablet, Take 1 tablet by mouth daily., Disp: 84 tablet, Rfl: 4   Liraglutide -Weight Management (SAXENDA) 18 MG/3ML SOPN, Inject 3 mg into the skin daily. 0.6 mg inj subcut daily for 1 week, then incr by 0.6 mg weekly until reaching 3 mg injected subcut daily, Disp: 15 mL, Rfl: 0   montelukast (SINGULAIR) 10 MG tablet, Take 1 tablet (10 mg total) by mouth at bedtime., Disp: 90 tablet, Rfl: 0   Zinc 100 MG TABS, Take by mouth., Disp: , Rfl:   Observations/Objective: Patient is well-developed, well-nourished in no acute distress.  Resting comfortably    Head is normocephalic, atraumatic.  No labored breathing.  Speech is clear and coherent with logical content.  Patient is alert and oriented at baseline.  Right index finger is erythematous and swollen on the lateral nail fold  Assessment and Plan: 1. Paronychia of right index finger - sulfamethoxazole-trimethoprim (BACTRIM DS) 800-160 MG tablet; Take 1 tablet by mouth 2 (two) times daily.  Dispense: 14 tablet; Refill: 0 - fluconazole (DIFLUCAN) 150 MG tablet; Take 1 tablet (150 mg total) by mouth once for 1 dose.  Dispense: 1 tablet; Refill:  0 - ibuprofen (ADVIL) 800 MG tablet; Take 1 tablet (800 mg total) by mouth every 8 (eight) hours as needed.  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 Diflucan given - Ibuprofen for pain and swelling - Seek in person evaluation if worsening or fails to improve   Follow Up Instructions: I discussed the assessment and treatment plan with the patient. The patient was provided an opportunity to ask questions and all were answered. The patient agreed with the plan and demonstrated an understanding of the instructions.  A copy of instructions were sent to the patient via MyChart unless otherwise noted below.    The patient was advised to call back or seek an in-person evaluation if the symptoms worsen or if the condition fails to  improve as anticipated.  Time:  I spent 10 minutes with the patient via telehealth technology discussing the above problems/concerns.    Mar Daring, PA-C

## 2021-09-19 NOTE — Patient Instructions (Signed)
Kayla Campos, thank you for joining Mar Daring, PA-C for today's virtual visit.  While this provider is not your primary care provider (PCP), if your PCP is located in our provider database this encounter information will be shared with them immediately following your visit.  Consent: (Patient) Kayla Campos provided verbal consent for this virtual visit at the beginning of the encounter.  Current Medications:  Current Outpatient Medications:    fluconazole (DIFLUCAN) 150 MG tablet, Take 1 tablet (150 mg total) by mouth once for 1 dose., Disp: 1 tablet, Rfl: 0   ibuprofen (ADVIL) 800 MG tablet, Take 1 tablet (800 mg total) by mouth every 8 (eight) hours as needed., Disp: 30 tablet, Rfl: 0   sulfamethoxazole-trimethoprim (BACTRIM DS) 800-160 MG tablet, Take 1 tablet by mouth 2 (two) times daily., Disp: 14 tablet, Rfl: 0   Biotin 10000 MCG TABS, Take by mouth., Disp: , Rfl:    cholecalciferol (VITAMIN D3) 25 MCG (1000 UNIT) tablet, Take 1,000 Units by mouth daily., Disp: , Rfl:    clonazePAM (KLONOPIN) 0.5 MG tablet, Take 1 tablet (0.5 mg total) by mouth 2 (two) times daily as needed for anxiety., Disp: 20 tablet, Rfl: 0   escitalopram (LEXAPRO) 10 MG tablet, Take 1 tablet (10 mg total) by mouth daily., Disp: 90 tablet, Rfl: 1   Insulin Pen Needle 31G X 6 MM MISC, Use to inject once daily, Disp: 100 each, Rfl: 0   levocetirizine (XYZAL) 5 MG tablet, Take 1 tablet (5 mg total) by mouth daily as needed for allergies, Disp: 90 tablet, Rfl: 2   levonorgestrel-ethinyl estradiol (ALESSE) 0.1-20 MG-MCG tablet, Take 1 tablet by mouth daily., Disp: 84 tablet, Rfl: 4   Liraglutide -Weight Management (SAXENDA) 18 MG/3ML SOPN, Inject 3 mg into the skin daily. 0.6 mg inj subcut daily for 1 week, then incr by 0.6 mg weekly until reaching 3 mg injected subcut daily, Disp: 15 mL, Rfl: 0   montelukast (SINGULAIR) 10 MG tablet, Take 1 tablet (10 mg total) by mouth at bedtime., Disp: 90  tablet, Rfl: 0   Zinc 100 MG TABS, Take by mouth., Disp: , Rfl:    Medications ordered in this encounter:  Meds ordered this encounter  Medications   sulfamethoxazole-trimethoprim (BACTRIM DS) 800-160 MG tablet    Sig: Take 1 tablet by mouth 2 (two) times daily.    Dispense:  14 tablet    Refill:  0    Order Specific Question:   Supervising Provider    Answer:   MILLER, BRIAN [[id number]]   fluconazole (DIFLUCAN) 150 MG tablet    Sig: Take 1 tablet (150 mg total) by mouth once for 1 dose.    Dispense:  1 tablet    Refill:  0    Order Specific Question:   Supervising Provider    Answer:   MILLER, BRIAN [[id number]]   ibuprofen (ADVIL) 800 MG tablet    Sig: Take 1 tablet (800 mg total) by mouth every 8 (eight) hours as needed.    Dispense:  30 tablet    Refill:  0    Order Specific Question:   Supervising Provider    Answer:   Sabra Heck, BRIAN [[id number]]     *If you need refills on other medications prior to your next appointment, please contact your pharmacy*  Follow-Up: Call back or seek an in-person evaluation if the symptoms worsen or if the condition fails to improve as anticipated.  Other Instructions Paronychia Paronychia is an infection of  the skin that surrounds a nail. It usually affects the skin around a fingernail, but it may also occur near a toenail. It often causes pain and swelling around the nail. In some cases, a collection of pus (abscess) can form near or under the nail.  This condition may develop suddenly, or it may develop gradually over a longer period. In most cases, paronychia is not serious, and it will clear up with treatment. What are the causes? This condition may be caused by bacteria or a fungus, such as yeast. The bacteria or fungus can enter the body through an opening in the skin, such as a cut or a hangnail, and cause an infection in your fingernail or toenail. Other causes may include: Recurrent injury to the fingernail or toenail area. Irritation of the base and  sides of the nail (cuticle). Injury and irritation can result in inflammation, swelling, and thickened skin around the nail. What increases the risk? This condition is more likely to develop in people who: Get their hands wet often, such as those who work as Designer, industrial/product, bartenders, or housekeepers. Bite their fingernails or cuticles. Have underlying skin conditions. Have hangnails or injured fingertips. Are exposed to irritants like detergents and other chemicals. Have diabetes. What are the signs or symptoms? Symptoms of this condition include: Redness and swelling of the skin near the nail. Tenderness around the nail when you touch the area. Pus-filled bumps under the cuticle. Fluid or pus under the nail. Throbbing pain in the area. How is this diagnosed? This condition is diagnosed with a physical exam. In some cases, a sample of pus may be tested to determine what type of bacteria or fungus is causing the condition. How is this treated? Treatment depends on the cause and severity of your condition. If your condition is mild, it may clear up on its own in a few days or after soaking in warm water. If needed, treatment may include: Antibiotic medicine, if your infection is caused by bacteria. Antifungal medicine, if your infection is caused by a fungus. A procedure to drain pus from an abscess. Anti-inflammatory medicine (corticosteroids). Removal of part of an ingrown toenail. A bandage (dressing) may be placed over the affected area if an abscess or part of a nail has been removed. Follow these instructions at home: Wound care Keep the affected area clean. Soak the affected area in warm water if told to do so by your health care provider. You may be told to do this for 20 minutes, 2-3 times a day. Keep the area dry when you are not soaking it. Do not try to drain an abscess yourself. Follow instructions from your health care provider about how to take care of the affected area.  Make sure you: Wash your hands with soap and water for at least 20 seconds before and after you change your dressing. If soap and water are not available, use hand sanitizer. Change your dressing as told by your health care provider. If you had an abscess drained, check the area every day for signs of infection. Check for: Redness, swelling, or pain. Fluid or blood. Warmth. Pus or a bad smell. Medicines  Take over-the-counter and prescription medicines only as told by your health care provider. If you were prescribed an antibiotic medicine, take it as told by your health care provider. Do not stop taking the antibiotic even if you start to feel better. General instructions Avoid contact with any skin irritants or allergens. Do not pick at the  affected area. Keep all follow-up visits as told. This is important. Prevention To prevent this condition from happening again: Wear rubber gloves when washing dishes or doing other tasks that require your hands to get wet. Wear gloves if your hands might come in contact with cleaners or other chemicals. Avoid injuring your nails or fingertips. Do not bite your nails or tear hangnails. Do not cut your nails very short. Do not cut your cuticles. Use clean nail clippers or scissors when trimming nails. Contact a health care provider if: Your symptoms get worse or do not improve with treatment. You have continued or increased fluid, blood, or pus coming from the affected area. Your affected finger, toe, or joint becomes swollen or difficult to move. You have a fever or chills. There is redness spreading away from the affected area. Summary Paronychia is an infection of the skin that surrounds a nail. It often causes pain and swelling around the nail. In some cases, a collection of pus (abscess) can form near or under the nail. This condition may be caused by bacteria or a fungus. These germs can enter the body through an opening in the skin, such  as a cut or a hangnail. If your condition is mild, it may clear up on its own in a few days. If needed, treatment may include medicine or a procedure to drain pus from an abscess. To prevent this condition from happening again, wear gloves if doing tasks that require your hands to get wet or to come in contact with chemicals. Also avoid injuring your nails or fingertips. This information is not intended to replace advice given to you by your health care provider. Make sure you discuss any questions you have with your health care provider. Document Revised: 01/27/2021 Document Reviewed: 01/27/2021 Elsevier Patient Education  2022 [employer].    If you have been instructed to have an in-person evaluation today at a local Urgent Care facility, please use the link below. It will take you to a list of all of our available [facility], including address, phone number and hours of operation. Please do not delay care.  [facility]  If you or a family member do not have a primary care provider, use the link below to schedule a visit and establish care. When you choose a [facility] primary care physician or advanced practice provider, you gain a long-term partner in health. Find a Primary Care Provider  Learn more about [facility]'s in-office and virtual care options: [facility]

## 2021-09-22 ENCOUNTER — Other Ambulatory Visit (HOSPITAL_COMMUNITY): Payer: Self-pay

## 2021-09-22 MED ORDER — WEGOVY 2.4 MG/0.75ML ~~LOC~~ SOAJ
2.4000 mg | SUBCUTANEOUS | 1 refills | Status: DC
  Filled 2021-09-22 – 2021-09-23 (×2): qty 9, 84d supply, fill #0
  Filled 2021-09-26: qty 6, 84d supply, fill #0
  Filled 2021-09-30: qty 3, 28d supply, fill #0
  Filled 2021-10-01: qty 9, 84d supply, fill #0
  Filled 2021-12-23: qty 9, 84d supply, fill #1

## 2021-09-23 ENCOUNTER — Other Ambulatory Visit (HOSPITAL_COMMUNITY): Payer: Self-pay

## 2021-09-23 ENCOUNTER — Encounter: Payer: Self-pay | Admitting: Physician Assistant

## 2021-09-26 ENCOUNTER — Other Ambulatory Visit (HOSPITAL_COMMUNITY): Payer: Self-pay

## 2021-09-29 ENCOUNTER — Telehealth: Payer: Self-pay

## 2021-09-29 NOTE — Telephone Encounter (Signed)
Medication: WEGOVY 2.4 MG/0.75ML SOAJ Prior authorization submitted via CoverMyMeds on 09/29/2021 PA submission pending

## 2021-09-30 ENCOUNTER — Other Ambulatory Visit (HOSPITAL_COMMUNITY): Payer: Self-pay

## 2021-09-30 NOTE — Telephone Encounter (Signed)
PA approved. Pharmacy notified 

## 2021-10-01 ENCOUNTER — Other Ambulatory Visit (HOSPITAL_COMMUNITY): Payer: Self-pay

## 2021-10-21 ENCOUNTER — Telehealth: Payer: No Typology Code available for payment source | Admitting: Physician Assistant

## 2021-10-21 ENCOUNTER — Other Ambulatory Visit (HOSPITAL_COMMUNITY): Payer: Self-pay

## 2021-10-21 ENCOUNTER — Encounter: Payer: Self-pay | Admitting: Physician Assistant

## 2021-10-21 DIAGNOSIS — B9789 Other viral agents as the cause of diseases classified elsewhere: Secondary | ICD-10-CM | POA: Diagnosis not present

## 2021-10-21 DIAGNOSIS — J329 Chronic sinusitis, unspecified: Secondary | ICD-10-CM | POA: Diagnosis not present

## 2021-10-21 MED ORDER — FLUTICASONE PROPIONATE 50 MCG/ACT NA SUSP
2.0000 | Freq: Every day | NASAL | 0 refills | Status: DC
  Filled 2021-10-21: qty 16, 30d supply, fill #0

## 2021-10-21 MED ORDER — BENZONATATE 100 MG PO CAPS
100.0000 mg | ORAL_CAPSULE | Freq: Three times a day (TID) | ORAL | 0 refills | Status: DC | PRN
  Filled 2021-10-21: qty 30, 10d supply, fill #0

## 2021-10-21 NOTE — Progress Notes (Signed)
Virtual Visit Consent   Kayla Campos, you are scheduled for a virtual visit with a [facility] provider today.     Just as with appointments in the office, your consent must be obtained to participate.  Your consent will be active for this visit and any virtual visit you may have with one of our providers in the next 365 days.     If you have a MyChart account, a copy of this consent can be sent to you electronically.  All virtual visits are billed to your insurance company just like a traditional visit in the office.    As this is a virtual visit, video technology does not allow for your provider to perform a traditional examination.  This may limit your provider's ability to fully assess your condition.  If your provider identifies any concerns that need to be evaluated in person or the need to arrange testing (such as labs, EKG, etc.), we will make arrangements to do so.     Although advances in technology are sophisticated, we cannot ensure that it will always work on either your end or our end.  If the connection with a video visit is poor, the visit may have to be switched to a telephone visit.  With either a video or telephone visit, we are not always able to ensure that we have a secure connection.     I need to obtain your verbal consent now.   Are you willing to proceed with your visit today?    Kayla Campos has provided verbal consent on 10/21/2021 for a virtual visit (video or telephone).   Leeanne Rio, [place]   Date: 10/21/2021 10:00 AM   Virtual Visit via Video Note   I, Leeanne Rio, connected with  Kayla Campos  ([id number], 08/19/1986) on 10/21/21 at 10:20 AM EST by a video-enabled telemedicine application and verified that I am speaking with the correct person using two identifiers.  Location: Patient: Virtual Visit Location Patient: Home Provider: Virtual Visit Location Provider: Home Office   I discussed the limitations of  evaluation and management by telemedicine and the availability of in person appointments. The patient expressed understanding and agreed to proceed.    History of Present Illness: Kayla Campos is a 35 y.o. who identifies as a female who was assigned female at birth, and is being seen today for nasal congestion, sinus pressure, sinus pain with bad taste in the mouth. Symptoms starting yesterday morning. Noted a low-grade fever last night only. Denies any aches, chills. Denies recent travel. Denies any known sick contact at home. 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 Has not had a home COVID test.   HPI: HPI  Problems:  Patient Active Problem List   Diagnosis Date Noted   Elevated CK 08/17/2021   Influenza A 02/26/2021   Anxiety 10/18/2020   Elevated LDL cholesterol level 07/30/2020   Vitiligo 07/30/2020   Allergic conjunctivitis 01/03/2020   Mild persistent asthma 01/03/2020   Food allergy 01/03/2020   Binge-eating disorder, mild 12/12/2019   Perennial and seasonal allergic rhinitis 12/12/2019   Wheezing 12/12/2019   Hematuria 02/26/2016   Stress 10/29/2015   Abnormal weight gain 10/29/2015   Hair loss 10/29/2015   Class 3 severe obesity due to excess calories without serious comorbidity with body mass index (BMI) of 40.0 to 44.9 in adult ([place]) 10/29/2015   BMI 36.0-36.9,adult 12/11/2014   Hyperpigmentation of skin of cheek 12/11/2014   Fatty liver disease,  nonalcoholic 34/01/5247   Bloating 05/25/2014   Gastroesophageal reflux disease without esophagitis 05/25/2014   Nausea alone 05/25/2014   Essential hypertension, benign 05/25/2014   Keratosis pilaris 10/11/2013    Allergies:  Allergies  Allergen Reactions   Keflex [Cephalexin] Hives   Medications:  Current Outpatient Medications:    benzonatate (TESSALON) 100 MG capsule, Take 1 capsule (100 mg total) by mouth 3 (three) times daily as needed for cough., Disp: 30 capsule, Rfl: 0   fluticasone (FLONASE) 50 MCG/ACT nasal  spray, Place 2 sprays into both nostrils daily., Disp: 16 g, Rfl: 0   Biotin 10000 MCG TABS, Take by mouth., Disp: , Rfl:    cholecalciferol (VITAMIN D3) 25 MCG (1000 UNIT) tablet, Take 1,000 Units by mouth daily., Disp: , Rfl:    clonazePAM (KLONOPIN) 0.5 MG tablet, Take 1 tablet (0.5 mg total) by mouth 2 (two) times daily as needed for anxiety., Disp: 20 tablet, Rfl: 0   Insulin Pen Needle 31G X 6 MM MISC, Use to inject once daily, Disp: 100 each, Rfl: 0   levocetirizine (XYZAL) 5 MG tablet, Take 1 tablet (5 mg total) by mouth daily as needed for allergies, Disp: 90 tablet, Rfl: 2   levonorgestrel-ethinyl estradiol (ALESSE) 0.1-20 MG-MCG tablet, Take 1 tablet by mouth daily., Disp: 84 tablet, Rfl: 4   montelukast (SINGULAIR) 10 MG tablet, Take 1 tablet (10 mg total) by mouth at bedtime., Disp: 90 tablet, Rfl: 3   WEGOVY 2.4 MG/0.75ML SOAJ, Inject 2.4 mg into the skin once a week., Disp: 9 mL, Rfl: 1   Zinc 100 MG TABS, Take by mouth., Disp: , Rfl:   Observations/Objective: Patient is well-developed, well-nourished in no acute distress.  Resting comfortably at home.  Head is normocephalic, atraumatic.  No labored breathing. Speech is clear and coherent with logical content.  Patient is alert and oriented at baseline.   Assessment and Plan: 1. Viral sinusitis - fluticasone (FLONASE) 50 MCG/ACT nasal spray; Place 2 sprays into both nostrils daily.  Dispense: 16 g; Refill: 0 - benzonatate (TESSALON) 100 MG capsule; Take 1 capsule (100 mg total) by mouth 3 (three) times daily as needed for cough.  Dispense: 30 capsule; Refill: 0 She is to take a home COVID test. If positive she is to let us know ASAP so we can adjust treatment. For now will treated for suspected viral sinusitis. Supportive measures, OTC medications reviewed. Rx Flonase and Tessalon.   Follow Up Instructions: I discussed the assessment and treatment plan with the patient. The patient was provided an opportunity to ask questions  and all were answered. The patient agreed with the plan and demonstrated an understanding of the instructions.  A copy of instructions were sent to the patient via MyChart unless otherwise noted below.   The patient was advised to call back or seek an in-person evaluation if the symptoms worsen or if the condition fails to improve as anticipated.  Time:  I spent 12 minutes with the patient via telehealth technology discussing the above problems/concerns.    Leeanne Rio, PA-C

## 2021-10-21 NOTE — Patient Instructions (Signed)
Kayla Campos, thank you for joining Leeanne Rio, PA-C for today's virtual visit.  While this provider is not your primary care provider (PCP), if your PCP is located in our provider database this encounter information will be shared with them immediately following your visit.  Consent: (Patient) Kayla Campos provided verbal consent for this virtual visit at the beginning of the encounter.  Current Medications:  Current Outpatient Medications:    Biotin 10000 MCG TABS, Take by mouth., Disp: , Rfl:    cholecalciferol (VITAMIN D3) 25 MCG (1000 UNIT) tablet, Take 1,000 Units by mouth daily., Disp: , Rfl:    clonazePAM (KLONOPIN) 0.5 MG tablet, Take 1 tablet (0.5 mg total) by mouth 2 (two) times daily as needed for anxiety., Disp: 20 tablet, Rfl: 0   escitalopram (LEXAPRO) 10 MG tablet, Take 1 tablet (10 mg total) by mouth daily., Disp: 90 tablet, Rfl: 1   ibuprofen (ADVIL) 800 MG tablet, Take 1 tablet (800 mg total) by mouth every 8 (eight) hours as needed., Disp: 30 tablet, Rfl: 0   Insulin Pen Needle 31G X 6 MM MISC, Use to inject once daily, Disp: 100 each, Rfl: 0   levocetirizine (XYZAL) 5 MG tablet, Take 1 tablet (5 mg total) by mouth daily as needed for allergies, Disp: 90 tablet, Rfl: 2   levonorgestrel-ethinyl estradiol (ALESSE) 0.1-20 MG-MCG tablet, Take 1 tablet by mouth daily., Disp: 84 tablet, Rfl: 4   Liraglutide -Weight Management (SAXENDA) 18 MG/3ML SOPN, Inject 3 mg into the skin daily. 0.6 mg inj subcut daily for 1 week, then incr by 0.6 mg weekly until reaching 3 mg injected subcut daily, Disp: 15 mL, Rfl: 0   montelukast (SINGULAIR) 10 MG tablet, Take 1 tablet (10 mg total) by mouth at bedtime., Disp: 90 tablet, Rfl: 3   sulfamethoxazole-trimethoprim (BACTRIM DS) 800-160 MG tablet, Take 1 tablet by mouth 2 (two) times daily., Disp: 14 tablet, Rfl: 0   WEGOVY 2.4 MG/0.75ML SOAJ, Inject 2.4 mg into the skin once a week., Disp: 9 mL, Rfl: 1   Zinc 100  MG TABS, Take by mouth., Disp: , Rfl:    Medications ordered in this encounter:  No orders of the defined types were placed in this encounter.    *If you need refills on other medications prior to your next appointment, please contact your pharmacy*  Follow-Up: Call back or seek an in-person evaluation if the symptoms worsen or if the condition fails to improve as anticipated.  Other Instructions Please take the home COVID test as directed. If positive, let me know ASAP. Increase fluids and get plenty of rest.  Start a saline nasal rinse. Flonase per orders.  You can use the Tessalon as directed for cough along with OTC Mucinex-DM. If you have a humidifier, run in the bedroom at night.  Stay home and rest until fever free for at least 24 hours and feeling better.    If you have been instructed to have an in-person evaluation today at a local Urgent Care facility, please use the link below. It will take you to a list of all of our available [facility], including address, phone number and hours of operation. Please do not delay care.  [facility]  If you or a family member do not have a primary care provider, use the link below to schedule a visit and establish care. When you choose a  primary care physician or advanced practice provider, you gain a  long-term partner in health. Find a Primary Care Provider  Learn more about [facility]'s in-office and virtual care options: [facility]

## 2021-10-28 ENCOUNTER — Other Ambulatory Visit (HOSPITAL_COMMUNITY): Payer: Self-pay

## 2021-11-07 ENCOUNTER — Ambulatory Visit: Payer: No Typology Code available for payment source | Admitting: Physician Assistant

## 2021-12-23 ENCOUNTER — Ambulatory Visit (INDEPENDENT_AMBULATORY_CARE_PROVIDER_SITE_OTHER): Payer: No Typology Code available for payment source | Admitting: Physician Assistant

## 2021-12-23 ENCOUNTER — Encounter: Payer: Self-pay | Admitting: Physician Assistant

## 2021-12-23 ENCOUNTER — Other Ambulatory Visit (HOSPITAL_COMMUNITY): Payer: Self-pay

## 2021-12-23 ENCOUNTER — Other Ambulatory Visit: Payer: Self-pay

## 2021-12-23 VITALS — BP 123/83 | HR 78 | Ht 66.0 in | Wt 236.0 lb

## 2021-12-23 DIAGNOSIS — L820 Inflamed seborrheic keratosis: Secondary | ICD-10-CM

## 2021-12-23 DIAGNOSIS — Z6838 Body mass index (BMI) 38.0-38.9, adult: Secondary | ICD-10-CM

## 2021-12-23 DIAGNOSIS — E6609 Other obesity due to excess calories: Secondary | ICD-10-CM | POA: Diagnosis not present

## 2021-12-23 DIAGNOSIS — M25561 Pain in right knee: Secondary | ICD-10-CM | POA: Diagnosis not present

## 2021-12-23 DIAGNOSIS — G8929 Other chronic pain: Secondary | ICD-10-CM

## 2021-12-23 MED ORDER — WEGOVY 2.4 MG/0.75ML ~~LOC~~ SOAJ
2.4000 mg | SUBCUTANEOUS | 1 refills | Status: DC
  Filled 2021-12-23: qty 9, 84d supply, fill #0
  Filled 2022-03-17 – 2022-03-20 (×2): qty 9, 84d supply, fill #1

## 2021-12-23 MED ORDER — DICLOFENAC SODIUM 1 % EX GEL
4.0000 g | Freq: Four times a day (QID) | CUTANEOUS | 0 refills | Status: DC
  Filled 2021-12-23: qty 100, 6d supply, fill #0

## 2021-12-23 NOTE — Patient Instructions (Signed)
Patellofemoral Pain Syndrome Patellofemoral pain syndrome is a condition in which the tissue (cartilage) on the underside of the kneecap (patella) softens or breaks down. This causes pain in the front of the knee. The condition is also called runner's knee or chondromalacia patella. Patellofemoral pain syndrome is most common in young adults who are active in sports. The knee is the largest joint in the body. The patella covers the front of the knee and is attached to muscles above and below the knee. The underside of the patella is covered with a smooth type of cartilage (synovium). The smooth surface helps the patella glide easily when you move your knee. Patellofemoral pain syndrome causes swelling in the joint linings and bone surfaces in the knee. What are the causes? This condition may be caused by: Overuse of the knee. Poor alignment of your knee joints. Weak leg muscles. A direct hit to your kneecap. What increases the risk? You are more likely to develop this condition if: You do a lot of activities that can wear down your kneecap. These include: Running. Squatting. Climbing stairs. You start a new physical activity or exercise program. You wear shoes that do not fit well. You do not have good leg strength. You are overweight. What are the signs or symptoms? The main symptom of this condition is knee pain. This may feel like a dull, aching pain underneath your patella, in the front of your knee. There may be a popping or cracking sound when you move your knee. Pain may get worse with: Exercise. Climbing stairs. Running. Jumping. Squatting. Kneeling. Sitting for a long time. Moving or pushing on your patella. How is this diagnosed? This condition may be diagnosed based on: Your symptoms and medical history. You may be asked about your recent physical activities and which ones cause knee pain. A physical exam. This may include: Moving your patella back and forth. Checking  your range of knee motion. Having you squat or jump to see if you have pain. Checking the strength of your leg muscles. Imaging tests to confirm the diagnosis. These may include an MRI of your knee. How is this treated? This condition may be treated at home with rest, ice, compression, and elevation (RICE).  Other treatments may include: NSAIDs, such as ibuprofen. Physical therapy to stretch and strengthen your leg muscles. Shoe inserts (orthotics) to take stress off your knee. A knee brace or knee support. Adhesive tapes to the skin. Surgery to remove damaged cartilage or move the patella to a better position. This is rare. Follow these instructions at home: If you have a brace: Wear the brace as told by your health care provider. Remove it only as told by your health care provider. Loosen the brace if your toes tingle, become numb, or turn cold and blue. Keep the brace clean. If the brace is not waterproof: Do not let it get wet. Cover it with a watertight covering when you take a bath or a shower. Managing pain, stiffness, and swelling  If directed, put ice on the painful area. To do this: If you have a removable brace, remove it as told by your health care provider. Put ice in a plastic bag. Place a towel between your skin and the bag. Leave the ice on for 20 minutes, 2-3 times a day. Remove the ice if your skin turns bright red. This is very important. If you cannot feel pain, heat, or cold, you have a greater risk of damage to the area. Move   your toes often to reduce stiffness and swelling. Raise (elevate) the injured area above the level of your heart while you are sitting or lying down. Activity Rest your knee. Avoid activities that cause knee pain. Perform stretching and strengthening exercises as told by your health care provider or physical therapist. Return to your normal activities as told by your health care provider. Ask your health care provider what activities are  safe for you. General instructions Take over-the-counter and prescription medicines only as told by your health care provider. Use splints, braces, knee supports, or walking aids as directed by your health care provider. Do not use any products that contain nicotine or tobacco, such as cigarettes, e-cigarettes, and chewing tobacco. These can delay healing. If you need help quitting, ask your health care provider. Keep all follow-up visits. This is important. Contact a health care provider if: Your symptoms get worse. You are not improving with home care. Summary Patellofemoral pain syndrome is a condition in which the tissue (cartilage) on the underside of the kneecap (patella) softens or breaks down. This condition causes swelling in the joint linings and bone surfaces in the knee. This leads to pain in the front of the knee. This condition may be treated at home with rest, ice, compression, and elevation (RICE). Use splints, braces, knee supports, or walking aids as directed by your health care provider. This information is not intended to replace advice given to you by your health care provider. Make sure you discuss any questions you have with your health care provider. Document Revised: 04/10/2020 Document Reviewed: 04/10/2020 Elsevier Patient Education  2022 [employer]vier Inc.  

## 2021-12-23 NOTE — Progress Notes (Signed)
Subjective:    Patient ID: Kayla Campos, female    DOB: 1985/12/19, 36 y.o.   MRN: [id number]  HPI Pt is a 36 yo obese female who presents to the clinic to folllow up on weight loss.   She has been on wegovy since September and lost 36lbs.  Denies any side effects. She is eating better but has not been able to exercise for the last 3 months due to intermittent right knee pain. She denies any injury. Worse when going up stairs, squatting, running. Not tried anything to make better.   She does have a itchy spot on scalp that she wants looked at. Her brush does cause some irritation in this area. Not bleeding.   .. Active Ambulatory Problems    Diagnosis Date Noted   Keratosis pilaris 10/11/2013   Bloating 05/25/2014   Gastroesophageal reflux disease without esophagitis 05/25/2014   Nausea alone 05/25/2014   Essential hypertension, benign 05/25/2014   Fatty liver disease, nonalcoholic 76/73/4193   BMI 36.0-36.9,adult 12/11/2014   Hyperpigmentation of skin of cheek 12/11/2014   Stress 10/29/2015   Abnormal weight gain 10/29/2015   Hair loss 10/29/2015   Class 2 obesity due to excess calories without serious comorbidity with body mass index (BMI) of 38.0 to 38.9 in adult 10/29/2015   Hematuria 02/26/2016   Binge-eating disorder, mild 12/12/2019   Perennial and seasonal allergic rhinitis 12/12/2019   Wheezing 12/12/2019   Allergic conjunctivitis 01/03/2020   Mild persistent asthma 01/03/2020   Food allergy 01/03/2020   Elevated LDL cholesterol level 07/30/2020   Vitiligo 07/30/2020   Anxiety 10/18/2020   Influenza A 02/26/2021   Elevated CK 08/17/2021   Seborrheic keratoses, inflamed 12/23/2021   Resolved Ambulatory Problems    Diagnosis Date Noted   BMI 40.0-44.9, adult ([place]) 10/11/2013   Past Medical History:  Diagnosis Date   Alopecia    Asthma    Eczema    Hypertension    Insulin resistance    Pleurisy    PNA (pneumonia)    Urticaria       Review  of Systems See HPI.     Objective:   Physical Exam Vitals reviewed.  Constitutional:      Appearance: Normal appearance. She is obese.  HENT:     Head: Normocephalic.  Cardiovascular:     Rate and Rhythm: Normal rate and regular rhythm.  Pulmonary:     Effort: Pulmonary effort is normal.  Abdominal:     General: There is no distension.     Palpations: Abdomen is soft.  Musculoskeletal:     Right lower leg: No edema.     Left lower leg: No edema.     Comments: Normal ROM of bilateral knees No tenderness to palpation No laxity Negative mcmurrays 5/5 lower ext strength  Skin:    Comments: Right temple just under hairline irritated wart like non pigmented raised papule about 57mm by 81mm.   Neurological:     General: No focal deficit present.     Mental Status: She is alert.  Psychiatric:        Mood and Affect: Mood normal.   Cryotherapy Procedure Note  Pre-operative Diagnosis: inflamed SK  Post-operative Diagnosis: inflamed SK  Locations: right temple just under hairline  Indications: inflamed  Procedure Details  History of allergy to iodine: no. Pacemaker? no.  Patient informed of risks (permanent scarring, infection, light or dark discoloration, bleeding, infection, weakness, numbness and recurrence of the lesion) and benefits of the  procedure and verbal informed consent obtained.  The areas are treated with liquid nitrogen therapy, frozen until ice ball extended 2 mm beyond lesion, allowed to thaw, and treated again. The patient tolerated procedure well.  The patient was instructed on post-op care, warned that there may be blister formation, redness and pain. Recommend OTC analgesia as needed for pain.  Condition: Stable  Complications: none.  Plan: 1. Instructed to keep the area dry and covered for 24-48h and clean thereafter. 2. Warning signs of infection were reviewed.   3. Recommended that the patient use OTC acetaminophen as needed for pain.          Assessment & Plan:  .Marland KitchenEudell was seen today for weight check.  Diagnoses and all orders for this visit:  Class 2 obesity due to excess calories without serious comorbidity with body mass index (BMI) of 38.0 to 38.9 in adult -     WEGOVY 2.4 MG/0.75ML SOAJ; Inject 2.4 mg into the skin once a week.  Chronic pain of right knee -     diclofenac Sodium (VOLTAREN) 1 % GEL; Apply 4 gm topically 4 (four) times daily to affected joint.  Seborrheic keratoses, inflamed   Down 36lbs doing great.  Goal weight is BMI under 30.  Need to start back walking Voltaren gel for right knee Suspect patellofemoral syndrome Get knee strap Ice as needed Cryotherapy for inflammed SK, reassurance given.

## 2022-01-24 IMAGING — DX DG CHEST 2V
2 series · 2 of 2 positions shown · non-contrast
Comparison: Chest x-ray 01/17/2020.

CLINICAL DATA: 34-year-old female with history of fever, chills and
cough.

EXAM:
CHEST - 2 VIEW

[chest pa]
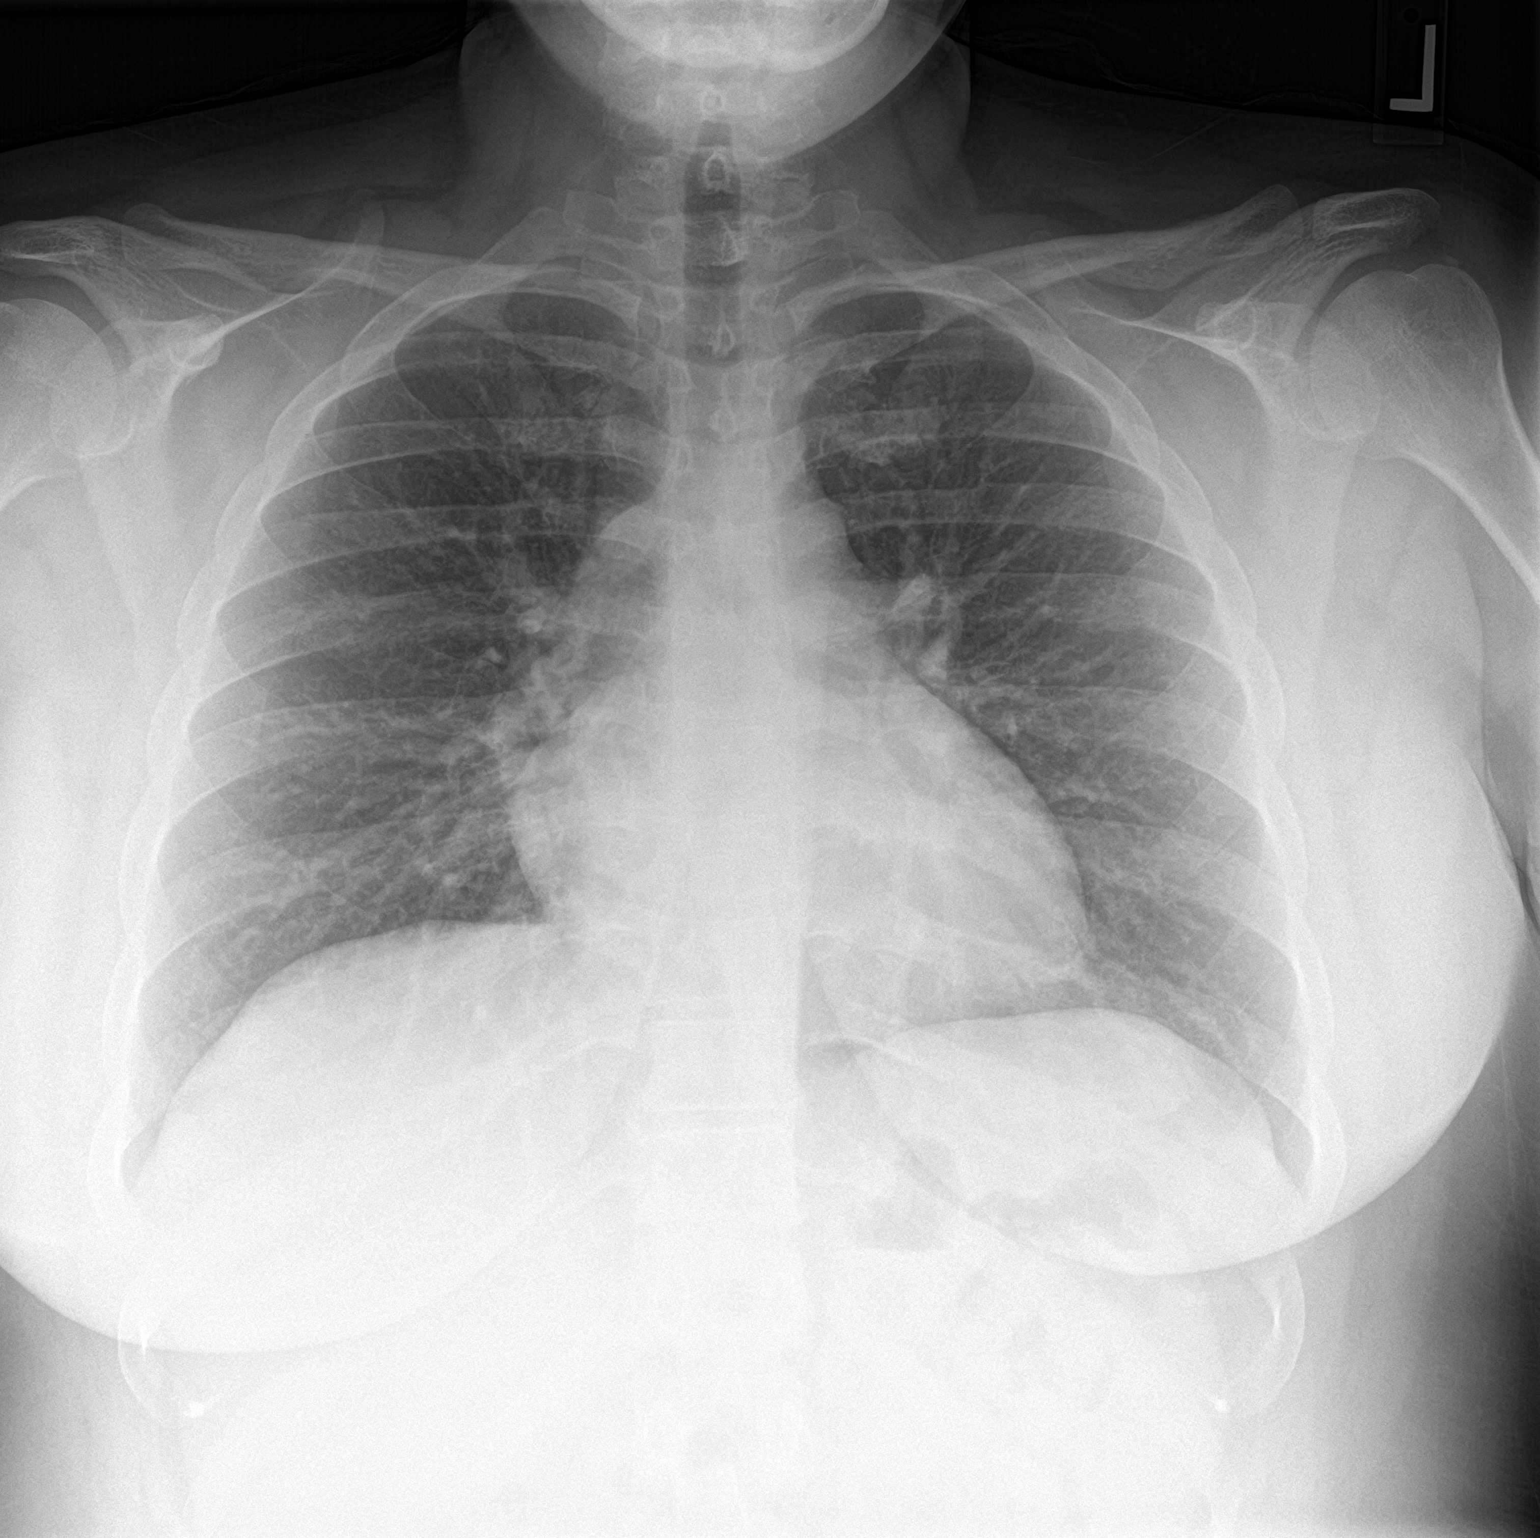

[chest lat]
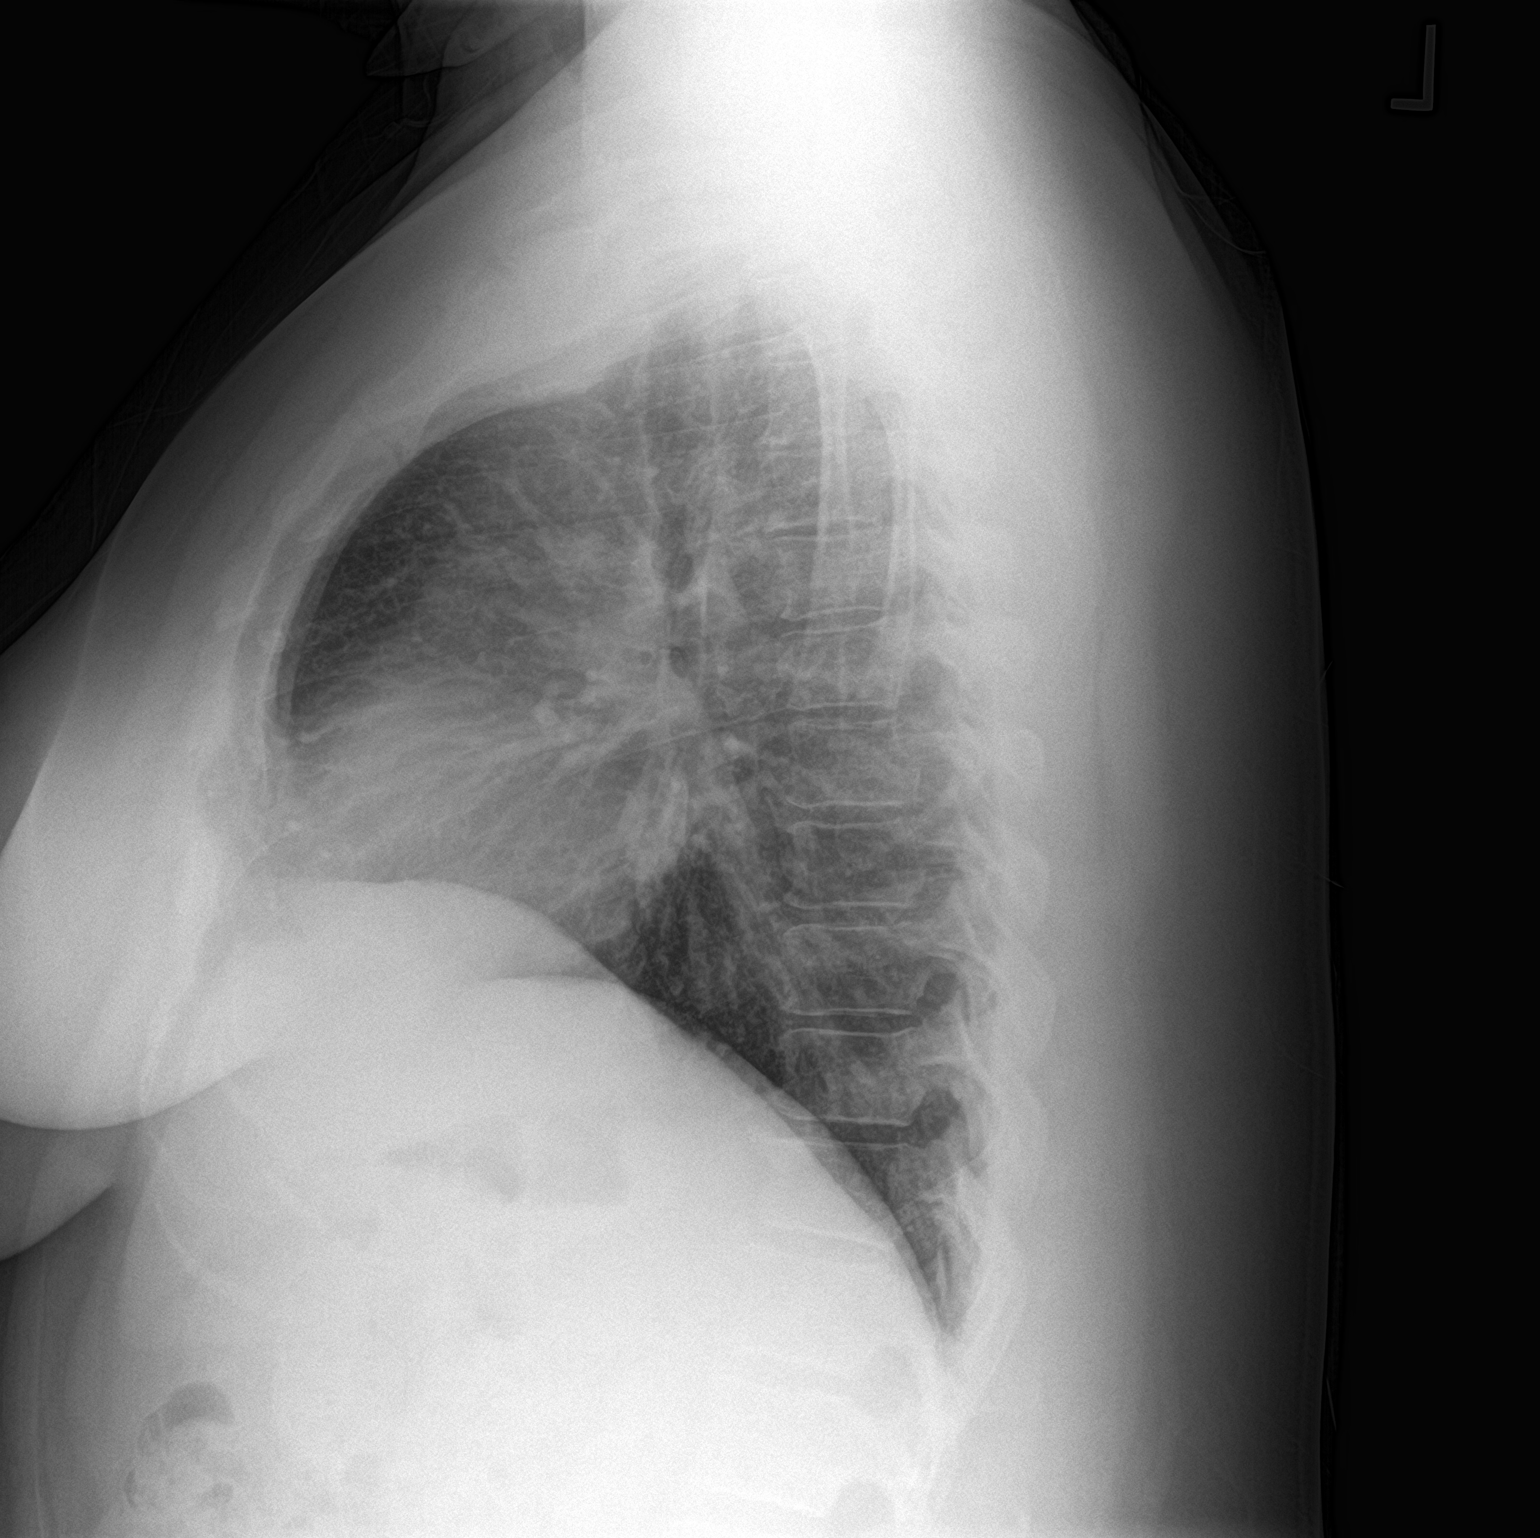

[2 of 2 positions shown; findings below may reference images not displayed]

FINDINGS: Lung volumes are normal. No consolidative airspace disease. No
pleural effusions. No pneumothorax. No pulmonary nodule or mass
noted. Pulmonary vasculature and the cardiomediastinal silhouette
are within normal limits.
IMPRESSION: No radiographic evidence of acute cardiopulmonary disease.

## 2022-02-23 ENCOUNTER — Encounter: Payer: Self-pay | Admitting: Physician Assistant

## 2022-02-23 ENCOUNTER — Other Ambulatory Visit (HOSPITAL_COMMUNITY): Payer: Self-pay

## 2022-02-23 MED ORDER — FLUCONAZOLE 150 MG PO TABS
150.0000 mg | ORAL_TABLET | ORAL | 1 refills | Status: DC
  Filled 2022-02-23: qty 2, 3d supply, fill #0
  Filled 2022-05-24: qty 2, 3d supply, fill #1

## 2022-03-17 ENCOUNTER — Other Ambulatory Visit (HOSPITAL_COMMUNITY): Payer: Self-pay

## 2022-03-17 ENCOUNTER — Other Ambulatory Visit: Payer: Self-pay | Admitting: Physician Assistant

## 2022-03-17 MED ORDER — LEVOCETIRIZINE DIHYDROCHLORIDE 5 MG PO TABS
5.0000 mg | ORAL_TABLET | Freq: Every day | ORAL | 2 refills | Status: DC | PRN
  Filled 2022-03-17: qty 90, 90d supply, fill #0
  Filled 2022-06-16: qty 90, 90d supply, fill #1
  Filled 2022-09-21: qty 90, 90d supply, fill #2

## 2022-03-18 ENCOUNTER — Other Ambulatory Visit (HOSPITAL_COMMUNITY): Payer: Self-pay

## 2022-03-18 ENCOUNTER — Telehealth: Payer: Self-pay

## 2022-03-18 NOTE — Telephone Encounter (Addendum)
Initiated Prior authorization IYM:EBRAXE 2.'4MG'$ /0.75ML auto-injectors ?Via: Covermymeds ?Case/Key: [id number] ?Status: approved  as of 03/18/22 ?Reason:The authorization is effective for a maximum of 12 fills from 03/20/2022 to 03/20/2023, as long as the member is enrolled in their current health plan. The request was approved as submitted. This request has been approved for 50m (4 of the 2.'4mg'$ /0.733mpen injectors) per 28 day.An additional authorization [id number] been approved for [facility]25mg'$ /0.83m43mith 12 fills. This request is effective from 03/20/2022 through 03/22/2023, and is limited to 2mL24m of the 0.'25mg'$ /0.83mL 183m injectors) per 28 days.An additional authorization [id number] approved for [facility]5mg'$ /0.83mL w78m 12 fills. This request is effective from 03/20/2022 through 03/22/2023, and is limited to 2mL (429m the 0.'5mg'$ /0.83mL pen49mjectors) per 28 days.An additional authorization [id number] approved for Wegovy $[facility]'1mg'$ /0.83mL with96m fills. This request is effective from 03/20/2022 through 03/22/2023, and is limited to 2mL (4 of44me '1mg'$ /0.83mL pen in59mtors) per 28 days.An additional authorization 7438 has be9292pproved for Wegovy 1.'7mg'$ /0.783mL with 1283mlls. This request is effective from 03/20/2022 through 03/22/2023, and is limited to 3mL (4 of th74m.'7mg'$ /0.783mL pen inje59ms) per 28 days.  ?Notified Pt via: Mychart  ?

## 2022-03-20 ENCOUNTER — Other Ambulatory Visit (HOSPITAL_COMMUNITY): Payer: Self-pay

## 2022-03-24 ENCOUNTER — Other Ambulatory Visit (HOSPITAL_COMMUNITY): Payer: Self-pay

## 2022-03-24 ENCOUNTER — Ambulatory Visit (INDEPENDENT_AMBULATORY_CARE_PROVIDER_SITE_OTHER): Payer: No Typology Code available for payment source | Admitting: Physician Assistant

## 2022-03-24 VITALS — BP 121/73 | HR 79 | Temp 98.3°F | Ht 66.0 in | Wt 210.0 lb

## 2022-03-24 DIAGNOSIS — E6609 Other obesity due to excess calories: Secondary | ICD-10-CM

## 2022-03-24 DIAGNOSIS — Z6833 Body mass index (BMI) 33.0-33.9, adult: Secondary | ICD-10-CM

## 2022-03-24 MED ORDER — OMEPRAZOLE 40 MG PO CPDR
40.0000 mg | DELAYED_RELEASE_CAPSULE | Freq: Every day | ORAL | 3 refills | Status: DC
  Filled 2022-03-24: qty 90, 90d supply, fill #0
  Filled 2022-06-16: qty 90, 90d supply, fill #1
  Filled 2022-09-21: qty 90, 90d supply, fill #2
  Filled 2022-12-15: qty 90, 90d supply, fill #3

## 2022-03-24 NOTE — Progress Notes (Signed)
Ok to try PPI due to wegovy side effects.  ?

## 2022-03-24 NOTE — Progress Notes (Signed)
Patient is here for weight & blood pressure check. Denies trouble sleeping, palpitations, dizziness, lightheadedness, blurry vision, chest pain, shortness of breath, headaches and/or medication problems. Patient mentioned that she is having a lot of episodes of belching and flatulence after taking the [facility] rx. She is currently taking OTC Gas-x for symptoms (4/tabs daily). Requesting a rx for Prilosec. Rx pended.  ? ?Patient has lost weight. Per patient, she is does not need a refill at this time for [facility] rx. Patient informed to schedule next nurse visit appointment in 90 days.  ?

## 2022-03-31 LAB — HEMOGLOBIN, FINGERSTICK: Hemoglobin, fingerstick: 12

## 2022-04-01 ENCOUNTER — Other Ambulatory Visit (HOSPITAL_COMMUNITY): Payer: Self-pay

## 2022-04-01 LAB — HIV ANTIBODY: HIV: NONREACTIVE

## 2022-04-01 LAB — HEMOGLOBIN A1C: Hemoglobin-A1c: 5.2

## 2022-04-01 LAB — TRICHOMONAS SCREEN: Trichomonas: NEGATIVE

## 2022-04-01 LAB — HEPATITIS B SURFACE ANTIGEN: HBsAg Screen: NEGATIVE

## 2022-04-01 LAB — GONORRHEA SCREEN: Neisseria Gonorrhea: NEGATIVE

## 2022-04-01 LAB — INSULIN, RANDOM: INSULIN: 46.5

## 2022-04-01 LAB — CHLAMYDIA CULTURE: Chlamydia by NAA: NEGATIVE

## 2022-04-01 LAB — HCV ANTIBODY CASCADE(PCR/GENO): HCV Antibody Verification: NONREACTIVE

## 2022-04-01 LAB — RPR: RPR: NONREACTIVE

## 2022-04-01 MED ORDER — LEVONORGESTREL-ETHINYL ESTRAD 0.1-20 MG-MCG PO TABS
1.0000 | ORAL_TABLET | Freq: Every day | ORAL | 3 refills | Status: DC
  Filled 2022-04-01 – 2022-04-30 (×2): qty 84, 84d supply, fill #0
  Filled 2022-07-22: qty 84, 84d supply, fill #1
  Filled 2022-11-06 (×2): qty 84, 84d supply, fill #2

## 2022-04-30 ENCOUNTER — Other Ambulatory Visit (HOSPITAL_COMMUNITY): Payer: Self-pay

## 2022-05-04 ENCOUNTER — Other Ambulatory Visit (HOSPITAL_COMMUNITY): Payer: Self-pay

## 2022-05-25 ENCOUNTER — Other Ambulatory Visit (HOSPITAL_COMMUNITY): Payer: Self-pay

## 2022-06-01 ENCOUNTER — Other Ambulatory Visit: Payer: Self-pay | Admitting: Physician Assistant

## 2022-06-01 ENCOUNTER — Other Ambulatory Visit (HOSPITAL_COMMUNITY): Payer: Self-pay

## 2022-06-01 DIAGNOSIS — E6609 Other obesity due to excess calories: Secondary | ICD-10-CM

## 2022-06-09 ENCOUNTER — Other Ambulatory Visit (HOSPITAL_COMMUNITY): Payer: Self-pay

## 2022-06-09 ENCOUNTER — Ambulatory Visit (INDEPENDENT_AMBULATORY_CARE_PROVIDER_SITE_OTHER): Payer: No Typology Code available for payment source | Admitting: Medical-Surgical

## 2022-06-09 DIAGNOSIS — Z6838 Body mass index (BMI) 38.0-38.9, adult: Secondary | ICD-10-CM | POA: Diagnosis not present

## 2022-06-09 DIAGNOSIS — E6609 Other obesity due to excess calories: Secondary | ICD-10-CM | POA: Diagnosis not present

## 2022-06-09 MED ORDER — WEGOVY 2.4 MG/0.75ML ~~LOC~~ SOAJ
2.4000 mg | SUBCUTANEOUS | 1 refills | Status: DC
  Filled 2022-06-09: qty 3, 28d supply, fill #0
  Filled 2022-06-09: qty 9, 84d supply, fill #0

## 2022-06-09 NOTE — Addendum Note (Signed)
Addended bySamuel Bouche on: 06/09/2022 02:58 PM   Modules accepted: Level of Service

## 2022-06-09 NOTE — Progress Notes (Signed)
Patient doing well on appetite suppressant. Here for nurse visit, weight, BP, HR check. Denies problems with insomnia, heart palpitations or tremors.    Patient feels that the [facility] is no longer working for her. She isn't seeing the results that she once was and would like to be switched to [facility]. She states that she has insulin resistance and believes that the [facility] would be helpful to help her to loose the additional weight. Pt also mentioned that she has an appointment scheduled with Endo to discuss this also.   Pt was advised that her pcp was not here this week and that would need to be something that she would have to discuss with her.

## 2022-06-09 NOTE — Progress Notes (Signed)
Agree with documentation as below.  ___________________________________________ Ules Marsala L. Ronne Stefanski, DNP, APRN, FNP-BC Primary Care and Sports Medicine [facility]  

## 2022-06-10 ENCOUNTER — Encounter: Payer: Self-pay | Admitting: Neurology

## 2022-06-16 ENCOUNTER — Telehealth: Payer: No Typology Code available for payment source | Admitting: Family Medicine

## 2022-06-16 ENCOUNTER — Other Ambulatory Visit (HOSPITAL_COMMUNITY): Payer: Self-pay

## 2022-06-16 DIAGNOSIS — J45901 Unspecified asthma with (acute) exacerbation: Secondary | ICD-10-CM | POA: Diagnosis not present

## 2022-06-16 DIAGNOSIS — J208 Acute bronchitis due to other specified organisms: Secondary | ICD-10-CM | POA: Diagnosis not present

## 2022-06-16 MED ORDER — PROMETHAZINE-DM 6.25-15 MG/5ML PO SYRP
5.0000 mL | ORAL_SOLUTION | Freq: Four times a day (QID) | ORAL | 0 refills | Status: DC | PRN
  Filled 2022-06-16: qty 118, 6d supply, fill #0

## 2022-06-16 MED ORDER — PREDNISONE 20 MG PO TABS
40.0000 mg | ORAL_TABLET | Freq: Every day | ORAL | 0 refills | Status: AC
  Filled 2022-06-16: qty 10, 5d supply, fill #0

## 2022-06-16 MED ORDER — ALBUTEROL SULFATE HFA 108 (90 BASE) MCG/ACT IN AERS
2.0000 | INHALATION_SPRAY | Freq: Four times a day (QID) | RESPIRATORY_TRACT | 0 refills | Status: DC | PRN
  Filled 2022-06-16: qty 6.7, 25d supply, fill #0

## 2022-06-16 MED ORDER — AZITHROMYCIN 250 MG PO TABS
ORAL_TABLET | ORAL | 0 refills | Status: AC
  Filled 2022-06-16: qty 6, 5d supply, fill #0

## 2022-06-16 NOTE — Patient Instructions (Addendum)
Kayla Campos, thank you for joining Perlie Mayo, NP for today's virtual visit.  While this provider is not your primary care provider (PCP), if your PCP is located in our provider database this encounter information will be shared with them immediately following your visit.  Consent: (Patient) Kayla Campos provided verbal consent for this virtual visit at the beginning of the encounter.  Current Medications:  Current Outpatient Medications:    albuterol (VENTOLIN HFA) 108 (90 Base) MCG/ACT inhaler, Inhale 2 puffs into the lungs every 6 (six) hours as needed for wheezing or shortness of breath., Disp: 8 g, Rfl: 0   azithromycin (ZITHROMAX) 250 MG tablet, Take 2 tablets on day 1, then 1 tablet daily on days 2 through 5, Disp: 6 tablet, Rfl: 0   predniSONE (DELTASONE) 20 MG tablet, Take 2 tablets (40 mg total) by mouth daily with breakfast for 5 days., Disp: 10 tablet, Rfl: 0   promethazine-dextromethorphan (PROMETHAZINE-DM) 6.25-15 MG/5ML syrup, Take 5 mLs by mouth 4 (four) times daily as needed for cough., Disp: 118 mL, Rfl: 0   Biotin 10000 MCG TABS, Take by mouth., Disp: , Rfl:    cholecalciferol (VITAMIN D3) 25 MCG (1000 UNIT) tablet, Take 1,000 Units by mouth daily., Disp: , Rfl:    levocetirizine (XYZAL) 5 MG tablet, Take 1 tablet (5 mg total) by mouth daily as needed for allergies, Disp: 90 tablet, Rfl: 2   levonorgestrel-ethinyl estradiol (AVIANE) 0.1-20 MG-MCG tablet, Take 1 tablet by mouth daily., Disp: 84 tablet, Rfl: 3   montelukast (SINGULAIR) 10 MG tablet, Take 1 tablet (10 mg total) by mouth at bedtime., Disp: 90 tablet, Rfl: 3   omeprazole (PRILOSEC) 40 MG capsule, Take 1 capsule (40 mg total) by mouth daily., Disp: 90 capsule, Rfl: 3   WEGOVY 2.4 MG/0.75ML SOAJ, Inject 2.4 mg into the skin once a week., Disp: 9 mL, Rfl: 1   Medications ordered in this encounter:  Meds ordered this encounter  Medications   predniSONE (DELTASONE) 20 MG tablet    Sig:  Take 2 tablets (40 mg total) by mouth daily with breakfast for 5 days.    Dispense:  10 tablet    Refill:  0    Order Specific Question:   Supervising Provider    Answer:   Sabra Heck, BRIAN [[id number]]   azithromycin (ZITHROMAX) 250 MG tablet    Sig: Take 2 tablets on day 1, then 1 tablet daily on days 2 through 5    Dispense:  6 tablet    Refill:  0    Order Specific Question:   Supervising Provider    Answer:   Sabra Heck, BRIAN [[id number]]   albuterol (VENTOLIN HFA) 108 (90 Base) MCG/ACT inhaler    Sig: Inhale 2 puffs into the lungs every 6 (six) hours as needed for wheezing or shortness of breath.    Dispense:  8 g    Refill:  0    Order Specific Question:   Supervising Provider    Answer:   Sabra Heck, BRIAN [[id number]]   promethazine-dextromethorphan (PROMETHAZINE-DM) 6.25-15 MG/5ML syrup    Sig: Take 5 mLs by mouth 4 (four) times daily as needed for cough.    Dispense:  118 mL    Refill:  0    Order Specific Question:   Supervising Provider    Answer:   Sabra Heck, BRIAN [[id number]]     *If you need refills on other medications prior to your next appointment, please contact your pharmacy*  Follow-Up: Call back or  seek an in-person evaluation if the symptoms worsen or if the condition fails to improve as anticipated.  Other Instructions    If you have been instructed to have an in-person evaluation today at a local Urgent Care facility, please use the link below. It will take you to a list of all of our available [facility], including address, phone number and hours of operation. Please do not delay care.  [facility]  If you or a family member do not have a primary care provider, use the link below to schedule a visit and establish care. When you choose a [facility] primary care physician or advanced practice provider, you gain a long-term partner in health. Find a Primary Care Provider  Learn more about [facility] in-office and virtual care options: [facility]

## 2022-06-16 NOTE — Progress Notes (Signed)
Virtual Visit Consent   Kayla Campos, you are scheduled for a virtual visit with a [facility] provider today. Just as with appointments in the office, your consent must be obtained to participate. Your consent will be active for this visit and any virtual visit you may have with one of our providers in the next 365 days. If you have a MyChart account, a copy of this consent can be sent to you electronically.  As this is a virtual visit, video technology does not allow for your provider to perform a traditional examination. This may limit your provider's ability to fully assess your condition. If your provider identifies any concerns that need to be evaluated in person or the need to arrange testing (such as labs, EKG, etc.), we will make arrangements to do so. Although advances in technology are sophisticated, we cannot ensure that it will always work on either your end or our end. If the connection with a video visit is poor, the visit may have to be switched to a telephone visit. With either a video or telephone visit, we are not always able to ensure that we have a secure connection.  By engaging in this virtual visit, you consent to the provision of healthcare and authorize for your insurance to be billed (if applicable) for the services provided during this visit. Depending on your insurance coverage, you may receive a charge related to this service.  I need to obtain your verbal consent now. Are you willing to proceed with your visit today? Kayla Campos has provided verbal consent on 06/16/2022 for a virtual visit (video or telephone). Kayla Mayo, NP  Date: 06/16/2022 10:38 AM  Virtual Visit via Video Note   I, Kayla Campos, connected with  Kayla Campos  ([id number], 04/08/1986) on 06/16/22 at 10:45 AM EDT by a video-enabled telemedicine application and verified that I am speaking with the correct person using two identifiers.  Location: Patient: Virtual  Visit Location Patient: Home Provider: Virtual Visit Location Provider: Home Office   I discussed the limitations of evaluation and management by telemedicine and the availability of in person appointments. The patient expressed understanding and agreed to proceed.    History of Present Illness: Kayla Campos is a 36 y.o. who identifies as a female who was assigned female at birth, and is being seen today for coughing.  Onset was 2 weeks back. No URI or sinus symptoms prior. Did headache prior to cough starting. Has some yellow cream mucus.  Mild increase in wheezing and shortness of breath.  Denies fevers, chest pain, ear or sore throat pain. Hx of asthma, has been using rescue inhaler every night-  is having more coughing at night in early morning hours. COVID neg   Problems:  Patient Active Problem List   Diagnosis Date Noted   Seborrheic keratoses, inflamed 12/23/2021   Elevated CK 08/17/2021   Influenza A 02/26/2021   Anxiety 10/18/2020   Elevated LDL cholesterol level 07/30/2020   Vitiligo 07/30/2020   Allergic conjunctivitis 01/03/2020   Mild persistent asthma 01/03/2020   Food allergy 01/03/2020   Binge-eating disorder, mild 12/12/2019   Perennial and seasonal allergic rhinitis 12/12/2019   Wheezing 12/12/2019   Hematuria 02/26/2016   Stress 10/29/2015   Abnormal weight gain 10/29/2015   Hair loss 10/29/2015   Class 1 obesity due to excess calories without serious comorbidity with body mass index (BMI) of 33.0 to 33.9 in adult 10/29/2015   BMI 36.0-36.9,adult 12/11/2014  Hyperpigmentation of skin of cheek 12/11/2014   Fatty liver disease, nonalcoholic 94/70/9628   Bloating 05/25/2014   Gastroesophageal reflux disease without esophagitis 05/25/2014   Nausea alone 05/25/2014   Essential hypertension, benign 05/25/2014   Keratosis pilaris 10/11/2013    Allergies:  Allergies  Allergen Reactions   Keflex [Cephalexin] Hives   Medications:  Current  Outpatient Medications:    Biotin 10000 MCG TABS, Take by mouth., Disp: , Rfl:    cholecalciferol (VITAMIN D3) 25 MCG (1000 UNIT) tablet, Take 1,000 Units by mouth daily., Disp: , Rfl:    levocetirizine (XYZAL) 5 MG tablet, Take 1 tablet (5 mg total) by mouth daily as needed for allergies, Disp: 90 tablet, Rfl: 2   levonorgestrel-ethinyl estradiol (AVIANE) 0.1-20 MG-MCG tablet, Take 1 tablet by mouth daily., Disp: 84 tablet, Rfl: 3   montelukast (SINGULAIR) 10 MG tablet, Take 1 tablet (10 mg total) by mouth at bedtime., Disp: 90 tablet, Rfl: 3   omeprazole (PRILOSEC) 40 MG capsule, Take 1 capsule (40 mg total) by mouth daily., Disp: 90 capsule, Rfl: 3   WEGOVY 2.4 MG/0.75ML SOAJ, Inject 2.4 mg into the skin once a week., Disp: 9 mL, Rfl: 1  Observations/Objective: Patient is well-developed, well-nourished in no acute distress.  Resting comfortably  at home.  Head is normocephalic, atraumatic.  No labored breathing.  Speech is clear and coherent with logical content.  Patient is alert and oriented at baseline.  Cough present  Assessment and Plan: 1. Acute viral bronchitis  - predniSONE (DELTASONE) 20 MG tablet; Take 2 tablets (40 mg total) by mouth daily with breakfast for 5 days.  Dispense: 10 tablet; Refill: 0 - azithromycin (ZITHROMAX) 250 MG tablet; Take 2 tablets on day 1, then 1 tablet daily on days 2 through 5  Dispense: 6 tablet; Refill: 0 - promethazine-dextromethorphan (PROMETHAZINE-DM) 6.25-15 MG/5ML syrup; Take 5 mLs by mouth 4 (four) times daily as needed for cough.  Dispense: 118 mL; Refill: 0  2. Mild asthma exacerbation - predniSONE (DELTASONE) 20 MG tablet; Take 2 tablets (40 mg total) by mouth daily with breakfast for 5 days.  Dispense: 10 tablet; Refill: 0 - albuterol (VENTOLIN HFA) 108 (90 Base) MCG/ACT inhaler; Inhale 2 puffs into the lungs every 6 (six) hours as needed for wheezing or shortness of breath.  Dispense: 8 g; Refill: 0  Viral infection triggering  bronchitis/asthma flare Tx as above Refill of inhaler provided as well No red flags, covid neg   Reviewed side effects, risks and benefits of medication.    Patient acknowledged agreement and understanding of the plan.   Past Medical, Surgical, Social History, Allergies, and Medications have been Reviewed.    Follow Up Instructions: I discussed the assessment and treatment plan with the patient. The patient was provided an opportunity to ask questions and all were answered. The patient agreed with the plan and demonstrated an understanding of the instructions.  A copy of instructions were sent to the patient via MyChart unless otherwise noted below.     The patient was advised to call back or seek an in-person evaluation if the symptoms worsen or if the condition fails to improve as anticipated.  Time:  I spent 10 minutes with the patient via telehealth technology discussing the above problems/concerns.    Kayla Mayo, NP

## 2022-06-18 ENCOUNTER — Other Ambulatory Visit (HOSPITAL_COMMUNITY): Payer: Self-pay

## 2022-06-18 MED ORDER — METFORMIN HCL ER 500 MG PO TB24
500.0000 mg | ORAL_TABLET | Freq: Every evening | ORAL | 4 refills | Status: DC
  Filled 2022-06-18: qty 2, 2d supply, fill #0
  Filled 2022-06-18: qty 28, 28d supply, fill #0
  Filled 2022-07-13: qty 30, 30d supply, fill #1
  Filled 2022-08-11: qty 30, 30d supply, fill #2
  Filled 2022-08-11: qty 90, 90d supply, fill #2

## 2022-06-23 ENCOUNTER — Ambulatory Visit: Payer: No Typology Code available for payment source | Admitting: Physician Assistant

## 2022-07-14 ENCOUNTER — Encounter: Payer: Self-pay | Admitting: Physician Assistant

## 2022-07-14 ENCOUNTER — Other Ambulatory Visit (HOSPITAL_COMMUNITY): Payer: Self-pay

## 2022-07-15 ENCOUNTER — Other Ambulatory Visit (HOSPITAL_COMMUNITY): Payer: Self-pay

## 2022-07-15 MED ORDER — TIRZEPATIDE 2.5 MG/0.5ML ~~LOC~~ SOAJ
2.5000 mg | SUBCUTANEOUS | 0 refills | Status: DC
  Filled 2022-07-15: qty 2, 28d supply, fill #0

## 2022-07-22 ENCOUNTER — Other Ambulatory Visit (HOSPITAL_COMMUNITY): Payer: Self-pay

## 2022-08-11 ENCOUNTER — Other Ambulatory Visit (HOSPITAL_COMMUNITY): Payer: Self-pay

## 2022-08-11 ENCOUNTER — Ambulatory Visit (INDEPENDENT_AMBULATORY_CARE_PROVIDER_SITE_OTHER): Payer: No Typology Code available for payment source | Admitting: Physician Assistant

## 2022-08-11 ENCOUNTER — Encounter: Payer: Self-pay | Admitting: Physician Assistant

## 2022-08-11 VITALS — BP 119/74 | HR 88 | Ht 66.0 in | Wt 201.1 lb

## 2022-08-11 DIAGNOSIS — L089 Local infection of the skin and subcutaneous tissue, unspecified: Secondary | ICD-10-CM

## 2022-08-11 DIAGNOSIS — F439 Reaction to severe stress, unspecified: Secondary | ICD-10-CM

## 2022-08-11 DIAGNOSIS — F419 Anxiety disorder, unspecified: Secondary | ICD-10-CM

## 2022-08-11 DIAGNOSIS — F32A Depression, unspecified: Secondary | ICD-10-CM

## 2022-08-11 DIAGNOSIS — E7800 Pure hypercholesterolemia, unspecified: Secondary | ICD-10-CM

## 2022-08-11 DIAGNOSIS — Z131 Encounter for screening for diabetes mellitus: Secondary | ICD-10-CM | POA: Diagnosis not present

## 2022-08-11 DIAGNOSIS — Z79899 Other long term (current) drug therapy: Secondary | ICD-10-CM

## 2022-08-11 DIAGNOSIS — E6609 Other obesity due to excess calories: Secondary | ICD-10-CM

## 2022-08-11 DIAGNOSIS — E88819 Insulin resistance, unspecified: Secondary | ICD-10-CM

## 2022-08-11 DIAGNOSIS — S01331A Puncture wound without foreign body of right ear, initial encounter: Secondary | ICD-10-CM

## 2022-08-11 DIAGNOSIS — H93A1 Pulsatile tinnitus, right ear: Secondary | ICD-10-CM

## 2022-08-11 DIAGNOSIS — Z1329 Encounter for screening for other suspected endocrine disorder: Secondary | ICD-10-CM | POA: Diagnosis not present

## 2022-08-11 DIAGNOSIS — Z6832 Body mass index (BMI) 32.0-32.9, adult: Secondary | ICD-10-CM

## 2022-08-11 MED ORDER — TIRZEPATIDE 5 MG/0.5ML ~~LOC~~ SOAJ
5.0000 mg | SUBCUTANEOUS | 2 refills | Status: DC
  Filled 2022-08-11: qty 2, 28d supply, fill #0

## 2022-08-11 MED ORDER — DOXYCYCLINE HYCLATE 100 MG PO TABS
100.0000 mg | ORAL_TABLET | Freq: Two times a day (BID) | ORAL | 0 refills | Status: DC
  Filled 2022-08-11: qty 20, 10d supply, fill #0

## 2022-08-11 NOTE — Patient Instructions (Signed)
Will get CT. Tinnitus Tinnitus refers to hearing a sound when there is no actual source for that sound. This is often described as ringing in the ears. However, people with this condition may hear a variety of noises, in one ear or in both ears. The sounds of tinnitus can be soft, loud, or somewhere in between. Tinnitus can last for a few seconds or can be constant for days. It may go away without treatment and come back at various times. When tinnitus is constant or happens often, it can lead to other problems, such as trouble sleeping and trouble concentrating. Almost everyone experiences tinnitus at some point. Tinnitus is not the same as hearing loss. Tinnitus that is long-lasting (chronic) or comes back often (recurs) may require medical attention. What are the causes? The cause of tinnitus is often not known. In some cases, it can result from: Exposure to loud noises from machinery, music, or other sources. An object (foreign body) stuck in the ear. Earwax buildup. Drinking alcohol or caffeine. Taking certain medicines. Age-related hearing loss. It may also be caused by medical conditions such as: Ear or sinus infections. Heart diseases or high blood pressure. Allergies. Mnire's disease. Thyroid problems. Tumors. A weak, bulging blood vessel (aneurysm) near the ear. What increases the risk? The following factors may make you more likely to develop this condition: Exposure to loud noises. Age. Tinnitus is more likely in older individuals. Using alcohol or tobacco. What are the signs or symptoms? The main symptom of tinnitus is hearing a sound when there is no source for that sound. It may sound like: Buzzing. Sizzling. Ringing. Blowing air. Hissing. Whistling. Other sounds may include: Roaring. Running water. A musical note. Tapping. Humming. Symptoms may affect only one ear (unilateral) or both ears (bilateral). How is this diagnosed? Tinnitus is diagnosed based on  your symptoms, your medical history, and a physical exam. Your health care provider may do a thorough hearing test (audiologic exam) if your tinnitus: Is unilateral. Causes hearing difficulties. Lasts 6 months or longer. You may work with a health care provider who specializes in hearing disorders (audiologist). You may be asked questions about your symptoms and how they affect your daily life. You may have other tests done, such as: CT scan. MRI. An imaging test of how blood flows through your blood vessels (angiogram). How is this treated? Treating an underlying medical condition can sometimes make tinnitus go away. If your tinnitus continues, other treatments may include: Therapy and counseling to help you manage the stress of living with tinnitus. Sound generators to mask the tinnitus. These include: Tabletop sound machines that play relaxing sounds to help you fall asleep. Wearable devices that fit in your ear and play sounds or music. Acoustic neural stimulation. This involves using headphones to listen to music that contains an auditory signal. Over time, listening to this signal may change some pathways in your brain and make you less sensitive to tinnitus. This treatment is used for very severe cases when no other treatment is working. Using hearing aids or cochlear implants if your tinnitus is related to hearing loss. Hearing aids are worn in the outer ear. Cochlear implants are surgically placed in the inner ear. Follow these instructions at home: Managing symptoms     When possible, avoid being in loud places and being exposed to loud sounds. Wear hearing protection, such as earplugs, when you are exposed to loud noises. Use a white noise machine, a humidifier, or other devices to mask the  sound of tinnitus. Practice techniques for reducing stress, such as meditation, yoga, or deep breathing. Work with your health care provider if you need help with managing stress. Sleep with  your head slightly raised. This may reduce the impact of tinnitus. General instructions Do not use stimulants, such as nicotine, alcohol, or caffeine. Talk with your health care provider about other stimulants to avoid. Stimulants are substances that can make you feel alert and attentive by increasing certain activities in the body (such as heart rate and blood pressure). These substances may make tinnitus worse. Take over-the-counter and prescription medicines only as told by your health care provider. Try to get plenty of sleep each night. Keep all follow-up visits. This is important. Contact a health care provider if: Your tinnitus continues for 3 weeks or longer without stopping. You develop sudden hearing loss. Your symptoms get worse or do not get better with home care. You feel you are not able to manage the stress of living with tinnitus. Get help right away if: You develop tinnitus after a head injury. You have tinnitus along with any of the following: Dizziness. Nausea and vomiting. Loss of balance. Sudden, severe headache. Vision changes. Facial weakness or weakness of arms or legs. These symptoms may represent a serious problem that is an emergency. Do not wait to see if the symptoms will go away. Get medical help right away. Call your local emergency services (911 in the [place] Do not drive yourself to the hospital. Summary Tinnitus refers to hearing a sound when there is no actual source for that sound. This is often described as ringing in the ears. Symptoms may affect only one ear (unilateral) or both ears (bilateral). Use a white noise machine, a humidifier, or other devices to mask the sound of tinnitus. Do not use stimulants, such as nicotine, alcohol, or caffeine. These substances may make tinnitus worse. This information is not intended to replace advice given to you by your health care provider. Make sure you discuss any questions you have with your health care  provider. Document Revised: 09/30/2020 Document Reviewed: 09/30/2020 Elsevier Patient Education  [place].

## 2022-08-11 NOTE — Progress Notes (Signed)
Established Patient Office Visit  Subjective   Patient ID: Kayla Campos, female    DOB: 08-04-1986  Age: 36 y.o. MRN: [id number]  Chief Complaint  Patient presents with   Obesity    Follow up - pt states she does not feel the Mounjaro at current strength is helping. She feels she is always hungry.   Stress    Recent home stressor- father diagnosed with cancer this week.    HPI Patient is a 36 year old obese female with insulin resistance who presents to the clinic to discuss medications.  She is currently on metformin and Mounjaro to help with weight loss and insulin resistance.  She does not feel like the 2.5 mg is helping with her hunger.  She would like to increase this dose.  She denies any GI side effects.  She is trying to stay active but she feels like when she becomes more active she actually gains weight.  He has plateaued at just above 200 since May.  She is under a lot of stress currently.  Her dad was just diagnosed with stage IV esophageal cancer that has metastasized.  She is also in a complicated relationship with her husband.  She does not know if that relationship is going to work out and she feels like she is in limbo.  She is not currently taking any medications for her anxiety and stress and not going to counseling.  She denies any suicidal thoughts or homicidal idealizations.  She does complain of her right ear where she got a piercing a few weeks ago being red, tender, swollen.  This is very tender to touch and not improving with topical cleaning.  She denies any fever, chills, body aches.  She is also noticed for the last month that she has been hearing her heartbeat in her right ear when she lies down at night.  This is very bothersome to her.  It went away for a while and now it is back.  She checks her blood pressure and there is always below 130/80.  She denies any chest pain, palpitations, headaches, vision changes or hearing changes.   . Active  Ambulatory Problems    Diagnosis Date Noted   Keratosis pilaris 10/11/2013   Bloating 05/25/2014   Gastroesophageal reflux disease without esophagitis 05/25/2014   Nausea alone 05/25/2014   Essential hypertension, benign 05/25/2014   Fatty liver disease, nonalcoholic 12/87/8676   BMI 36.0-36.9,adult 12/11/2014   Hyperpigmentation of skin of cheek 12/11/2014   Stress at home 10/29/2015   Abnormal weight gain 10/29/2015   Hair loss 10/29/2015   Class 1 obesity due to excess calories without serious comorbidity with body mass index (BMI) of 32.0 to 32.9 in adult 10/29/2015   Hematuria 02/26/2016   Binge-eating disorder, mild 12/12/2019   Perennial and seasonal allergic rhinitis 12/12/2019   Wheezing 12/12/2019   Allergic conjunctivitis 01/03/2020   Mild persistent asthma 01/03/2020   Food allergy 01/03/2020   Elevated LDL cholesterol level 07/30/2020   Vitiligo 07/30/2020   Anxiety 10/18/2020   Influenza A 02/26/2021   Elevated CK 08/17/2021   Seborrheic keratoses, inflamed 12/23/2021   Insulin resistance 08/11/2022   Pulsatile tinnitus of right ear 08/11/2022   Anxiety and depression 08/11/2022   Resolved Ambulatory Problems    Diagnosis Date Noted   BMI 40.0-44.9, adult ([place]) 10/11/2013   Past Medical History:  Diagnosis Date   Alopecia    Asthma    Eczema    Hypertension  Pleurisy    PNA (pneumonia)    Urticaria      ROS See HPI.    Objective:     BP 119/74   Pulse 88   Ht '5\' 6"'$  (1.676 m)   Wt 201 lb 1.9 oz (91.2 kg)   SpO2 100%   BMI 32.46 kg/m  BP Readings from Last 3 Encounters:  08/11/22 119/74  06/09/22 107/68  03/24/22 121/73   Wt Readings from Last 3 Encounters:  08/11/22 201 lb 1.9 oz (91.2 kg)  06/09/22 204 lb (92.5 kg)  03/24/22 210 lb (95.3 kg)    .Marland Kitchen    08/11/2022   11:53 AM 12/23/2021   10:26 AM 08/07/2021   10:18 AM 10/18/2020    9:46 AM 12/12/2019   10:27 AM  Depression screen PHQ 2/9  Decreased Interest 0 0 0 0 0  Down,  Depressed, Hopeless '3 1 1 '$ 0 1  PHQ - 2 Score '3 1 1 '$ 0 1  Altered sleeping '3  1 1 1  '$ Tired, decreased energy 0  0 1 1  Change in appetite '3  3 1 1  '$ Feeling bad or failure about yourself  0  '2 1 1  '$ Trouble concentrating '3  2 3 1  '$ Moving slowly or fidgety/restless 0  0 1 0  Suicidal thoughts 0  0 0 0  PHQ-9 Score '12  9 8 6  '$ Difficult doing work/chores Somewhat difficult  Somewhat difficult Somewhat difficult Not difficult at all   .Marland Kitchen    08/11/2022   11:54 AM 08/07/2021   10:18 AM 10/18/2020    9:49 AM 12/12/2019   10:28 AM  GAD 7 : Generalized Anxiety Score  Nervous, Anxious, on Edge '3 1 1 1  '$ Control/stop worrying '3 2 1 '$ 0  Worry too much - different things '3 1 1 '$ 0  Trouble relaxing 0 0 1 1  Restless 0 1 1 0  Easily annoyed or irritable 0 1 1 0  Afraid - awful might happen 3 0 0 0  Total GAD 7 Score '12 6 6 2  '$ Anxiety Difficulty Somewhat difficult Somewhat difficult Extremely difficult Not difficult at all      Physical Exam Constitutional:      Appearance: Normal appearance. She is obese.  HENT:     Ears:     Comments: Right auricle around piercing is red, tender and swollen.  Cardiovascular:     Rate and Rhythm: Normal rate.  Pulmonary:     Effort: Pulmonary effort is normal.  Neurological:     General: No focal deficit present.     Mental Status: She is alert and oriented to person, place, and time.  Psychiatric:        Mood and Affect: Mood normal.          Assessment & Plan:  .Joelene was seen today for obesity and stress.  Diagnoses and all orders for this visit:  Class 1 obesity due to excess calories without serious comorbidity with body mass index (BMI) of 32.0 to 32.9 in adult -     tirzepatide [facility]) 5 MG/0.5ML Pen; Inject 5 mg into the skin once a week.  Elevated LDL cholesterol level -     Lipid Panel w/reflex Direct LDL  Thyroid disorder screen -     TSH  Screening for diabetes mellitus -     COMPLETE METABOLIC PANEL WITH GFR -      Hemoglobin A1c  Medication management -     TSH -  Lipid Panel w/reflex Direct LDL -     COMPLETE METABOLIC PANEL WITH GFR -     CBC with Differential/Platelet -     Hemoglobin A1c  Insulin resistance -     Hemoglobin A1c -     tirzepatide (MOUNJARO) 5 MG/0.5ML Pen; Inject 5 mg into the skin once a week.  Pierced ear infection, right, initial encounter -     doxycycline (VIBRA-TABS) 100 MG tablet; Take 1 tablet (100 mg total) by mouth 2 (two) times daily.  Pulsatile tinnitus of right ear -     CT ANGIO HEAD NECK W WO CM; Future  Stress at home  Anxiety and depression   We will get fasting labs today  Patient's weight is stable we will increase Mounjaro to 5 mg weekly Strongly encourage patient to get regular cardio and strength training  Continue metformin Follow-up in 3 months  Discussed importance of getting into counseling with her relationship issues and stress.  Also this would help her process her father's diagnosis and his ongoing health issues.  She declines medication today.  Discussed she can always follow-up and we can discuss this in the future if needed.   For her pulsatile tinnitus we will get a CT angio of head and neck or follow-up.  Doxycycline sent for pierced ear infection follow-up as needed or if symptoms are not improving.    Iran Planas, PA-C

## 2022-08-12 LAB — CBC WITH DIFFERENTIAL/PLATELET
Absolute Monocytes: 545 cells/uL (ref 200–950)
Basophils Absolute: 47 cells/uL (ref 0–200)
Basophils Relative: 0.5 %
Eosinophils Absolute: 395 cells/uL (ref 15–500)
Eosinophils Relative: 4.2 %
HCT: 39.3 % (ref 35.0–45.0)
Hemoglobin: 13.4 g/dL (ref 11.7–15.5)
Lymphs Abs: 3450 cells/uL (ref 850–3900)
MCH: 31.1 pg (ref 27.0–33.0)
MCHC: 34.1 g/dL (ref 32.0–36.0)
MCV: 91.2 fL (ref 80.0–100.0)
MPV: 10.2 fL (ref 7.5–12.5)
Monocytes Relative: 5.8 %
Neutro Abs: 4963 cells/uL (ref 1500–7800)
Neutrophils Relative %: 52.8 %
Platelets: 278 10*3/uL (ref 140–400)
RBC: 4.31 10*6/uL (ref 3.80–5.10)
RDW: 12.3 % (ref 11.0–15.0)
Total Lymphocyte: 36.7 %
WBC: 9.4 10*3/uL (ref 3.8–10.8)

## 2022-08-12 LAB — COMPLETE METABOLIC PANEL WITH GFR
AG Ratio: 1.8 (calc) (ref 1.0–2.5)
ALT: 19 U/L (ref 6–29)
AST: 18 U/L (ref 10–30)
Albumin: 4.5 g/dL (ref 3.6–5.1)
Alkaline phosphatase (APISO): 64 U/L (ref 31–125)
BUN: 15 mg/dL (ref 7–25)
CO2: 25 mmol/L (ref 20–32)
Calcium: 9.9 mg/dL (ref 8.6–10.2)
Chloride: 102 mmol/L (ref 98–110)
Creat: 0.54 mg/dL (ref 0.50–0.97)
Globulin: 2.5 g/dL (calc) (ref 1.9–3.7)
Glucose, Bld: 81 mg/dL (ref 65–99)
Potassium: 4.4 mmol/L (ref 3.5–5.3)
Sodium: 139 mmol/L (ref 135–146)
Total Bilirubin: 0.4 mg/dL (ref 0.2–1.2)
Total Protein: 7 g/dL (ref 6.1–8.1)
eGFR: 122 mL/min/{1.73_m2} (ref >=60)

## 2022-08-12 LAB — TSH: TSH: 1.65 mIU/L

## 2022-08-12 LAB — HEMOGLOBIN A1C
Hgb A1c MFr Bld: 5 % of total Hgb (ref <5.7)
Mean Plasma Glucose: 97 mg/dL
eAG (mmol/L): 5.4 mmol/L

## 2022-08-12 LAB — LIPID PANEL W/REFLEX DIRECT LDL
Cholesterol: 205 mg/dL — ABNORMAL HIGH (ref <200)
HDL: 59 mg/dL (ref >=50)
LDL Cholesterol (Calc): 129 mg/dL (calc) — ABNORMAL HIGH
Non-HDL Cholesterol (Calc): 146 mg/dL (calc) — ABNORMAL HIGH (ref <130)
Total CHOL/HDL Ratio: 3.5 (calc) (ref <5.0)
Triglycerides: 77 mg/dL (ref <150)

## 2022-08-12 NOTE — Progress Notes (Signed)
[name],   Thyroid is good.  Kidney and liver look good.  A1C looks great.  LDL up some but not like 2 years ago.  HDL continues to still look good.

## 2022-08-25 ENCOUNTER — Ambulatory Visit (INDEPENDENT_AMBULATORY_CARE_PROVIDER_SITE_OTHER): Payer: No Typology Code available for payment source | Admitting: Physician Assistant

## 2022-08-25 ENCOUNTER — Encounter: Payer: Self-pay | Admitting: Physician Assistant

## 2022-08-25 ENCOUNTER — Other Ambulatory Visit (HOSPITAL_COMMUNITY): Payer: Self-pay

## 2022-08-25 VITALS — BP 123/75 | HR 70 | Ht 66.0 in | Wt 201.0 lb

## 2022-08-25 DIAGNOSIS — F5081 Binge eating disorder: Secondary | ICD-10-CM

## 2022-08-25 DIAGNOSIS — Z6832 Body mass index (BMI) 32.0-32.9, adult: Secondary | ICD-10-CM

## 2022-08-25 DIAGNOSIS — E88819 Insulin resistance, unspecified: Secondary | ICD-10-CM | POA: Diagnosis not present

## 2022-08-25 DIAGNOSIS — Z0000 Encounter for general adult medical examination without abnormal findings: Secondary | ICD-10-CM

## 2022-08-25 DIAGNOSIS — J3489 Other specified disorders of nose and nasal sinuses: Secondary | ICD-10-CM

## 2022-08-25 DIAGNOSIS — E6609 Other obesity due to excess calories: Secondary | ICD-10-CM | POA: Diagnosis not present

## 2022-08-25 MED ORDER — METFORMIN HCL ER 500 MG PO TB24
500.0000 mg | ORAL_TABLET | Freq: Two times a day (BID) | ORAL | 5 refills | Status: DC
  Filled 2022-09-03 – 2022-09-21 (×2): qty 180, 90d supply, fill #0
  Filled 2022-12-15: qty 180, 90d supply, fill #1

## 2022-08-25 MED ORDER — METFORMIN HCL ER 500 MG PO TB24
500.0000 mg | ORAL_TABLET | Freq: Every evening | ORAL | 3 refills | Status: DC
  Filled 2022-08-25 – 2022-09-21 (×2): qty 90, 90d supply, fill #0

## 2022-08-25 MED ORDER — TIRZEPATIDE 7.5 MG/0.5ML ~~LOC~~ SOAJ
7.5000 mg | SUBCUTANEOUS | 0 refills | Status: DC
  Filled 2022-08-25 – 2022-09-03 (×2): qty 2, 28d supply, fill #0

## 2022-08-25 MED ORDER — METHYLPREDNISOLONE 4 MG PO TBPK
ORAL_TABLET | ORAL | 0 refills | Status: DC
  Filled 2022-08-25: qty 21, 6d supply, fill #0

## 2022-08-25 NOTE — Patient Instructions (Signed)

## 2022-08-25 NOTE — Progress Notes (Signed)
Complete physical exam  Patient: Kayla Campos   DOB: 12-31-1985   36 y.o. Female  MRN: [id number]  Subjective:    Chief Complaint  Patient presents with   Annual Exam    Kayla Campos is a 36 y.o. female who presents today for a complete physical exam. She reports consuming a low fat and low sugar/carbs  diet.  She exercises in gym 2-4 times a week.  She generally feels fairly well. She reports sleeping well. She does not have additional problems to discuss today.   She continues to work on weight loss and insulin resistance. On monjaro 11m and tolerating well. No SE.   She continues to feel congested after finishing doxycycline.      Most recent fall risk assessment:    08/25/2022   10:41 AM  Fall Risk   Falls in the past year? 0  Number falls in past yr: 0  Injury with Fall? 0  Risk for fall due to : No Fall Risks  Follow up Falls evaluation completed     Most recent depression screenings:    08/25/2022   11:21 AM 08/11/2022   11:53 AM  PHQ 2/9 Scores  PHQ - 2 Score 1 3  PHQ- 9 Score 7 12    Vision:Within last year and Dental: No current dental problems  Patient Active Problem List   Diagnosis Date Noted   Insulin resistance 08/11/2022   Pulsatile tinnitus of right ear 08/11/2022   Anxiety and depression 08/11/2022   Seborrheic keratoses, inflamed 12/23/2021   Elevated CK 08/17/2021   Influenza A 02/26/2021   Anxiety 10/18/2020   Elevated LDL cholesterol level 07/30/2020   Vitiligo 07/30/2020   Allergic conjunctivitis 01/03/2020   Mild persistent asthma 01/03/2020   Food allergy 01/03/2020   Binge-eating disorder, mild 12/12/2019   Perennial and seasonal allergic rhinitis 12/12/2019   Wheezing 12/12/2019   Hematuria 02/26/2016   Stress at home 10/29/2015   Abnormal weight gain 10/29/2015   Hair loss 10/29/2015   Class 1 obesity due to excess calories without serious comorbidity with body mass index (BMI) of 32.0 to 32.9 in adult  10/29/2015   BMI 36.0-36.9,adult 12/11/2014   Hyperpigmentation of skin of cheek 12/11/2014   Fatty liver disease, nonalcoholic 023/76/2831  Bloating 05/25/2014   Gastroesophageal reflux disease without esophagitis 05/25/2014   Nausea alone 05/25/2014   Essential hypertension, benign 05/25/2014   Keratosis pilaris 10/11/2013   Past Medical History:  Diagnosis Date   Alopecia    per patient    Asthma    Eczema    Hypertension    Insulin resistance    per patient    Pleurisy    PNA (pneumonia)    Urticaria    Vitiligo    per patient, dx by derm   Family History  Problem Relation Age of Onset   Hypertension Mother    Hypothyroidism Mother    Diabetes Father    Hypertension Father    Coronary artery disease Father    Hyperlipidemia Father    Congestive Heart Failure Father    Esophageal cancer Father    Hypertension Sister    Allergic rhinitis Sister    Eczema Sister    Urticaria Sister    IgA nephropathy Sister    Diabetes Sister    Healthy Sister    Healthy Brother    Hyperlipidemia Maternal Aunt    Heart attack Maternal Grandmother    Heart attack Maternal Grandfather  Diabetes Maternal Grandfather    Stroke Maternal Grandfather    Cancer Paternal Grandmother    Heart attack Paternal Grandmother    Diabetes Paternal Grandmother    Asthma Neg Hx    Angioedema Neg Hx    Allergies  Allergen Reactions   Keflex [Cephalexin] Hives      Patient Care Team: Lavada Mesi as PCP - General (Family Medicine)   Outpatient Medications Prior to Visit  Medication Sig   albuterol (VENTOLIN HFA) 108 (90 Base) MCG/ACT inhaler Inhale 2 puffs into the lungs every 6 (six) hours as needed for wheezing or shortness of breath.   Biotin 10000 MCG TABS Take by mouth.   levocetirizine (XYZAL) 5 MG tablet Take 1 tablet (5 mg total) by mouth daily as needed for allergies   levonorgestrel-ethinyl estradiol (AVIANE) 0.1-20 MG-MCG tablet Take 1 tablet by mouth daily.    montelukast (SINGULAIR) 10 MG tablet Take 1 tablet (10 mg total) by mouth at bedtime.   omeprazole (PRILOSEC) 40 MG capsule Take 1 capsule (40 mg total) by mouth daily.   tirzepatide [facility]) 5 MG/0.5ML Pen Inject 5 mg into the skin once a week.   [DISCONTINUED] metFORMIN (GLUCOPHAGE-XR) 500 MG 24 hr tablet Take 1 tablet (500 mg total) by mouth with evening meal   [DISCONTINUED] doxycycline (VIBRA-TABS) 100 MG tablet Take 1 tablet (100 mg total) by mouth 2 (two) times daily.   No facility-administered medications prior to visit.    Review of Systems  All other systems reviewed and are negative.         Objective:     BP 123/75   Pulse 70   Ht $R'5\' 6"'Ew$  (1.676 m)   Wt 201 lb (91.2 kg)   SpO2 99%   BMI 32.44 kg/m  BP Readings from Last 3 Encounters:  08/25/22 123/75  08/11/22 119/74  06/09/22 107/68   Wt Readings from Last 3 Encounters:  08/25/22 201 lb (91.2 kg)  08/11/22 201 lb 1.9 oz (91.2 kg)  06/09/22 204 lb (92.5 kg)      Physical Exam Vitals reviewed.  Constitutional:      Appearance: Normal appearance. She is obese.  HENT:     Head: Normocephalic.     Right Ear: Tympanic membrane, ear canal and external ear normal. There is no impacted cerumen.     Left Ear: Tympanic membrane, ear canal and external ear normal. There is no impacted cerumen.     Nose: Nose normal.     Mouth/Throat:     Mouth: Mucous membranes are moist.     Pharynx: No posterior oropharyngeal erythema.  Eyes:     Extraocular Movements: Extraocular movements intact.     Conjunctiva/sclera: Conjunctivae normal.     Pupils: Pupils are equal, round, and reactive to light.  Neck:     Vascular: No carotid bruit.  Cardiovascular:     Rate and Rhythm: Normal rate and regular rhythm.     Pulses: Normal pulses.     Heart sounds: Normal heart sounds.  Pulmonary:     Effort: Pulmonary effort is normal.     Breath sounds: Normal breath sounds.  Abdominal:     General: Bowel sounds are normal.  There is no distension.     Palpations: Abdomen is soft. There is no mass.     Tenderness: There is no abdominal tenderness. There is no guarding or rebound.     Hernia: No hernia is present.  Musculoskeletal:     Cervical back:  Normal range of motion and neck supple. No rigidity or tenderness.  Lymphadenopathy:     Cervical: No cervical adenopathy.  Neurological:     General: No focal deficit present.     Mental Status: She is alert and oriented to person, place, and time.  Psychiatric:        Mood and Affect: Mood normal.       Last CBC Lab Results  Component Value Date   WBC 9.4 08/11/2022   HGB 13.4 08/11/2022   HCT 39.3 08/11/2022   MCV 91.2 08/11/2022   MCH 31.1 08/11/2022   RDW 12.3 08/11/2022   PLT 278 26/94/8546   Last metabolic panel Lab Results  Component Value Date   GLUCOSE 81 08/11/2022   NA 139 08/11/2022   K 4.4 08/11/2022   CL 102 08/11/2022   CO2 25 08/11/2022   BUN 15 08/11/2022   CREATININE 0.54 08/11/2022   EGFR 122 08/11/2022   CALCIUM 9.9 08/11/2022   PHOS 3.2 12/23/2020   PROT 7.0 08/11/2022   ALBUMIN 3.3 (L) 01/17/2020   BILITOT 0.4 08/11/2022   ALKPHOS 46 01/17/2020   AST 18 08/11/2022   ALT 19 08/11/2022   ANIONGAP 12 01/17/2020   Last lipids Lab Results  Component Value Date   CHOL 205 (H) 08/11/2022   HDL 59 08/11/2022   LDLCALC 129 (H) 08/11/2022   TRIG 77 08/11/2022   CHOLHDL 3.5 08/11/2022        Assessment & Plan:    Routine Health Maintenance and Physical Exam  Immunization History  Administered Date(s) Administered   Influenza-Unspecified 08/14/2019   PFIZER(Purple Top)SARS-COV-2 Vaccination 11/08/2019, 11/29/2019, 08/23/2020    Health Maintenance  Topic Date Due   COVID-19 Vaccine (4 - Pfizer risk series) 08/27/2022 (Originally 10/18/2020)   INFLUENZA VACCINE  02/07/2023 (Originally 06/09/2022)   HIV Screening  08/12/2023 (Originally 03/09/2000)   TETANUS/TDAP  02/20/2023   PAP SMEAR-Modifier  03/22/2023    Hepatitis C Screening  Completed   HPV VACCINES  Aged Out    Discussed health benefits of physical activity, and encouraged her to engage in regular exercise appropriate for her age and condition.  Marland KitchenJanett Billow was seen today for annual exam.  Diagnoses and all orders for this visit:  Routine physical examination  Class 1 obesity due to excess calories without serious comorbidity with body mass index (BMI) of 32.0 to 32.9 in adult -     tirzepatide [facility]) 7.5 MG/0.5ML Pen; Inject 7.5 mg into the skin once a week. -     metFORMIN (GLUCOPHAGE-XR) 500 MG 24 hr tablet; Take 1 tablet (500 mg total) by mouth with evening meal  Binge-eating disorder, mild  Insulin resistance -     tirzepatide (MOUNJARO) 7.5 MG/0.5ML Pen; Inject 7.5 mg into the skin once a week. -     metFORMIN (GLUCOPHAGE-XR) 500 MG 24 hr tablet; Take 1 tablet (500 mg total) by mouth with evening meal  Sinus pressure -     methylPREDNISolone (MEDROL DOSEPAK) 4 MG TBPK tablet; Take as directed.    Discussed 150 minutes of exercise a week.  Encouraged vitamin D 1000 units and Calcium 13549m or 4 servings of dairy a day.  Reviewed fasting labs Sent 7.518mof mounjaro to continue after 49m68metformin refilled Medrol dose pack for residual sinus pressure after treated sinus infection with doxycycline Will get flu shot at work Declined covid vaccine  Follow up in 3 months on weight    JadIran PlanasA-C

## 2022-09-03 ENCOUNTER — Other Ambulatory Visit (HOSPITAL_COMMUNITY): Payer: Self-pay

## 2022-09-21 ENCOUNTER — Other Ambulatory Visit: Payer: Self-pay | Admitting: Physician Assistant

## 2022-09-21 ENCOUNTER — Telehealth: Payer: No Typology Code available for payment source | Admitting: Family Medicine

## 2022-09-21 ENCOUNTER — Other Ambulatory Visit (HOSPITAL_COMMUNITY): Payer: Self-pay

## 2022-09-21 DIAGNOSIS — J019 Acute sinusitis, unspecified: Secondary | ICD-10-CM

## 2022-09-21 DIAGNOSIS — J45901 Unspecified asthma with (acute) exacerbation: Secondary | ICD-10-CM | POA: Diagnosis not present

## 2022-09-21 DIAGNOSIS — T3695XA Adverse effect of unspecified systemic antibiotic, initial encounter: Secondary | ICD-10-CM

## 2022-09-21 DIAGNOSIS — B379 Candidiasis, unspecified: Secondary | ICD-10-CM

## 2022-09-21 DIAGNOSIS — B9689 Other specified bacterial agents as the cause of diseases classified elsewhere: Secondary | ICD-10-CM | POA: Diagnosis not present

## 2022-09-21 MED ORDER — MONTELUKAST SODIUM 10 MG PO TABS
10.0000 mg | ORAL_TABLET | Freq: Every day | ORAL | 3 refills | Status: DC
  Filled 2022-09-21 – 2022-11-06 (×2): qty 90, 90d supply, fill #0

## 2022-09-21 MED ORDER — DOXYCYCLINE HYCLATE 100 MG PO TABS
100.0000 mg | ORAL_TABLET | Freq: Two times a day (BID) | ORAL | 0 refills | Status: AC
  Filled 2022-09-21: qty 20, 10d supply, fill #0

## 2022-09-21 MED ORDER — FLUCONAZOLE 150 MG PO TABS
150.0000 mg | ORAL_TABLET | Freq: Every day | ORAL | 0 refills | Status: DC
  Filled 2022-09-21: qty 1, 1d supply, fill #0

## 2022-09-21 MED ORDER — ALBUTEROL SULFATE HFA 108 (90 BASE) MCG/ACT IN AERS
2.0000 | INHALATION_SPRAY | Freq: Four times a day (QID) | RESPIRATORY_TRACT | 0 refills | Status: DC | PRN
  Filled 2022-09-21: qty 6.7, 25d supply, fill #0

## 2022-09-21 NOTE — Patient Instructions (Signed)
Kayla Campos, thank you for joining Perlie Mayo, NP for today's virtual visit.  While this provider is not your primary care provider (PCP), if your PCP is located in our provider database this encounter information will be shared with them immediately following your visit.   [facility] account gives you access to today's visit and all your visits, tests, and labs performed at [facility] " click here if you don't have a [facility] account or go to [email or web address]  Consent: (Patient) Kayla Campos provided verbal consent for this virtual visit at the beginning of the encounter.  Current Medications:  Current Outpatient Medications:    doxycycline (VIBRA-TABS) 100 MG tablet, Take 1 tablet (100 mg total) by mouth 2 (two) times daily for 10 days., Disp: 20 tablet, Rfl: 0   fluconazole (DIFLUCAN) 150 MG tablet, Take 1 tablet (150 mg total) by mouth daily., Disp: 1 tablet, Rfl: 0   albuterol (VENTOLIN HFA) 108 (90 Base) MCG/ACT inhaler, Inhale 2 puffs into the lungs every 6 (six) hours as needed for wheezing or shortness of breath., Disp: 8.5 g, Rfl: 0   Biotin 10000 MCG TABS, Take by mouth., Disp: , Rfl:    levocetirizine (XYZAL) 5 MG tablet, Take 1 tablet (5 mg total) by mouth daily as needed for allergies, Disp: 90 tablet, Rfl: 2   levonorgestrel-ethinyl estradiol (AVIANE) 0.1-20 MG-MCG tablet, Take 1 tablet by mouth daily., Disp: 84 tablet, Rfl: 3   metFORMIN (GLUCOPHAGE-XR) 500 MG 24 hr tablet, Take 1 tablet (500 mg total) by mouth with evening meal, Disp: 90 tablet, Rfl: 3   metFORMIN (GLUCOPHAGE-XR) 500 MG 24 hr tablet, Take 1 tablet (500 mg total) by mouth 2 (two) times daily, Disp: 60 tablet, Rfl: 5   methylPREDNISolone (MEDROL DOSEPAK) 4 MG TBPK tablet, Take as directed., Disp: 21 tablet, Rfl: 0   montelukast (SINGULAIR) 10 MG tablet, Take 1 tablet (10 mg total) by mouth at bedtime., Disp: 90 tablet, Rfl: 3   omeprazole  (PRILOSEC) 40 MG capsule, Take 1 capsule (40 mg total) by mouth daily., Disp: 90 capsule, Rfl: 3   tirzepatide (MOUNJARO) 5 MG/0.5ML Pen, Inject 5 mg into the skin once a week., Disp: 2 mL, Rfl: 2   tirzepatide (MOUNJARO) 7.5 MG/0.5ML Pen, Inject 7.5 mg into the skin once a week., Disp: 2 mL, Rfl: 0   Medications ordered in this encounter:  Meds ordered this encounter  Medications   doxycycline (VIBRA-TABS) 100 MG tablet    Sig: Take 1 tablet (100 mg total) by mouth 2 (two) times daily for 10 days.    Dispense:  20 tablet    Refill:  0    Order Specific Question:   Supervising Provider    Answer:   Bari Mantis   albuterol (VENTOLIN HFA) 108 (90 Base) MCG/ACT inhaler    Sig: Inhale 2 puffs into the lungs every 6 (six) hours as needed for wheezing or shortness of breath.    Dispense:  8.5 g    Refill:  0    Order Specific Question:   Supervising Provider    Answer:   Chase Picket [[id number]]   fluconazole (DIFLUCAN) 150 MG tablet    Sig: Take 1 tablet (150 mg total) by mouth daily.    Dispense:  1 tablet    Refill:  0    Order Specific Question:   Supervising Provider    Answer:   Chase Picket [id number]     *  If you need refills on other medications prior to your next appointment, please contact your pharmacy*  Follow-Up: Call back or seek an in-person evaluation if the symptoms worsen or if the condition fails to improve as anticipated.  [name] [phone]  Other Instructions  -Take meds as prescribed -Rest -Use a cool mist humidifier especially during the winter months when heat dries out the air. - Use saline nose sprays frequently to help soothe nasal passages and promote drainage. -Saline irrigations of the nose can be very helpful if done frequently.             * 4X daily for 1 week*             * Use of a nettie pot can be helpful with this.  *Follow directions with this* *Boiled or distilled water only -stay hydrated by  drinking plenty of fluids - Keep thermostat turn down low to prevent drying out sinuses - For any cough or congestion- robitussin DM or Delsym as needed - For fever or aches or pains- take tylenol or ibuprofen as directed on bottle             * for fevers greater than 101 orally you may alternate ibuprofen and tylenol every 3 hours.  If you do not improve you will need a follow up visit in person.                   If you have been instructed to have an in-person evaluation today at a local Urgent Care facility, please use the link below. It will take you to a list of all of our available [facility], including address, phone number and hours of operation. Please do not delay care.  [facility]  If you or a family member do not have a primary care provider, use the link below to schedule a visit and establish care. When you choose a [facility] primary care physician or advanced practice provider, you gain a long-term partner in health. Find a Primary Care Provider  Learn more about [facility] in-office and virtual care options: [facility]

## 2022-09-21 NOTE — Progress Notes (Signed)
Virtual Visit Consent   Kayla Campos, you are scheduled for a virtual visit with a [facility] provider today. Just as with appointments in the office, your consent must be obtained to participate. Your consent will be active for this visit and any virtual visit you may have with one of our providers in the next 365 days. If you have a MyChart account, a copy of this consent can be sent to you electronically.  As this is a virtual visit, video technology does not allow for your provider to perform a traditional examination. This may limit your provider's ability to fully assess your condition. If your provider identifies any concerns that need to be evaluated in person or the need to arrange testing (such as labs, EKG, etc.), we will make arrangements to do so. Although advances in technology are sophisticated, we cannot ensure that it will always work on either your end or our end. If the connection with a video visit is poor, the visit may have to be switched to a telephone visit. With either a video or telephone visit, we are not always able to ensure that we have a secure connection.  By engaging in this virtual visit, you consent to the provision of healthcare and authorize for your insurance to be billed (if applicable) for the services provided during this visit. Depending on your insurance coverage, you may receive a charge related to this service.  I need to obtain your verbal consent now. Are you willing to proceed with your visit today? Kayla Campos has provided verbal consent on 09/21/2022 for a virtual visit (video or telephone). Perlie Mayo, NP  Date: 09/21/2022 11:46 AM  Virtual Visit via Video Note   I, Perlie Mayo, connected with  Kayla Campos  ([id number], December 21, 1985) on 09/21/22 at 11:45 AM EST by a video-enabled telemedicine application and verified that I am speaking with the correct person using two identifiers.  Location: Patient: Virtual  Visit Location Patient: Home Provider: Virtual Visit Location Provider: Home Office   I discussed the limitations of evaluation and management by telemedicine and the availability of in person appointments. The patient expressed understanding and agreed to proceed.    History of Present Illness: Kayla Campos is a 36 y.o. who identifies as a female who was assigned female at birth, and is being seen today for sinus congestion that has been off and on viral related for several weeks. Over weekend she developed some increased drainage-thick yellow- from chest as well, hoarseness. Denies fevers, chills, chest pain, shortness of breath, ear pain. History of asthma, PNA and Pleurisy.    Problems:  Patient Active Problem List   Diagnosis Date Noted   Insulin resistance 08/11/2022   Pulsatile tinnitus of right ear 08/11/2022   Anxiety and depression 08/11/2022   Seborrheic keratoses, inflamed 12/23/2021   Elevated CK 08/17/2021   Influenza A 02/26/2021   Anxiety 10/18/2020   Elevated LDL cholesterol level 07/30/2020   Vitiligo 07/30/2020   Allergic conjunctivitis 01/03/2020   Mild persistent asthma 01/03/2020   Food allergy 01/03/2020   Binge-eating disorder, mild 12/12/2019   Perennial and seasonal allergic rhinitis 12/12/2019   Wheezing 12/12/2019   Hematuria 02/26/2016   Stress at home 10/29/2015   Abnormal weight gain 10/29/2015   Hair loss 10/29/2015   Class 1 obesity due to excess calories without serious comorbidity with body mass index (BMI) of 32.0 to 32.9 in adult 10/29/2015   BMI 36.0-36.9,adult 12/11/2014   Hyperpigmentation  of skin of cheek 12/11/2014   Fatty liver disease, nonalcoholic 59/56/3875   Bloating 05/25/2014   Gastroesophageal reflux disease without esophagitis 05/25/2014   Nausea alone 05/25/2014   Essential hypertension, benign 05/25/2014   Keratosis pilaris 10/11/2013    Allergies:  Allergies  Allergen Reactions   Keflex [Cephalexin] Hives    Medications:  Current Outpatient Medications:    albuterol (VENTOLIN HFA) 108 (90 Base) MCG/ACT inhaler, Inhale 2 puffs into the lungs every 6 (six) hours as needed for wheezing or shortness of breath., Disp: 8.5 g, Rfl: 0   Biotin 10000 MCG TABS, Take by mouth., Disp: , Rfl:    levocetirizine (XYZAL) 5 MG tablet, Take 1 tablet (5 mg total) by mouth daily as needed for allergies, Disp: 90 tablet, Rfl: 2   levonorgestrel-ethinyl estradiol (AVIANE) 0.1-20 MG-MCG tablet, Take 1 tablet by mouth daily., Disp: 84 tablet, Rfl: 3   metFORMIN (GLUCOPHAGE-XR) 500 MG 24 hr tablet, Take 1 tablet (500 mg total) by mouth with evening meal, Disp: 90 tablet, Rfl: 3   metFORMIN (GLUCOPHAGE-XR) 500 MG 24 hr tablet, Take 1 tablet (500 mg total) by mouth 2 (two) times daily, Disp: 60 tablet, Rfl: 5   methylPREDNISolone (MEDROL DOSEPAK) 4 MG TBPK tablet, Take as directed., Disp: 21 tablet, Rfl: 0   montelukast (SINGULAIR) 10 MG tablet, Take 1 tablet (10 mg total) by mouth at bedtime., Disp: 90 tablet, Rfl: 3   omeprazole (PRILOSEC) 40 MG capsule, Take 1 capsule (40 mg total) by mouth daily., Disp: 90 capsule, Rfl: 3   tirzepatide (MOUNJARO) 5 MG/0.5ML Pen, Inject 5 mg into the skin once a week., Disp: 2 mL, Rfl: 2   tirzepatide (MOUNJARO) 7.5 MG/0.5ML Pen, Inject 7.5 mg into the skin once a week., Disp: 2 mL, Rfl: 0  Observations/Objective: Patient is well-developed, well-nourished in no acute distress.  Resting comfortably  at home.  Head is normocephalic, atraumatic.  No labored breathing.  Speech is clear and coherent with logical content.  Patient is alert and oriented at baseline.  Congestion, nasal tone, voice change  Assessment and Plan:  1. Acute bacterial sinusitis  - doxycycline (VIBRA-TABS) 100 MG tablet; Take 1 tablet (100 mg total) by mouth 2 (two) times daily for 10 days.  Dispense: 20 tablet; Refill: 0 - albuterol (VENTOLIN HFA) 108 (90 Base) MCG/ACT inhaler; Inhale 2 puffs into the lungs  every 6 (six) hours as needed for wheezing or shortness of breath.  Dispense: 8.5 g; Refill: 0  2. Mild asthma exacerbation  - albuterol (VENTOLIN HFA) 108 (90 Base) MCG/ACT inhaler; Inhale 2 puffs into the lungs every 6 (six) hours as needed for wheezing or shortness of breath.  Dispense: 8.5 g; Refill: 0  3. Antibiotic-induced yeast infection  - fluconazole (DIFLUCAN) 150 MG tablet; Take 1 tablet (150 mg total) by mouth daily.  Dispense: 1 tablet; Refill: 0  -no red flags today, asthma is controlled at this time, has pred from PCP if needed -Take meds as prescribed -Rest -Use a cool mist humidifier especially during the winter months when heat dries out the air. - Use saline nose sprays frequently to help soothe nasal passages and promote drainage. -Saline irrigations of the nose can be very helpful if done frequently.             * 4X daily for 1 week*             * Use of a nettie pot can be helpful with this.  *Follow directions with  this* *Boiled or distilled water only -stay hydrated by drinking plenty of fluids - Keep thermostat turn down low to prevent drying out sinuses - For any cough or congestion- robitussin DM or Delsym as needed - For fever or aches or pains- take tylenol or ibuprofen as directed on bottle             * for fevers greater than 101 orally you may alternate ibuprofen and tylenol every 3 hours.  If you do not improve you will need a follow up visit in person.               Additionally- yeast infection post abx, so Diflucan provided   Reviewed side effects, risks and benefits of medication.    Patient acknowledged agreement and understanding of the plan.   Past Medical, Surgical, Social History, Allergies, and Medications have been Reviewed.    Follow Up Instructions: I discussed the assessment and treatment plan with the patient. The patient was provided an opportunity to ask questions and all were answered. The patient agreed with the plan and  demonstrated an understanding of the instructions.  A copy of instructions were sent to the patient via MyChart unless otherwise noted below.     The patient was advised to call back or seek an in-person evaluation if the symptoms worsen or if the condition fails to improve as anticipated.  Time:  I spent 10 minutes with the patient via telehealth technology discussing the above problems/concerns.    Perlie Mayo, NP

## 2022-09-30 ENCOUNTER — Other Ambulatory Visit (HOSPITAL_COMMUNITY): Payer: Self-pay

## 2022-10-05 ENCOUNTER — Encounter: Payer: Self-pay | Admitting: Physician Assistant

## 2022-10-05 ENCOUNTER — Other Ambulatory Visit: Payer: Self-pay | Admitting: Physician Assistant

## 2022-10-05 ENCOUNTER — Other Ambulatory Visit (HOSPITAL_COMMUNITY): Payer: Self-pay

## 2022-10-05 DIAGNOSIS — E6609 Other obesity due to excess calories: Secondary | ICD-10-CM

## 2022-10-05 DIAGNOSIS — L304 Erythema intertrigo: Secondary | ICD-10-CM | POA: Insufficient documentation

## 2022-10-05 DIAGNOSIS — E88819 Insulin resistance, unspecified: Secondary | ICD-10-CM

## 2022-10-05 MED ORDER — NYSTATIN 100000 UNIT/GM EX OINT
1.0000 | TOPICAL_OINTMENT | Freq: Two times a day (BID) | CUTANEOUS | 1 refills | Status: DC
  Filled 2022-10-05: qty 30, 30d supply, fill #0
  Filled 2022-11-06 (×2): qty 30, 30d supply, fill #1

## 2022-10-05 MED ORDER — FLUCONAZOLE 150 MG PO TABS
150.0000 mg | ORAL_TABLET | Freq: Every day | ORAL | 1 refills | Status: DC
  Filled 2022-10-05: qty 1, 1d supply, fill #0

## 2022-10-05 NOTE — Telephone Encounter (Signed)
..  Please see the MyChart message reply(ies) for my assessment and plan.    This patient gave consent for this Medical Advice Message and is aware that it may result in a bill to [employer], as well as the possibility of receiving a bill for a co-payment or deductible. They are an established patient, but are not seeking medical advice exclusively about a problem treated during an in person or video visit in the last seven days. I did not recommend an in person or video visit within seven days of my reply.    I spent a total of 5 minutes cumulative time within 7 days through [employer].  Iran Planas, PA-C

## 2022-10-06 ENCOUNTER — Other Ambulatory Visit (HOSPITAL_COMMUNITY): Payer: Self-pay

## 2022-10-06 MED ORDER — MOUNJARO 7.5 MG/0.5ML ~~LOC~~ SOAJ
7.5000 mg | SUBCUTANEOUS | 0 refills | Status: DC
  Filled 2022-10-06: qty 2, 28d supply, fill #0

## 2022-10-28 ENCOUNTER — Encounter: Payer: Self-pay | Admitting: Family Medicine

## 2022-10-28 NOTE — Progress Notes (Signed)
b

## 2022-11-06 ENCOUNTER — Encounter: Payer: Self-pay | Admitting: Physician Assistant

## 2022-11-06 ENCOUNTER — Other Ambulatory Visit (HOSPITAL_COMMUNITY): Payer: Self-pay

## 2022-11-06 ENCOUNTER — Other Ambulatory Visit: Payer: Self-pay

## 2022-11-06 DIAGNOSIS — E88819 Insulin resistance, unspecified: Secondary | ICD-10-CM

## 2022-11-06 DIAGNOSIS — F5081 Binge eating disorder: Secondary | ICD-10-CM

## 2022-11-06 MED ORDER — TIRZEPATIDE 10 MG/0.5ML ~~LOC~~ SOAJ
10.0000 mg | SUBCUTANEOUS | 0 refills | Status: DC
  Filled 2022-11-06: qty 2, 28d supply, fill #0

## 2022-11-06 NOTE — Telephone Encounter (Signed)
Meds ordered this encounter  Medications   tirzepatide (MOUNJARO) 10 MG/0.5ML Pen    Sig: Inject 10 mg into the skin once a week.    Dispense:  2 mL    Refill:  0

## 2022-11-10 ENCOUNTER — Ambulatory Visit: Payer: No Typology Code available for payment source | Admitting: Physician Assistant

## 2022-11-12 ENCOUNTER — Ambulatory Visit: Payer: No Typology Code available for payment source | Admitting: Family Medicine

## 2022-11-18 ENCOUNTER — Ambulatory Visit: Payer: 59 | Admitting: Physician Assistant

## 2022-11-23 ENCOUNTER — Other Ambulatory Visit (HOSPITAL_COMMUNITY): Payer: Self-pay

## 2022-11-23 ENCOUNTER — Encounter: Payer: Self-pay | Admitting: Physician Assistant

## 2022-11-23 ENCOUNTER — Ambulatory Visit (INDEPENDENT_AMBULATORY_CARE_PROVIDER_SITE_OTHER): Payer: 59 | Admitting: Physician Assistant

## 2022-11-23 VITALS — BP 100/66 | HR 83 | Ht 66.0 in | Wt 186.1 lb

## 2022-11-23 DIAGNOSIS — E88819 Insulin resistance, unspecified: Secondary | ICD-10-CM | POA: Diagnosis not present

## 2022-11-23 DIAGNOSIS — L987 Excessive and redundant skin and subcutaneous tissue: Secondary | ICD-10-CM | POA: Diagnosis not present

## 2022-11-23 DIAGNOSIS — Z6830 Body mass index (BMI) 30.0-30.9, adult: Secondary | ICD-10-CM

## 2022-11-23 DIAGNOSIS — Z79899 Other long term (current) drug therapy: Secondary | ICD-10-CM | POA: Diagnosis not present

## 2022-11-23 DIAGNOSIS — L304 Erythema intertrigo: Secondary | ICD-10-CM | POA: Diagnosis not present

## 2022-11-23 DIAGNOSIS — E6609 Other obesity due to excess calories: Secondary | ICD-10-CM

## 2022-11-23 MED ORDER — MOUNJARO 10 MG/0.5ML ~~LOC~~ SOAJ
10.0000 mg | SUBCUTANEOUS | 1 refills | Status: DC
  Filled 2022-11-23: qty 6, 84d supply, fill #0
  Filled 2022-11-25 – 2022-11-29 (×2): qty 2, 28d supply, fill #0

## 2022-11-23 MED ORDER — NYSTATIN 100000 UNIT/GM EX POWD
1.0000 | Freq: Three times a day (TID) | CUTANEOUS | 5 refills | Status: DC
  Filled 2022-11-23: qty 60, 20d supply, fill #0
  Filled 2022-12-15: qty 60, 20d supply, fill #1
  Filled 2023-02-01: qty 60, 20d supply, fill #2

## 2022-11-23 NOTE — Progress Notes (Signed)
Established Patient Office Visit  Subjective   Patient ID: Kayla Campos, female    DOB: 1986/09/02  Age: 37 y.o. MRN: [id number]  Chief Complaint  Patient presents with   Weight Loss    HPI Pt is a 37 yo obese female with insulin resistance who presents to the clinic to follow up on mounjaro for weight and inusulin regulation. Pt is doing well. She has lost 15lbs over the last 3 months. She just increased to the '10mg'$  weekly dose. She has some nausea and constipation but very manageable. She is walking regularly. She does become frustrated with yeast infection of skin in her folds where she has lost so much weight. She is using the ointment but would like to try the powder.  .. Active Ambulatory Problems    Diagnosis Date Noted   Keratosis pilaris 10/11/2013   Bloating 05/25/2014   Gastroesophageal reflux disease without esophagitis 05/25/2014   Nausea alone 05/25/2014   Essential hypertension, benign 05/25/2014   Fatty liver disease, nonalcoholic 40/98/1191   BMI 36.0-36.9,adult 12/11/2014   Hyperpigmentation of skin of cheek 12/11/2014   Stress at home 10/29/2015   Abnormal weight gain 10/29/2015   Hair loss 10/29/2015   Class 1 obesity due to excess calories with serious comorbidity and body mass index (BMI) of 30.0 to 30.9 in adult 10/29/2015   Hematuria 02/26/2016   Binge-eating disorder, mild 12/12/2019   Perennial and seasonal allergic rhinitis 12/12/2019   Wheezing 12/12/2019   Allergic conjunctivitis 01/03/2020   Mild persistent asthma 01/03/2020   Food allergy 01/03/2020   Elevated LDL cholesterol level 07/30/2020   Vitiligo 07/30/2020   Anxiety 10/18/2020   Influenza A 02/26/2021   Elevated CK 08/17/2021   Seborrheic keratoses, inflamed 12/23/2021   Insulin resistance 08/11/2022   Pulsatile tinnitus of right ear 08/11/2022   Anxiety and depression 08/11/2022   Intertrigo 10/05/2022   Resolved Ambulatory Problems    Diagnosis Date Noted   BMI  40.0-44.9, adult ([place]) 10/11/2013   Past Medical History:  Diagnosis Date   Alopecia    Asthma    Eczema    Hypertension    Pleurisy    PNA (pneumonia)    Urticaria      ROS   See HPI.  Objective:     BP 100/66 (BP Location: Right Arm, Patient Position: Sitting, Cuff Size: Normal)   Pulse 83   Ht '5\' 6"'$  (1.676 m)   Wt 186 lb 2 oz (84.4 kg)   SpO2 100%   BMI 30.04 kg/m  BP Readings from Last 3 Encounters:  11/23/22 100/66  08/25/22 123/75  08/11/22 119/74   Wt Readings from Last 3 Encounters:  11/23/22 186 lb 2 oz (84.4 kg)  08/25/22 201 lb (91.2 kg)  08/11/22 201 lb 1.9 oz (91.2 kg)     Physical Exam Constitutional:      Appearance: Normal appearance. She is obese.  HENT:     Head: Normocephalic.  Cardiovascular:     Rate and Rhythm: Normal rate and regular rhythm.  Pulmonary:     Effort: Pulmonary effort is normal.  Skin:    Comments: Erythematous rash under bilateral breast.   Neurological:     General: No focal deficit present.     Mental Status: She is alert and oriented to person, place, and time.  Psychiatric:        Mood and Affect: Mood normal.         Assessment & Plan:  .Marland KitchenJaviana was seen  today for weight loss.  Diagnoses and all orders for this visit:  Insulin resistance -     tirzepatide (MOUNJARO) 10 MG/0.5ML Pen; Inject 10 mg into the skin once a week.  Medication management -     COMPLETE METABOLIC PANEL WITH GFR  Class 1 obesity due to excess calories with serious comorbidity and body mass index (BMI) of 30.0 to 30.9 in adult -     tirzepatide (MOUNJARO) 10 MG/0.5ML Pen; Inject 10 mg into the skin once a week.  Intertrigo -     nystatin powder; Apply 1 Application topically 3 (three) times daily.   Continue to work on weight loss and insulin resistance  Stay at '10mg'$  weekly Cmp ordered Continue healthy diet and regular exercise Discussed skin removal surgery in the future For now nystatin powder for yeast  prevention Follow up in 6 months.    Iran Planas, PA-C

## 2022-11-24 ENCOUNTER — Other Ambulatory Visit: Payer: Self-pay | Admitting: Neurology

## 2022-11-24 DIAGNOSIS — R748 Abnormal levels of other serum enzymes: Secondary | ICD-10-CM

## 2022-11-24 LAB — COMPLETE METABOLIC PANEL WITH GFR
AG Ratio: 1.7 (calc) (ref 1.0–2.5)
ALT: 41 U/L — ABNORMAL HIGH (ref 6–29)
AST: 40 U/L — ABNORMAL HIGH (ref 10–30)
Albumin: 4.5 g/dL (ref 3.6–5.1)
Alkaline phosphatase (APISO): 50 U/L (ref 31–125)
BUN: 15 mg/dL (ref 7–25)
CO2: 28 mmol/L (ref 20–32)
Calcium: 10.2 mg/dL (ref 8.6–10.2)
Chloride: 102 mmol/L (ref 98–110)
Creat: 0.54 mg/dL (ref 0.50–0.97)
Globulin: 2.7 g/dL (calc) (ref 1.9–3.7)
Glucose, Bld: 75 mg/dL (ref 65–99)
Potassium: 4.3 mmol/L (ref 3.5–5.3)
Sodium: 139 mmol/L (ref 135–146)
Total Bilirubin: 0.5 mg/dL (ref 0.2–1.2)
Total Protein: 7.2 g/dL (ref 6.1–8.1)
eGFR: 122 mL/min/{1.73_m2} (ref >=60)

## 2022-11-24 NOTE — Progress Notes (Signed)
Liver enzymes are up just a bit from normal. I want to check in 4 weeks to see if this is trending up. We may need to back down on mounjaro if liver enzymes do not normalize.

## 2022-11-25 ENCOUNTER — Other Ambulatory Visit (HOSPITAL_COMMUNITY): Payer: Self-pay

## 2022-11-30 ENCOUNTER — Other Ambulatory Visit: Payer: Self-pay

## 2022-11-30 ENCOUNTER — Other Ambulatory Visit (HOSPITAL_BASED_OUTPATIENT_CLINIC_OR_DEPARTMENT_OTHER): Payer: Self-pay

## 2022-12-01 ENCOUNTER — Other Ambulatory Visit (HOSPITAL_BASED_OUTPATIENT_CLINIC_OR_DEPARTMENT_OTHER): Payer: Self-pay

## 2022-12-02 ENCOUNTER — Other Ambulatory Visit (HOSPITAL_BASED_OUTPATIENT_CLINIC_OR_DEPARTMENT_OTHER): Payer: Self-pay

## 2022-12-02 ENCOUNTER — Telehealth: Payer: Self-pay

## 2022-12-02 ENCOUNTER — Encounter: Payer: Self-pay | Admitting: Physician Assistant

## 2022-12-02 NOTE — Telephone Encounter (Addendum)
Initiated Prior authorization [id number] 10MG/0.5ML pen-injectors Via: Covermymeds Case/Key:[id number] Status: approved  as of 12/02/22 Reason:The request has been approved. The authorization is effective from 12/02/2022 to 12/02/2023, as long as the member is enrolled in their current health plan. The request was approved with a quantity restriction. This has been approved for a max daily dosage of 0.07. A written notification letter will follow with additional details. Notified Pt via: Mychart

## 2022-12-03 ENCOUNTER — Other Ambulatory Visit (HOSPITAL_BASED_OUTPATIENT_CLINIC_OR_DEPARTMENT_OTHER): Payer: Self-pay

## 2022-12-04 ENCOUNTER — Other Ambulatory Visit (HOSPITAL_COMMUNITY): Payer: Self-pay

## 2022-12-04 ENCOUNTER — Other Ambulatory Visit (HOSPITAL_BASED_OUTPATIENT_CLINIC_OR_DEPARTMENT_OTHER): Payer: Self-pay

## 2022-12-04 MED ORDER — TIRZEPATIDE 7.5 MG/0.5ML ~~LOC~~ SOAJ
7.5000 mg | SUBCUTANEOUS | 0 refills | Status: DC
  Filled 2022-12-04: qty 6, 84d supply, fill #0
  Filled 2022-12-04: qty 2, 28d supply, fill #0
  Filled 2022-12-27: qty 2, 28d supply, fill #1
  Filled 2023-02-01: qty 2, 28d supply, fill #2

## 2022-12-04 MED ORDER — ONDANSETRON 8 MG PO TBDP
8.0000 mg | ORAL_TABLET | Freq: Three times a day (TID) | ORAL | 3 refills | Status: DC | PRN
  Filled 2022-12-04: qty 20, 7d supply, fill #0
  Filled 2022-12-28: qty 20, 7d supply, fill #1

## 2022-12-15 ENCOUNTER — Other Ambulatory Visit: Payer: Self-pay

## 2022-12-24 ENCOUNTER — Other Ambulatory Visit: Payer: 59

## 2022-12-24 DIAGNOSIS — R748 Abnormal levels of other serum enzymes: Secondary | ICD-10-CM | POA: Diagnosis not present

## 2022-12-25 LAB — HEPATIC FUNCTION PANEL
AG Ratio: 1.7 (calc) (ref 1.0–2.5)
ALT: 11 U/L (ref 6–29)
AST: 12 U/L (ref 10–30)
Albumin: 4.3 g/dL (ref 3.6–5.1)
Alkaline phosphatase (APISO): 46 U/L (ref 31–125)
Bilirubin, Direct: 0.1 mg/dL (ref 0.0–0.2)
Globulin: 2.5 g/dL (calc) (ref 1.9–3.7)
Indirect Bilirubin: 0.4 mg/dL (calc) (ref 0.2–1.2)
Total Bilirubin: 0.5 mg/dL (ref 0.2–1.2)
Total Protein: 6.8 g/dL (ref 6.1–8.1)

## 2022-12-25 NOTE — Progress Notes (Signed)
Liver enzymes are back to normal.

## 2023-01-13 DIAGNOSIS — Z3A01 Less than 8 weeks gestation of pregnancy: Secondary | ICD-10-CM | POA: Diagnosis not present

## 2023-01-13 DIAGNOSIS — O200 Threatened abortion: Secondary | ICD-10-CM | POA: Diagnosis not present

## 2023-01-13 DIAGNOSIS — O209 Hemorrhage in early pregnancy, unspecified: Secondary | ICD-10-CM | POA: Diagnosis not present

## 2023-01-15 DIAGNOSIS — O209 Hemorrhage in early pregnancy, unspecified: Secondary | ICD-10-CM | POA: Diagnosis not present

## 2023-02-01 ENCOUNTER — Other Ambulatory Visit (HOSPITAL_COMMUNITY): Payer: Self-pay

## 2023-02-01 ENCOUNTER — Other Ambulatory Visit: Payer: Self-pay | Admitting: Physician Assistant

## 2023-02-01 MED ORDER — LEVOCETIRIZINE DIHYDROCHLORIDE 5 MG PO TABS
5.0000 mg | ORAL_TABLET | Freq: Every day | ORAL | 2 refills | Status: DC | PRN
  Filled 2023-02-01: qty 90, 90d supply, fill #0

## 2023-02-02 ENCOUNTER — Other Ambulatory Visit (HOSPITAL_COMMUNITY): Payer: Self-pay

## 2023-02-02 ENCOUNTER — Other Ambulatory Visit: Payer: Self-pay

## 2023-03-31 DIAGNOSIS — Z8759 Personal history of other complications of pregnancy, childbirth and the puerperium: Secondary | ICD-10-CM | POA: Diagnosis not present

## 2023-04-02 DIAGNOSIS — Z8759 Personal history of other complications of pregnancy, childbirth and the puerperium: Secondary | ICD-10-CM | POA: Diagnosis not present

## 2023-04-06 ENCOUNTER — Other Ambulatory Visit (HOSPITAL_COMMUNITY): Payer: Self-pay

## 2023-04-09 ENCOUNTER — Other Ambulatory Visit (HOSPITAL_COMMUNITY): Payer: Self-pay

## 2023-04-20 DIAGNOSIS — Z3201 Encounter for pregnancy test, result positive: Secondary | ICD-10-CM | POA: Diagnosis not present

## 2023-05-06 ENCOUNTER — Other Ambulatory Visit (HOSPITAL_COMMUNITY): Payer: Self-pay

## 2023-05-06 DIAGNOSIS — O99211 Obesity complicating pregnancy, first trimester: Secondary | ICD-10-CM | POA: Diagnosis not present

## 2023-05-06 DIAGNOSIS — Z113 Encounter for screening for infections with a predominantly sexual mode of transmission: Secondary | ICD-10-CM | POA: Diagnosis not present

## 2023-05-06 DIAGNOSIS — O09511 Supervision of elderly primigravida, first trimester: Secondary | ICD-10-CM | POA: Diagnosis not present

## 2023-05-06 DIAGNOSIS — Z3A08 8 weeks gestation of pregnancy: Secondary | ICD-10-CM | POA: Diagnosis not present

## 2023-05-06 DIAGNOSIS — O26891 Other specified pregnancy related conditions, first trimester: Secondary | ICD-10-CM | POA: Diagnosis not present

## 2023-05-06 DIAGNOSIS — Z368A Encounter for antenatal screening for other genetic defects: Secondary | ICD-10-CM | POA: Diagnosis not present

## 2023-05-06 DIAGNOSIS — O09519 Supervision of elderly primigravida, unspecified trimester: Secondary | ICD-10-CM | POA: Diagnosis not present

## 2023-05-06 DIAGNOSIS — Z369 Encounter for antenatal screening, unspecified: Secondary | ICD-10-CM | POA: Diagnosis not present

## 2023-05-06 MED ORDER — PRENATAL VITAMIN 27-0.8 MG PO TABS
1.0000 | ORAL_TABLET | Freq: Every day | ORAL | 3 refills | Status: DC
  Filled 2023-05-06: qty 100, 100d supply, fill #0
  Filled 2023-09-18: qty 100, 100d supply, fill #1

## 2023-05-25 ENCOUNTER — Ambulatory Visit: Payer: 59 | Admitting: Physician Assistant

## 2023-06-02 DIAGNOSIS — Z3A12 12 weeks gestation of pregnancy: Secondary | ICD-10-CM | POA: Diagnosis not present

## 2023-06-02 DIAGNOSIS — O09511 Supervision of elderly primigravida, first trimester: Secondary | ICD-10-CM | POA: Diagnosis not present

## 2023-06-02 DIAGNOSIS — Z3689 Encounter for other specified antenatal screening: Secondary | ICD-10-CM | POA: Diagnosis not present

## 2023-07-15 ENCOUNTER — Other Ambulatory Visit (HOSPITAL_COMMUNITY): Payer: Self-pay

## 2023-07-15 DIAGNOSIS — Z3A18 18 weeks gestation of pregnancy: Secondary | ICD-10-CM | POA: Diagnosis not present

## 2023-07-15 DIAGNOSIS — Z760 Encounter for issue of repeat prescription: Secondary | ICD-10-CM | POA: Diagnosis not present

## 2023-07-15 DIAGNOSIS — O99519 Diseases of the respiratory system complicating pregnancy, unspecified trimester: Secondary | ICD-10-CM | POA: Diagnosis not present

## 2023-07-15 DIAGNOSIS — O99212 Obesity complicating pregnancy, second trimester: Secondary | ICD-10-CM | POA: Diagnosis not present

## 2023-07-15 DIAGNOSIS — O09512 Supervision of elderly primigravida, second trimester: Secondary | ICD-10-CM | POA: Diagnosis not present

## 2023-07-15 DIAGNOSIS — Z363 Encounter for antenatal screening for malformations: Secondary | ICD-10-CM | POA: Diagnosis not present

## 2023-07-15 DIAGNOSIS — O283 Abnormal ultrasonic finding on antenatal screening of mother: Secondary | ICD-10-CM | POA: Diagnosis not present

## 2023-07-15 MED ORDER — PRENATAL VITAMIN 27-0.8 MG PO TABS
1.0000 | ORAL_TABLET | Freq: Every day | ORAL | 3 refills | Status: DC
  Filled 2023-07-15: qty 100, 100d supply, fill #0

## 2023-07-15 MED ORDER — LEVOCETIRIZINE DIHYDROCHLORIDE 5 MG PO TABS
5.0000 mg | ORAL_TABLET | Freq: Every day | ORAL | 3 refills | Status: DC | PRN
  Filled 2023-07-15: qty 90, 90d supply, fill #0
  Filled 2023-10-18: qty 90, 90d supply, fill #1

## 2023-07-16 ENCOUNTER — Other Ambulatory Visit: Payer: Self-pay | Admitting: Obstetrics and Gynecology

## 2023-07-16 DIAGNOSIS — Z363 Encounter for antenatal screening for malformations: Secondary | ICD-10-CM

## 2023-07-16 DIAGNOSIS — O283 Abnormal ultrasonic finding on antenatal screening of mother: Secondary | ICD-10-CM

## 2023-07-19 DIAGNOSIS — O359XX0 Maternal care for (suspected) fetal abnormality and damage, unspecified, not applicable or unspecified: Secondary | ICD-10-CM | POA: Diagnosis not present

## 2023-08-09 ENCOUNTER — Other Ambulatory Visit: Payer: Self-pay | Admitting: Physician Assistant

## 2023-08-09 DIAGNOSIS — H93A1 Pulsatile tinnitus, right ear: Secondary | ICD-10-CM

## 2023-08-10 ENCOUNTER — Other Ambulatory Visit: Payer: 59

## 2023-08-10 ENCOUNTER — Ambulatory Visit: Payer: 59

## 2023-08-12 DIAGNOSIS — Z3400 Encounter for supervision of normal first pregnancy, unspecified trimester: Secondary | ICD-10-CM | POA: Diagnosis not present

## 2023-08-12 DIAGNOSIS — Z3A00 Weeks of gestation of pregnancy not specified: Secondary | ICD-10-CM | POA: Diagnosis not present

## 2023-08-20 DIAGNOSIS — O09522 Supervision of elderly multigravida, second trimester: Secondary | ICD-10-CM | POA: Diagnosis not present

## 2023-08-20 DIAGNOSIS — Z3A24 24 weeks gestation of pregnancy: Secondary | ICD-10-CM | POA: Diagnosis not present

## 2023-08-20 DIAGNOSIS — O0992 Supervision of high risk pregnancy, unspecified, second trimester: Secondary | ICD-10-CM | POA: Diagnosis not present

## 2023-08-20 DIAGNOSIS — O35BXX0 Maternal care for other (suspected) fetal abnormality and damage, fetal cardiac anomalies, not applicable or unspecified: Secondary | ICD-10-CM | POA: Diagnosis not present

## 2023-08-23 ENCOUNTER — Other Ambulatory Visit (HOSPITAL_COMMUNITY): Payer: Self-pay

## 2023-08-23 MED ORDER — ASPIRIN 81 MG PO TBEC
81.0000 mg | DELAYED_RELEASE_TABLET | Freq: Every day | ORAL | 2 refills | Status: DC
  Filled 2023-08-23: qty 30, 30d supply, fill #0
  Filled 2023-09-18: qty 30, 30d supply, fill #1
  Filled 2023-10-18: qty 30, 30d supply, fill #2

## 2023-08-29 DIAGNOSIS — Z3482 Encounter for supervision of other normal pregnancy, second trimester: Secondary | ICD-10-CM | POA: Diagnosis not present

## 2023-08-29 DIAGNOSIS — Z3483 Encounter for supervision of other normal pregnancy, third trimester: Secondary | ICD-10-CM | POA: Diagnosis not present

## 2023-09-20 ENCOUNTER — Other Ambulatory Visit (HOSPITAL_BASED_OUTPATIENT_CLINIC_OR_DEPARTMENT_OTHER): Payer: Self-pay

## 2023-09-21 ENCOUNTER — Other Ambulatory Visit (HOSPITAL_BASED_OUTPATIENT_CLINIC_OR_DEPARTMENT_OTHER): Payer: Self-pay

## 2023-09-21 DIAGNOSIS — O35BXX0 Maternal care for other (suspected) fetal abnormality and damage, fetal cardiac anomalies, not applicable or unspecified: Secondary | ICD-10-CM | POA: Diagnosis not present

## 2023-09-21 DIAGNOSIS — G5600 Carpal tunnel syndrome, unspecified upper limb: Secondary | ICD-10-CM | POA: Diagnosis not present

## 2023-09-21 DIAGNOSIS — O0992 Supervision of high risk pregnancy, unspecified, second trimester: Secondary | ICD-10-CM | POA: Diagnosis not present

## 2023-09-21 DIAGNOSIS — O09513 Supervision of elderly primigravida, third trimester: Secondary | ICD-10-CM | POA: Diagnosis not present

## 2023-09-21 DIAGNOSIS — O10919 Unspecified pre-existing hypertension complicating pregnancy, unspecified trimester: Secondary | ICD-10-CM | POA: Diagnosis not present

## 2023-09-21 DIAGNOSIS — R7303 Prediabetes: Secondary | ICD-10-CM | POA: Diagnosis not present

## 2023-09-21 DIAGNOSIS — Z3A28 28 weeks gestation of pregnancy: Secondary | ICD-10-CM | POA: Diagnosis not present

## 2023-09-21 DIAGNOSIS — Z3689 Encounter for other specified antenatal screening: Secondary | ICD-10-CM | POA: Diagnosis not present

## 2023-10-19 ENCOUNTER — Other Ambulatory Visit (HOSPITAL_BASED_OUTPATIENT_CLINIC_OR_DEPARTMENT_OTHER): Payer: Self-pay

## 2023-10-19 DIAGNOSIS — O10913 Unspecified pre-existing hypertension complicating pregnancy, third trimester: Secondary | ICD-10-CM | POA: Diagnosis not present

## 2023-10-19 DIAGNOSIS — Z3A32 32 weeks gestation of pregnancy: Secondary | ICD-10-CM | POA: Diagnosis not present

## 2023-10-19 DIAGNOSIS — O99893 Other specified diseases and conditions complicating puerperium: Secondary | ICD-10-CM | POA: Diagnosis not present

## 2023-10-19 DIAGNOSIS — O35BXX0 Maternal care for other (suspected) fetal abnormality and damage, fetal cardiac anomalies, not applicable or unspecified: Secondary | ICD-10-CM | POA: Diagnosis not present

## 2023-10-19 DIAGNOSIS — R7303 Prediabetes: Secondary | ICD-10-CM | POA: Diagnosis not present

## 2023-10-19 DIAGNOSIS — G5600 Carpal tunnel syndrome, unspecified upper limb: Secondary | ICD-10-CM | POA: Diagnosis not present

## 2023-10-19 DIAGNOSIS — O99353 Diseases of the nervous system complicating pregnancy, third trimester: Secondary | ICD-10-CM | POA: Diagnosis not present

## 2023-10-19 MED ORDER — FAMOTIDINE 20 MG PO TABS
20.0000 mg | ORAL_TABLET | Freq: Two times a day (BID) | ORAL | 11 refills | Status: DC
  Filled 2023-10-19: qty 60, 30d supply, fill #0

## 2023-10-20 ENCOUNTER — Other Ambulatory Visit (HOSPITAL_BASED_OUTPATIENT_CLINIC_OR_DEPARTMENT_OTHER): Payer: Self-pay

## 2023-10-20 MED ORDER — POTASSIUM CHLORIDE CRYS ER 20 MEQ PO TBCR
20.0000 meq | EXTENDED_RELEASE_TABLET | Freq: Two times a day (BID) | ORAL | 11 refills | Status: DC
  Filled 2023-10-20: qty 60, 30d supply, fill #0

## 2023-10-27 ENCOUNTER — Observation Stay (HOSPITAL_COMMUNITY): Payer: 59

## 2023-10-27 ENCOUNTER — Observation Stay (HOSPITAL_COMMUNITY)
Admission: AD | Admit: 2023-10-27 | Discharge: 2023-10-28 | Disposition: A | Discharge status: Home / Self Care | Payer: 59 | Attending: Obstetrics and Gynecology | Admitting: Obstetrics and Gynecology

## 2023-10-27 ENCOUNTER — Encounter (HOSPITAL_COMMUNITY): Payer: Self-pay | Admitting: Obstetrics and Gynecology

## 2023-10-27 DIAGNOSIS — O99213 Obesity complicating pregnancy, third trimester: Secondary | ICD-10-CM | POA: Diagnosis not present

## 2023-10-27 DIAGNOSIS — O09513 Supervision of elderly primigravida, third trimester: Secondary | ICD-10-CM | POA: Insufficient documentation

## 2023-10-27 DIAGNOSIS — O1413 Severe pre-eclampsia, third trimester: Principal | ICD-10-CM

## 2023-10-27 DIAGNOSIS — O35BXX0 Maternal care for other (suspected) fetal abnormality and damage, fetal cardiac anomalies, not applicable or unspecified: Secondary | ICD-10-CM | POA: Diagnosis not present

## 2023-10-27 DIAGNOSIS — Z3A33 33 weeks gestation of pregnancy: Secondary | ICD-10-CM | POA: Insufficient documentation

## 2023-10-27 DIAGNOSIS — O1493 Unspecified pre-eclampsia, third trimester: Secondary | ICD-10-CM

## 2023-10-27 DIAGNOSIS — O163 Unspecified maternal hypertension, third trimester: Principal | ICD-10-CM | POA: Insufficient documentation

## 2023-10-27 DIAGNOSIS — O09523 Supervision of elderly multigravida, third trimester: Secondary | ICD-10-CM

## 2023-10-27 DIAGNOSIS — O1490 Unspecified pre-eclampsia, unspecified trimester: Secondary | ICD-10-CM | POA: Diagnosis present

## 2023-10-27 DIAGNOSIS — O1203 Gestational edema, third trimester: Secondary | ICD-10-CM | POA: Diagnosis not present

## 2023-10-27 DIAGNOSIS — E669 Obesity, unspecified: Secondary | ICD-10-CM | POA: Diagnosis not present

## 2023-10-27 LAB — COMPREHENSIVE METABOLIC PANEL
ALT: 16 U/L (ref 0–44)
AST: 25 U/L (ref 15–41)
Albumin: 2.8 g/dL — ABNORMAL LOW (ref 3.5–5.0)
Alkaline Phosphatase: 78 U/L (ref 38–126)
Anion gap: 9 (ref 5–15)
BUN: 7 mg/dL (ref 6–20)
CO2: 22 mmol/L (ref 22–32)
Calcium: 9.6 mg/dL (ref 8.9–10.3)
Chloride: 104 mmol/L (ref 98–111)
Creatinine, Ser: 0.48 mg/dL (ref 0.44–1.00)
GFR, Estimated: >60 mL/min (ref >60)
Glucose, Bld: 88 mg/dL (ref 70–99)
Potassium: 4.3 mmol/L (ref 3.5–5.1)
Sodium: 135 mmol/L (ref 135–145)
Total Bilirubin: 0.3 mg/dL (ref <1.2)
Total Protein: 6.3 g/dL — ABNORMAL LOW (ref 6.5–8.1)

## 2023-10-27 LAB — TYPE AND SCREEN
ABO/RH(D): O POS
Antibody Screen: NEGATIVE

## 2023-10-27 LAB — CBC
HCT: 36.3 % (ref 36.0–46.0)
Hemoglobin: 12.1 g/dL (ref 12.0–15.0)
MCH: 30.7 pg (ref 26.0–34.0)
MCHC: 33.3 g/dL (ref 30.0–36.0)
MCV: 92.1 fL (ref 80.0–100.0)
Platelets: 215 10*3/uL (ref 150–400)
RBC: 3.94 MIL/uL (ref 3.87–5.11)
RDW: 13.9 % (ref 11.5–15.5)
WBC: 12.6 10*3/uL — ABNORMAL HIGH (ref 4.0–10.5)
nRBC: 0 % (ref 0.0–0.2)

## 2023-10-27 LAB — PROTEIN / CREATININE RATIO, URINE
Creatinine, Urine: 71 mg/dL
Protein Creatinine Ratio: 0.11 mg/mg{creat} (ref 0.00–0.15)
Total Protein, Urine: 8 mg/dL

## 2023-10-27 MED ORDER — LABETALOL HCL 5 MG/ML IV SOLN
20.0000 mg | INTRAVENOUS | Status: DC | PRN
  Administered 2023-10-27: 20 mg via INTRAVENOUS

## 2023-10-27 MED ORDER — LACTATED RINGERS IV BOLUS
1000.0000 mL | Freq: Once | INTRAVENOUS | Status: AC
  Administered 2023-10-27: 1000 mL via INTRAVENOUS

## 2023-10-27 MED ORDER — HYDRALAZINE HCL 20 MG/ML IJ SOLN: 10.0000 mg | INTRAMUSCULAR | Status: DC | PRN

## 2023-10-27 MED ORDER — LACTATED RINGERS IV SOLN: INTRAVENOUS | Status: DC

## 2023-10-27 MED ORDER — LACTATED RINGERS IV SOLN: 125.0000 mL/h | INTRAVENOUS | Status: DC

## 2023-10-27 MED ORDER — CALCIUM CARBONATE ANTACID 500 MG PO CHEW
2.0000 | CHEWABLE_TABLET | ORAL | Status: DC | PRN
  Administered 2023-10-28: 400 mg via ORAL
  Filled 2023-10-27: qty 2

## 2023-10-27 MED ORDER — MAGNESIUM SULFATE BOLUS VIA INFUSION
4.0000 g | Freq: Once | INTRAVENOUS | Status: AC
  Administered 2023-10-27: 4 g via INTRAVENOUS
  Filled 2023-10-27: qty 1000

## 2023-10-27 MED ORDER — LABETALOL HCL 5 MG/ML IV SOLN: 40.0000 mg | INTRAVENOUS | Status: DC | PRN

## 2023-10-27 MED ORDER — ONDANSETRON HCL 4 MG/2ML IJ SOLN
4.0000 mg | Freq: Two times a day (BID) | INTRAMUSCULAR | Status: DC | PRN
  Administered 2023-10-27: 4 mg via INTRAVENOUS
  Filled 2023-10-27: qty 2

## 2023-10-27 MED ORDER — LABETALOL HCL 5 MG/ML IV SOLN: 80.0000 mg | INTRAVENOUS | Status: DC | PRN

## 2023-10-27 MED ORDER — PRENATAL MULTIVITAMIN CH: 1.0000 | ORAL_TABLET | Freq: Every day | ORAL | Status: DC

## 2023-10-27 MED ORDER — ALUM & MAG HYDROXIDE-SIMETH 200-200-20 MG/5ML PO SUSP
30.0000 mL | Freq: Once | ORAL | Status: AC
  Administered 2023-10-27: 30 mL via ORAL
  Filled 2023-10-27: qty 30

## 2023-10-27 MED ORDER — LABETALOL HCL 5 MG/ML IV SOLN: 20.0000 mg | INTRAVENOUS | Status: DC | PRN

## 2023-10-27 MED ORDER — ACETAMINOPHEN 325 MG PO TABS
650.0000 mg | ORAL_TABLET | ORAL | Status: DC | PRN
  Administered 2023-10-28: 650 mg via ORAL
  Filled 2023-10-27: qty 2

## 2023-10-27 MED ORDER — DOCUSATE SODIUM 100 MG PO CAPS: 100.0000 mg | ORAL_CAPSULE | Freq: Every day | ORAL | Status: DC

## 2023-10-27 MED ORDER — BETAMETHASONE SOD PHOS & ACET 6 (3-3) MG/ML IJ SUSP
12.0000 mg | INTRAMUSCULAR | Status: DC
  Administered 2023-10-27: 12 mg via INTRAMUSCULAR
  Filled 2023-10-27: qty 5

## 2023-10-27 MED ORDER — LABETALOL HCL 5 MG/ML IV SOLN
INTRAVENOUS | Status: AC
  Filled 2023-10-27: qty 4

## 2023-10-27 MED ORDER — MAGNESIUM SULFATE 40 GM/1000ML IV SOLN
2.0000 g/h | INTRAVENOUS | Status: DC
  Administered 2023-10-27: 2 g/h via INTRAVENOUS
  Filled 2023-10-27: qty 1000

## 2023-10-27 NOTE — MAU Provider Note (Cosign Needed Addendum)
History     CSN: [id number]  Arrival date and time: 10/27/23 6440   Event Date/Time   First Provider Initiated Contact with Patient 10/27/23 2009      Chief Complaint  Patient presents with   Hypertension   HPI Ms. Kayla Campos is a 37 y.o. year old G47P0010 female at [redacted]w[redacted]d weeks gestation who was sent to MAU by Dr. Reina Fuse for elevated BPs and increased swelling while at work (patient is a Designer, industrial/product here). 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 Her partner is present and contributing to the history taking.   OB History     Gravida  2   Para      Term      Preterm      AB  1   Living         SAB  1   IAB      Ectopic      Multiple      Live Births              Past Medical History:  Diagnosis Date   Alopecia    per patient    Asthma    Eczema    Hypertension    Insulin resistance    per patient    Pleurisy    PNA (pneumonia)    Urticaria    Vitiligo    per patient, dx by derm    Past Surgical History:  Procedure Laterality Date   WISDOM TOOTH EXTRACTION  2008    Family History  Problem Relation Age of Onset   Hypertension Mother    Hypothyroidism Mother    Diabetes Father    Hypertension Father    Coronary artery disease Father    Hyperlipidemia Father    Congestive Heart Failure Father    Esophageal cancer Father    Hypertension Sister    Allergic rhinitis Sister    Eczema Sister    Urticaria Sister    IgA nephropathy Sister    Diabetes Sister    Healthy Sister    Healthy Brother    Hyperlipidemia Maternal Aunt    Heart attack Maternal Grandmother    Heart attack Maternal Grandfather    Diabetes Maternal Grandfather    Stroke Maternal Grandfather    Cancer Paternal Grandmother    Heart attack Paternal Grandmother    Diabetes Paternal Grandmother    Asthma Neg Hx     Angioedema Neg Hx     Social History   Tobacco Use   Smoking status: Never   Smokeless tobacco: Never  Vaping Use   Vaping status: Never Used  Substance Use Topics   Alcohol use: No   Drug use: No    Allergies:  Allergies  Allergen Reactions   Keflex [Cephalexin] Hives    Medications Prior to Admission  Medication Sig Dispense Refill Last Dose/Taking   aspirin EC (ASPIRIN 81) 81 MG tablet Take 1 tablet (81 mg total) by mouth daily. 30 tablet 2 10/26/2023 Bedtime   famotidine (PEPCID) 20 MG tablet Take 1 tablet (20 mg total) by mouth 2 (two) times daily. 60 tablet 11 10/26/2023 Bedtime   levocetirizine (XYZAL) 5 MG tablet Take 1 tablet (5 mg total) by mouth daily as needed for allergies 90 tablet 2 10/26/2023 Bedtime   potassium chloride SA (KLOR-CON M) 20 MEQ tablet  Take 1 tablet (20 mEq total) by mouth 2 (two) times daily 60 tablet 11 10/27/2023 Morning   Prenatal Vit-Fe Fumarate-FA (PRENATAL VITAMIN) 27-0.8 MG TABS Take 1 tablet by mouth daily. 100 tablet 3 10/26/2023 Bedtime   albuterol (VENTOLIN HFA) 108 (90 Base) MCG/ACT inhaler Inhale 2 puffs into the lungs every 6 (six) hours as needed for wheezing or shortness of breath. 6.7 g 0    Biotin 82956 MCG TABS Take by mouth.      levocetirizine (XYZAL) 5 MG tablet Take 1 tablet (5 mg total) by mouth daily as needed. 90 tablet 3    levonorgestrel-ethinyl estradiol (AVIANE) 0.1-20 MG-MCG tablet Take 1 tablet by mouth daily. 84 tablet 3    metFORMIN (GLUCOPHAGE-XR) 500 MG 24 hr tablet Take 1 tablet (500 mg total) by mouth 2 (two) times daily 60 tablet 5    montelukast (SINGULAIR) 10 MG tablet Take 1 tablet (10 mg total) by mouth at bedtime. 90 tablet 3    nystatin ointment (MYCOSTATIN) Apply 1 Application topically 2 (two) times daily. Into areas where rash is present. 30 g 1    nystatin powder Apply 1 Application topically 3 (three) times daily. 60 g 5    omeprazole (PRILOSEC) 40 MG capsule Take 1 capsule (40 mg total) by mouth  daily. 90 capsule 3    ondansetron (ZOFRAN-ODT) 8 MG disintegrating tablet Take 1 tablet (8 mg total) by mouth every 8 (eight) hours as needed for nausea. 20 tablet 3    Prenatal Vit-Fe Fumarate-FA (PRENATAL VITAMIN) 27-0.8 MG TABS Take 1 tablet by mouth daily. 100 tablet 3    tirzepatide (MOUNJARO) 7.5 MG/0.5ML Pen Inject 7.5 mg into the skin once a week. 6 mL 0     Review of Systems  Constitutional: Negative.   HENT: Negative.    Eyes: Negative.   Respiratory: Negative.    Cardiovascular:  Positive for leg swelling.  Gastrointestinal: Negative.   Endocrine: Negative.   Genitourinary: Negative.   Musculoskeletal: Negative.   Skin: Negative.   Allergic/Immunologic: Negative.   Neurological: Negative.   Hematological: Negative.   Psychiatric/Behavioral: Negative.     Physical Exam   Patient Vitals for the past 24 hrs:  BP Temp Temp src Pulse Resp SpO2 Height Weight  10/27/23 2045 (!) 156/90 -- -- 81 -- 99 % -- --  10/27/23 2040 -- -- -- -- -- 99 % -- --  10/27/23 2035 -- -- -- -- -- 100 % -- --  10/27/23 2030 (!) 147/86 -- -- 80 -- 99 % -- --  10/27/23 2025 (!) 142/92 -- -- 79 -- 99 % -- --  10/27/23 2020 -- -- -- -- -- 99 % -- --  10/27/23 2015 -- -- -- -- -- 100 % -- --  10/27/23 2013 (!) 160/101 -- -- 87 -- -- -- --  10/27/23 2010 -- -- -- -- -- 100 % -- --  10/27/23 1955 (!) 161/98 -- -- 89 -- 99 % -- --  10/27/23 1945 (!) 151/87 97.7 F (36.5 C) Oral 86 17 100 % 5\' 6"  (1.676 m) 131.1 kg   Physical Exam Vitals and nursing note reviewed. Exam conducted with a chaperone present.  Constitutional:      Appearance: Normal appearance. She is obese.  HENT:     Head: Normocephalic and atraumatic.  Cardiovascular:     Rate and Rhythm: Normal rate.  Pulmonary:     Effort: Pulmonary effort is normal.  Abdominal:     Palpations: Abdomen is  soft.  Musculoskeletal:        General: Swelling present. Normal range of motion.     Right lower leg: Edema (2++ pitting edema)  present.     Left lower leg: Edema (2++ pitting edema) present.  Skin:    General: Skin is warm and dry.  Neurological:     Mental Status: She is alert and oriented to person, place, and time.  Psychiatric:        Mood and Affect: Mood normal.        Behavior: Behavior normal.        Thought Content: Thought content normal.        Judgment: Judgment normal.    FHR: 180 bpm / moderate variability / accels present / decels absent / TOCO: none  MAU Course  Procedures  MDM CCUA CBC CMP RPR P/C Ratio Serial BP's  Start IV LR bolus Labetalol Protocol   Results for orders placed or performed during the hospital encounter of 10/27/23 (from the past 24 hours)  CBC     Status: Abnormal   Collection Time: 10/27/23  8:06 PM  Result Value Ref Range   WBC 12.6 (H) 4.0 - 10.5 K/uL   RBC 3.94 3.87 - 5.11 MIL/uL   Hemoglobin 12.1 12.0 - 15.0 g/dL   HCT 57.8 46.9 - 62.9 %   MCV 92.1 80.0 - 100.0 fL   MCH 30.7 26.0 - 34.0 pg   MCHC 33.3 30.0 - 36.0 g/dL   RDW 52.8 41.3 - 24.4 %   Platelets 215 150 - 400 K/uL   nRBC 0.0 0.0 - 0.2 %  Comprehensive metabolic panel     Status: Abnormal   Collection Time: 10/27/23  8:06 PM  Result Value Ref Range   Sodium 135 135 - 145 mmol/L   Potassium 4.3 3.5 - 5.1 mmol/L   Chloride 104 98 - 111 mmol/L   CO2 22 22 - 32 mmol/L   Glucose, Bld 88 70 - 99 mg/dL   BUN 7 6 - 20 mg/dL   Creatinine, Ser 0.10 0.44 - 1.00 mg/dL   Calcium 9.6 8.9 - 27.2 mg/dL   Total Protein 6.3 (L) 6.5 - 8.1 g/dL   Albumin 2.8 (L) 3.5 - 5.0 g/dL   AST 25 15 - 41 U/L   ALT 16 0 - 44 U/L   Alkaline Phosphatase 78 38 - 126 U/L   Total Bilirubin 0.3 <1.2 mg/dL   GFR, Estimated >53 >66 mL/min   Anion gap 9 5 - 15     *TC to Dr. Nathen May @ 2009 - notified of patient's complaints, assessments, BPs (1 elevated and severe range) & NST results -- calling to see if she wanted MgSO4 started now  In surgery, will call back  Report given to and care assumed by Wynelle Bourgeois,  CNM @ 2100  Raelyn Mora, CNM 10/27/2023, 8:09 PM  Assessment and Plan  Dr Reina Fuse updated Plan to admit overnight then arrange transfer to [facility] Will give Magnesium Sulfate and Betamethasone Dr Reina Fuse to assume care  Aviva Signs, CNM

## 2023-10-27 NOTE — MAU Note (Addendum)
..  Kayla Campos is a 37 y.o. at [redacted]w[redacted]d here in MAU reporting:  Denies headache, blurry vision, or epigastric pain.  Reports lower extremity edema since 20 weeks.  Took blood pressure at work 145/95, was told to come here    Pain score: 0/10 Vitals:   10/27/23 1945  BP: (!) 151/87  Pulse: 86  Resp: 17  Temp: 97.7 F (36.5 C)  SpO2: 100%     FHT:180 in triage and brought straight to room  Lab orders placed from triage:

## 2023-10-28 ENCOUNTER — Other Ambulatory Visit: Payer: Self-pay

## 2023-10-28 DIAGNOSIS — O1413 Severe pre-eclampsia, third trimester: Secondary | ICD-10-CM | POA: Diagnosis not present

## 2023-10-28 DIAGNOSIS — J45909 Unspecified asthma, uncomplicated: Secondary | ICD-10-CM | POA: Diagnosis not present

## 2023-10-28 DIAGNOSIS — F419 Anxiety disorder, unspecified: Secondary | ICD-10-CM | POA: Diagnosis not present

## 2023-10-28 DIAGNOSIS — O99344 Other mental disorders complicating childbirth: Secondary | ICD-10-CM | POA: Diagnosis not present

## 2023-10-28 DIAGNOSIS — O09513 Supervision of elderly primigravida, third trimester: Secondary | ICD-10-CM | POA: Diagnosis not present

## 2023-10-28 DIAGNOSIS — O35BXX0 Maternal care for other (suspected) fetal abnormality and damage, fetal cardiac anomalies, not applicable or unspecified: Secondary | ICD-10-CM | POA: Diagnosis not present

## 2023-10-28 DIAGNOSIS — O9952 Diseases of the respiratory system complicating childbirth: Secondary | ICD-10-CM | POA: Diagnosis not present

## 2023-10-28 DIAGNOSIS — O1203 Gestational edema, third trimester: Secondary | ICD-10-CM | POA: Diagnosis not present

## 2023-10-28 DIAGNOSIS — O99214 Obesity complicating childbirth: Secondary | ICD-10-CM | POA: Diagnosis not present

## 2023-10-28 DIAGNOSIS — Z3A34 34 weeks gestation of pregnancy: Secondary | ICD-10-CM | POA: Diagnosis not present

## 2023-10-28 DIAGNOSIS — O1002 Pre-existing essential hypertension complicating childbirth: Secondary | ICD-10-CM | POA: Diagnosis not present

## 2023-10-28 DIAGNOSIS — O114 Pre-existing hypertension with pre-eclampsia, complicating childbirth: Secondary | ICD-10-CM | POA: Diagnosis not present

## 2023-10-28 DIAGNOSIS — E6689 Other obesity not elsewhere classified: Secondary | ICD-10-CM | POA: Diagnosis not present

## 2023-10-28 DIAGNOSIS — E669 Obesity, unspecified: Secondary | ICD-10-CM | POA: Diagnosis not present

## 2023-10-28 DIAGNOSIS — O163 Unspecified maternal hypertension, third trimester: Secondary | ICD-10-CM | POA: Diagnosis not present

## 2023-10-28 DIAGNOSIS — O1414 Severe pre-eclampsia complicating childbirth: Secondary | ICD-10-CM | POA: Diagnosis not present

## 2023-10-28 DIAGNOSIS — O99824 Streptococcus B carrier state complicating childbirth: Secondary | ICD-10-CM | POA: Diagnosis not present

## 2023-10-28 DIAGNOSIS — Z3A33 33 weeks gestation of pregnancy: Secondary | ICD-10-CM | POA: Diagnosis not present

## 2023-10-28 DIAGNOSIS — J4520 Mild intermittent asthma, uncomplicated: Secondary | ICD-10-CM | POA: Diagnosis not present

## 2023-10-28 DIAGNOSIS — O321XX0 Maternal care for breech presentation, not applicable or unspecified: Secondary | ICD-10-CM | POA: Diagnosis not present

## 2023-10-28 NOTE — Progress Notes (Signed)
Pt stable from a pre-eclampsia standpoint. FHR appropriate for gestational age. Benefits outweigh risks of transfer.

## 2023-10-28 NOTE — Discharge Summary (Signed)
Kayla Campos is a 37yo G2P0010 @ 33 5/7 who presented to MAU on the evening of 10/27/23 with new onset severe-range blood pressures and significant LE edema (2+ pitting up to BL knees). CBC/CMP, uPC all grossly WNL. Magnesium sulfate was initiated and first dose of BMTZ was given. Patient with fetus with known significant cardiac issues requiring staged surgery at [facility] after birth. No labor signs, +FM, denied VB, LOF.   FHR cat 2 initally with baseline 180s-190s, min to mod var, no accels or decels but improved to Cat with baseline 130, moderate variability, 10x10 accels present, no decels prior to transfer.   BPP showed 8/8 and growth scan showed EFW 94th% and AC 99th%. [facility] recommended transfer to [facility] facility. She was stable and transferred to [facility] by EMS the morning of 10/28/2023.

## 2023-10-28 NOTE — Progress Notes (Signed)
Pt transferred by [facility] and stable baby appropriate on monitor before removal

## 2023-11-02 ENCOUNTER — Other Ambulatory Visit: Payer: Self-pay

## 2023-11-02 ENCOUNTER — Other Ambulatory Visit (HOSPITAL_BASED_OUTPATIENT_CLINIC_OR_DEPARTMENT_OTHER): Payer: Self-pay

## 2023-11-02 MED ORDER — POLYETHYLENE GLYCOL 3350 17 G PO PACK
PACK | ORAL | 0 refills | Status: DC
  Filled 2023-11-02: qty 14, 14d supply, fill #0

## 2023-11-02 MED ORDER — ACETAMINOPHEN 325 MG PO TABS
ORAL_TABLET | ORAL | 0 refills | Status: DC
  Filled 2023-11-02: qty 100, 10d supply, fill #0

## 2023-11-02 MED ORDER — NIFEDIPINE ER 60 MG PO TB24
60.0000 mg | ORAL_TABLET | Freq: Every day | ORAL | 0 refills | Status: DC
  Filled 2023-11-02: qty 30, 30d supply, fill #0

## 2023-11-02 MED ORDER — DOCUSATE SODIUM 100 MG PO CAPS
ORAL_CAPSULE | ORAL | 0 refills | Status: DC
  Filled 2023-11-02: qty 100, 50d supply, fill #0

## 2023-11-02 MED ORDER — IBUPROFEN 600 MG PO TABS
ORAL_TABLET | ORAL | 0 refills | Status: DC
  Filled 2023-11-02: qty 30, 8d supply, fill #0

## 2023-11-02 MED ORDER — PRENATAL 27-1 MG PO TABS
1.0000 | ORAL_TABLET | Freq: Every day | ORAL | 0 refills | Status: DC
  Filled 2023-11-02: qty 30, 30d supply, fill #0

## 2023-11-02 MED ORDER — ENOXAPARIN SODIUM 60 MG/0.6ML IJ SOSY
PREFILLED_SYRINGE | INTRAMUSCULAR | 0 refills | Status: DC
  Filled 2023-11-02: qty 8.4, 14d supply, fill #0

## 2023-11-08 ENCOUNTER — Other Ambulatory Visit (HOSPITAL_BASED_OUTPATIENT_CLINIC_OR_DEPARTMENT_OTHER): Payer: Self-pay

## 2023-11-08 DIAGNOSIS — Z309 Encounter for contraceptive management, unspecified: Secondary | ICD-10-CM | POA: Diagnosis not present

## 2023-11-08 DIAGNOSIS — L304 Erythema intertrigo: Secondary | ICD-10-CM | POA: Diagnosis not present

## 2023-11-08 DIAGNOSIS — Z4889 Encounter for other specified surgical aftercare: Secondary | ICD-10-CM | POA: Diagnosis not present

## 2023-11-08 MED ORDER — NYSTATIN 100000 UNIT/GM EX CREA
TOPICAL_CREAM | Freq: Two times a day (BID) | CUTANEOUS | 1 refills | Status: AC
  Filled 2023-11-08: qty 15, 30d supply, fill #0
  Filled 2023-12-06 – 2023-12-20 (×2): qty 15, 30d supply, fill #1

## 2023-11-08 MED ORDER — NORETHINDRONE 0.35 MG PO TABS
1.0000 | ORAL_TABLET | Freq: Every day | ORAL | 4 refills | Status: DC
  Filled 2023-11-08: qty 28, 28d supply, fill #0
  Filled 2023-12-06 – 2023-12-20 (×2): qty 28, 28d supply, fill #1

## 2023-11-29 ENCOUNTER — Other Ambulatory Visit (HOSPITAL_BASED_OUTPATIENT_CLINIC_OR_DEPARTMENT_OTHER): Payer: Self-pay

## 2023-11-29 MED ORDER — FLUOXETINE HCL 10 MG PO CAPS
10.0000 mg | ORAL_CAPSULE | Freq: Every day | ORAL | 1 refills | Status: DC
  Filled 2023-11-29: qty 90, 90d supply, fill #0

## 2023-11-29 MED ORDER — NIFEDIPINE ER OSMOTIC RELEASE 60 MG PO TB24
60.0000 mg | ORAL_TABLET | Freq: Every day | ORAL | 1 refills | Status: DC
  Filled 2023-11-29: qty 30, 30d supply, fill #0

## 2023-11-29 MED ORDER — FAMOTIDINE 20 MG PO TABS
20.0000 mg | ORAL_TABLET | Freq: Two times a day (BID) | ORAL | 1 refills | Status: DC
  Filled 2023-11-29: qty 60, 30d supply, fill #0
  Filled 2024-01-19: qty 60, 30d supply, fill #1

## 2023-11-30 ENCOUNTER — Other Ambulatory Visit (HOSPITAL_BASED_OUTPATIENT_CLINIC_OR_DEPARTMENT_OTHER): Payer: Self-pay

## 2023-11-30 MED ORDER — LABETALOL HCL 200 MG PO TABS
200.0000 mg | ORAL_TABLET | Freq: Every day | ORAL | 2 refills | Status: DC
  Filled 2023-11-30: qty 30, 30d supply, fill #0

## 2023-12-06 ENCOUNTER — Other Ambulatory Visit (HOSPITAL_BASED_OUTPATIENT_CLINIC_OR_DEPARTMENT_OTHER): Payer: Self-pay

## 2023-12-06 ENCOUNTER — Other Ambulatory Visit: Payer: Self-pay

## 2023-12-08 DIAGNOSIS — R072 Precordial pain: Secondary | ICD-10-CM | POA: Diagnosis not present

## 2023-12-08 DIAGNOSIS — I10 Essential (primary) hypertension: Secondary | ICD-10-CM | POA: Diagnosis not present

## 2023-12-08 DIAGNOSIS — Z3009 Encounter for other general counseling and advice on contraception: Secondary | ICD-10-CM | POA: Diagnosis not present

## 2023-12-08 DIAGNOSIS — Z1389 Encounter for screening for other disorder: Secondary | ICD-10-CM | POA: Diagnosis not present

## 2023-12-09 ENCOUNTER — Other Ambulatory Visit (HOSPITAL_BASED_OUTPATIENT_CLINIC_OR_DEPARTMENT_OTHER): Payer: Self-pay

## 2023-12-16 ENCOUNTER — Other Ambulatory Visit (HOSPITAL_BASED_OUTPATIENT_CLINIC_OR_DEPARTMENT_OTHER): Payer: Self-pay

## 2023-12-17 ENCOUNTER — Other Ambulatory Visit (HOSPITAL_BASED_OUTPATIENT_CLINIC_OR_DEPARTMENT_OTHER): Payer: Self-pay

## 2023-12-17 MED ORDER — LABETALOL HCL 200 MG PO TABS
200.0000 mg | ORAL_TABLET | Freq: Two times a day (BID) | ORAL | 2 refills | Status: DC
  Filled 2023-12-17 – 2023-12-20 (×2): qty 60, 30d supply, fill #0
  Filled 2024-01-19: qty 60, 30d supply, fill #1

## 2023-12-20 ENCOUNTER — Other Ambulatory Visit (HOSPITAL_BASED_OUTPATIENT_CLINIC_OR_DEPARTMENT_OTHER): Payer: Self-pay

## 2023-12-20 DIAGNOSIS — I10 Essential (primary) hypertension: Secondary | ICD-10-CM | POA: Diagnosis not present

## 2024-01-03 ENCOUNTER — Other Ambulatory Visit (HOSPITAL_BASED_OUTPATIENT_CLINIC_OR_DEPARTMENT_OTHER): Payer: Self-pay

## 2024-01-03 DIAGNOSIS — I10 Essential (primary) hypertension: Secondary | ICD-10-CM | POA: Diagnosis not present

## 2024-01-03 DIAGNOSIS — Z3009 Encounter for other general counseling and advice on contraception: Secondary | ICD-10-CM | POA: Diagnosis not present

## 2024-01-03 DIAGNOSIS — F419 Anxiety disorder, unspecified: Secondary | ICD-10-CM | POA: Diagnosis not present

## 2024-01-03 MED ORDER — NIFEDIPINE ER OSMOTIC RELEASE 30 MG PO TB24
ORAL_TABLET | Freq: Every day | ORAL | 3 refills | Status: DC
  Filled 2024-01-03: qty 30, 30d supply, fill #0

## 2024-01-03 MED ORDER — FLUOXETINE HCL 20 MG PO CAPS
20.0000 mg | ORAL_CAPSULE | Freq: Every day | ORAL | 3 refills | Status: DC
  Filled 2024-01-03: qty 30, 30d supply, fill #0

## 2024-01-11 DIAGNOSIS — Z30431 Encounter for routine checking of intrauterine contraceptive device: Secondary | ICD-10-CM | POA: Diagnosis not present

## 2024-01-11 DIAGNOSIS — Z30430 Encounter for insertion of intrauterine contraceptive device: Secondary | ICD-10-CM | POA: Diagnosis not present

## 2024-01-11 DIAGNOSIS — I10 Essential (primary) hypertension: Secondary | ICD-10-CM | POA: Diagnosis not present

## 2024-01-11 DIAGNOSIS — N939 Abnormal uterine and vaginal bleeding, unspecified: Secondary | ICD-10-CM | POA: Diagnosis not present

## 2024-01-14 ENCOUNTER — Other Ambulatory Visit (HOSPITAL_BASED_OUTPATIENT_CLINIC_OR_DEPARTMENT_OTHER): Payer: Self-pay

## 2024-01-14 MED ORDER — CITALOPRAM HYDROBROMIDE 10 MG PO TABS
10.0000 mg | ORAL_TABLET | Freq: Every day | ORAL | 1 refills | Status: AC
Start: 2024-01-14 — End: ?
  Filled 2024-01-14: qty 30, 30d supply, fill #0

## 2024-02-08 ENCOUNTER — Other Ambulatory Visit (HOSPITAL_BASED_OUTPATIENT_CLINIC_OR_DEPARTMENT_OTHER): Payer: Self-pay

## 2024-02-08 ENCOUNTER — Other Ambulatory Visit (HOSPITAL_COMMUNITY): Payer: Self-pay

## 2024-02-08 ENCOUNTER — Encounter: Payer: Self-pay | Admitting: Physician Assistant

## 2024-02-08 ENCOUNTER — Ambulatory Visit (INDEPENDENT_AMBULATORY_CARE_PROVIDER_SITE_OTHER): Admitting: Physician Assistant

## 2024-02-08 VITALS — BP 128/84 | HR 73 | Ht 66.0 in | Wt 248.5 lb

## 2024-02-08 DIAGNOSIS — Z0000 Encounter for general adult medical examination without abnormal findings: Secondary | ICD-10-CM

## 2024-02-08 DIAGNOSIS — R062 Wheezing: Secondary | ICD-10-CM

## 2024-02-08 DIAGNOSIS — K219 Gastro-esophageal reflux disease without esophagitis: Secondary | ICD-10-CM | POA: Diagnosis not present

## 2024-02-08 DIAGNOSIS — E66813 Obesity, class 3: Secondary | ICD-10-CM

## 2024-02-08 DIAGNOSIS — B9689 Other specified bacterial agents as the cause of diseases classified elsewhere: Secondary | ICD-10-CM

## 2024-02-08 DIAGNOSIS — Z6841 Body Mass Index (BMI) 40.0 and over, adult: Secondary | ICD-10-CM

## 2024-02-08 DIAGNOSIS — J302 Other seasonal allergic rhinitis: Secondary | ICD-10-CM | POA: Diagnosis not present

## 2024-02-08 DIAGNOSIS — J45901 Unspecified asthma with (acute) exacerbation: Secondary | ICD-10-CM

## 2024-02-08 DIAGNOSIS — J4520 Mild intermittent asthma, uncomplicated: Secondary | ICD-10-CM | POA: Diagnosis not present

## 2024-02-08 DIAGNOSIS — E66812 Obesity, class 2: Secondary | ICD-10-CM

## 2024-02-08 DIAGNOSIS — E6609 Other obesity due to excess calories: Secondary | ICD-10-CM | POA: Diagnosis not present

## 2024-02-08 DIAGNOSIS — I10 Essential (primary) hypertension: Secondary | ICD-10-CM | POA: Diagnosis not present

## 2024-02-08 DIAGNOSIS — F410 Panic disorder [episodic paroxysmal anxiety] without agoraphobia: Secondary | ICD-10-CM

## 2024-02-08 DIAGNOSIS — Z13220 Encounter for screening for lipoid disorders: Secondary | ICD-10-CM

## 2024-02-08 DIAGNOSIS — E88819 Insulin resistance, unspecified: Secondary | ICD-10-CM | POA: Diagnosis not present

## 2024-02-08 DIAGNOSIS — Z6839 Body mass index (BMI) 39.0-39.9, adult: Secondary | ICD-10-CM | POA: Diagnosis not present

## 2024-02-08 MED ORDER — MOUNJARO 2.5 MG/0.5ML ~~LOC~~ SOAJ
2.5000 mg | SUBCUTANEOUS | 0 refills | Status: DC
  Filled 2024-02-08 (×2): qty 2, 28d supply, fill #0

## 2024-02-08 MED ORDER — LEVOCETIRIZINE DIHYDROCHLORIDE 5 MG PO TABS
5.0000 mg | ORAL_TABLET | Freq: Every evening | ORAL | 3 refills | Status: AC
  Filled 2024-02-08 (×2): qty 90, 90d supply, fill #0
  Filled 2024-08-09 – 2024-08-21 (×2): qty 90, 90d supply, fill #1

## 2024-02-08 MED ORDER — AIRSUPRA 90-80 MCG/ACT IN AERO
2.0000 | INHALATION_SPRAY | Freq: Four times a day (QID) | RESPIRATORY_TRACT | 1 refills | Status: AC | PRN
  Filled 2024-02-08 (×2): qty 10.7, 30d supply, fill #0

## 2024-02-08 MED ORDER — LEVOCETIRIZINE DIHYDROCHLORIDE 5 MG PO TABS
5.0000 mg | ORAL_TABLET | Freq: Every day | ORAL | 3 refills | Status: DC | PRN
  Filled 2024-02-08: qty 90, 90d supply, fill #0

## 2024-02-08 MED ORDER — CLONAZEPAM 0.5 MG PO TABS
0.5000 mg | ORAL_TABLET | Freq: Two times a day (BID) | ORAL | 1 refills | Status: AC | PRN
  Filled 2024-02-08 (×2): qty 20, 10d supply, fill #0

## 2024-02-08 MED ORDER — LISINOPRIL-HYDROCHLOROTHIAZIDE 10-12.5 MG PO TABS
1.0000 | ORAL_TABLET | Freq: Every day | ORAL | 1 refills | Status: DC
  Filled 2024-02-08 (×2): qty 30, 30d supply, fill #0
  Filled 2024-03-13: qty 30, 30d supply, fill #1

## 2024-02-08 MED ORDER — OMEPRAZOLE 40 MG PO CPDR
40.0000 mg | DELAYED_RELEASE_CAPSULE | Freq: Every day | ORAL | 3 refills | Status: AC
  Filled 2024-02-08 (×2): qty 90, 90d supply, fill #0

## 2024-02-08 NOTE — Progress Notes (Unsigned)
 Complete physical exam  Patient: Kayla Campos   DOB: 11/08/1986   38 y.o. Female  MRN: [id number]  Subjective:    Chief Complaint  Patient presents with   Annual Exam    Kayla Campos is a 38 y.o. female who presents today for a complete physical exam. She reports consuming a {diet types:17450} diet. {types:19826} She generally feels {DESC; WELL/FAIRLY WELL/POORLY:18703}. She reports sleeping {DESC; WELL/FAIRLY WELL/POORLY:18703}. She {does/does not:200015} have additional problems to discuss today.   IUD  Working full time  On prozac no orgasm On lexapro CP with panic attacks echo ordered  Labetolol  IUD   Most recent fall risk assessment:    11/23/2022   10:52 AM  Fall Risk   Falls in the past year? 0  Number falls in past yr: 0  Injury with Fall? 0  Follow up Falls evaluation completed     Most recent depression screenings:    11/23/2022   10:52 AM 08/25/2022   11:21 AM  PHQ 2/9 Scores  PHQ - 2 Score 0 1  PHQ- 9 Score  7    {VISON DENTAL STD PSA (Optional):27386}  {History (Optional):23778}  Patient Care Team: Nolene Ebbs as PCP - General (Family Medicine)   Outpatient Medications Prior to Visit  Medication Sig   FLUoxetine (PROZAC) 10 MG capsule Take 1 capsule (10 mg total) by mouth daily.   labetalol (NORMODYNE) 200 MG tablet Take 1 tablet (200 mg total) by mouth daily.   labetalol (NORMODYNE) 200 MG tablet Take 1 tablet (200 mg total) by mouth 2 (two) times daily.   Prenatal 27-1 MG TABS Take 1 tablet by mouth daily.   citalopram (CELEXA) 10 MG tablet Take 1 tablet (10 mg total) by mouth daily. (Patient not taking: Reported on 02/08/2024)   famotidine (PEPCID) 20 MG tablet Take 1 tablet (20 mg total) by mouth 2 (two) times daily. (Patient not taking: Reported on 02/08/2024)   NIFEdipine (PROCARDIA-XL/NIFEDICAL-XL) 30 MG 24 hr tablet Take by mouth daily. (Patient not taking: Reported on 02/08/2024)   norethindrone (MICRONOR)  0.35 MG tablet Take 1 tablet (0.35 mg total) by mouth daily. (Patient not taking: Reported on 02/08/2024)   nystatin cream (MYCOSTATIN) APPLY TO THE AFFECTED AREA(S) BY TOPICAL ROUTE 2 TIMES PER DAY (Patient not taking: Reported on 02/08/2024)   No facility-administered medications prior to visit.    ROS        Objective:     BP 128/84 (BP Location: Left Arm, Patient Position: Sitting, Cuff Size: Large)   Pulse 73   Ht 5\' 6"  (1.676 m)   Wt 248 lb 8 oz (112.7 kg)   SpO2 97%   BMI 40.11 kg/m  {Vitals History (Optional):23777}  Physical Exam   No results found for any visits on 02/08/24. {Show previous labs (optional):23779}    Assessment & Plan:    Routine Health Maintenance and Physical Exam  Immunization History  Administered Date(s) Administered   Influenza-Unspecified 08/14/2019   PFIZER(Purple Top)SARS-COV-2 Vaccination 11/08/2019, 11/29/2019, 08/23/2020    Health Maintenance  Topic Date Due   Pneumococcal Vaccine 47-25 Years old (1 of 2 - PCV) Never done   COVID-19 Vaccine (4 - 2024-25 season) 07/11/2023   INFLUENZA VACCINE  06/09/2024   Cervical Cancer Screening (HPV/Pap Cotest)  03/27/2026   Hepatitis C Screening  Completed   HIV Screening  Completed   HPV VACCINES  Aged Out   DTaP/Tdap/Td  Discontinued    Discussed health benefits of physical  activity, and encouraged her to engage in regular exercise appropriate for her age and condition.  Problem List Items Addressed This Visit       Unprioritized   Wheezing   Other Visit Diagnoses       Acute bacterial sinusitis         Mild asthma exacerbation          No follow-ups on file.     Tandy Gaw, PA-C

## 2024-02-08 NOTE — Patient Instructions (Addendum)
 Stop labetolol  Start lisinopril/hydrochlorothiazide-2 week nurse visit BP check Klonapin as needed Mounjaro 2.5mg  restart and then increase to 5mg  weekly  Health Maintenance, Female Adopting a healthy lifestyle and getting preventive care are important in promoting health and wellness. Ask your health care provider about: The right schedule for you to have regular tests and exams. Things you can do on your own to prevent diseases and keep yourself healthy. What should I know about diet, weight, and exercise? Eat a healthy diet  Eat a diet that includes plenty of vegetables, fruits, low-fat dairy products, and lean protein. Do not eat a lot of foods that are high in solid fats, added sugars, or sodium. Maintain a healthy weight Body mass index (BMI) is used to identify weight problems. It estimates body fat based on height and weight. Your health care provider can help determine your BMI and help you achieve or maintain a healthy weight. Get regular exercise Get regular exercise. This is one of the most important things you can do for your health. Most adults should: Exercise for at least 150 minutes each week. The exercise should increase your heart rate and make you sweat (moderate-intensity exercise). Do strengthening exercises at least twice a week. This is in addition to the moderate-intensity exercise. Spend less time sitting. Even light physical activity can be beneficial. Watch cholesterol and blood lipids Have your blood tested for lipids and cholesterol at 39 years of age, then have this test every 5 years. Have your cholesterol levels checked more often if: Your lipid or cholesterol levels are high. You are older than 39 years of age. You are at high risk for heart disease. What should I know about cancer screening? Depending on your health history and family history, you may need to have cancer screening at various ages. This may include screening for: Breast  cancer. Cervical cancer. Colorectal cancer. Skin cancer. Lung cancer. What should I know about heart disease, diabetes, and high blood pressure? Blood pressure and heart disease High blood pressure causes heart disease and increases the risk of stroke. This is more likely to develop in people who have high blood pressure readings or are overweight. Have your blood pressure checked: Every 3-5 years if you are 52-13 years of age. Every year if you are 88 years old or older. Diabetes Have regular diabetes screenings. This checks your fasting blood sugar level. Have the screening done: Once every three years after age 46 if you are at a normal weight and have a low risk for diabetes. More often and at a younger age if you are overweight or have a high risk for diabetes. What should I know about preventing infection? Hepatitis B If you have a higher risk for hepatitis B, you should be screened for this virus. Talk with your health care provider to find out if you are at risk for hepatitis B infection. Hepatitis C Testing is recommended for: Everyone born from [birth year] through 1966. Anyone with known risk factors for hepatitis C. Sexually transmitted infections (STIs) Get screened for STIs, including gonorrhea and chlamydia, if: You are sexually active and are younger than 39 years of age. You are older than 39 years of age and your health care provider tells you that you are at risk for this type of infection. Your sexual activity has changed since you were last screened, and you are at increased risk for chlamydia or gonorrhea. Ask your health care provider if you are at risk. Ask your health care  provider about whether you are at high risk for HIV. Your health care provider may recommend a prescription medicine to help prevent HIV infection. If you choose to take medicine to prevent HIV, you should first get tested for HIV. You should then be tested every 3 months for as long as you are taking  the medicine. Pregnancy If you are about to stop having your period (premenopausal) and you may become pregnant, seek counseling before you get pregnant. Take 400 to 800 micrograms (mcg) of folic acid every day if you become pregnant. Ask for birth control (contraception) if you want to prevent pregnancy. Osteoporosis and menopause Osteoporosis is a disease in which the bones lose minerals and strength with aging. This can result in bone fractures. If you are 39 years old or older, or if you are at risk for osteoporosis and fractures, ask your health care provider if you should: Be screened for bone loss. Take a calcium or vitamin D supplement to lower your risk of fractures. Be given hormone replacement therapy (HRT) to treat symptoms of menopause. Follow these instructions at home: Alcohol use Do not drink alcohol if: Your health care provider tells you not to drink. You are pregnant, may be pregnant, or are planning to become pregnant. If you drink alcohol: Limit how much you have to: 0-1 drink a day. Know how much alcohol is in your drink. In the [place] one drink equals one 12 oz bottle of beer (355 mL), one 5 oz glass of wine (148 mL), or one 1 oz glass of hard liquor (44 mL). Lifestyle Do not use any products that contain nicotine or tobacco. These products include cigarettes, chewing tobacco, and vaping devices, such as e-cigarettes. If you need help quitting, ask your health care provider. Do not use street drugs. Do not share needles. Ask your health care provider for help if you need support or information about quitting drugs. General instructions Schedule regular health, dental, and eye exams. Stay current with your vaccines. Tell your health care provider if: You often feel depressed. You have ever been abused or do not feel safe at home. Summary Adopting a healthy lifestyle and getting preventive care are important in promoting health and wellness. Follow your health  care provider's instructions about healthy diet, exercising, and getting tested or screened for diseases. Follow your health care provider's instructions on monitoring your cholesterol and blood pressure. This information is not intended to replace advice given to you by your health care provider. Make sure you discuss any questions you have with your health care provider. Document Revised: 03/17/2021 Document Reviewed: 03/17/2021 Elsevier Patient Education  2024 [employer].

## 2024-02-09 ENCOUNTER — Encounter: Payer: Self-pay | Admitting: Physician Assistant

## 2024-02-09 DIAGNOSIS — J302 Other seasonal allergic rhinitis: Secondary | ICD-10-CM | POA: Insufficient documentation

## 2024-02-09 DIAGNOSIS — F410 Panic disorder [episodic paroxysmal anxiety] without agoraphobia: Secondary | ICD-10-CM | POA: Insufficient documentation

## 2024-02-09 DIAGNOSIS — J4520 Mild intermittent asthma, uncomplicated: Secondary | ICD-10-CM | POA: Insufficient documentation

## 2024-02-09 LAB — LIPID PANEL
Chol/HDL Ratio: 3.3 ratio (ref 0.0–4.4)
Cholesterol, Total: 194 mg/dL (ref 100–199)
HDL: 58 mg/dL (ref >39)
LDL Chol Calc (NIH): 117 mg/dL — ABNORMAL HIGH (ref 0–99)
Triglycerides: 107 mg/dL (ref 0–149)
VLDL Cholesterol Cal: 19 mg/dL (ref 5–40)

## 2024-02-09 LAB — CMP14+EGFR
ALT: 22 IU/L (ref 0–32)
AST: 26 IU/L (ref 0–40)
Albumin: 4.5 g/dL (ref 3.9–4.9)
Alkaline Phosphatase: 87 IU/L (ref 44–121)
BUN/Creatinine Ratio: 24 — ABNORMAL HIGH (ref 9–23)
BUN: 14 mg/dL (ref 6–20)
Bilirubin Total: 0.3 mg/dL (ref 0.0–1.2)
CO2: 21 mmol/L (ref 20–29)
Calcium: 9.7 mg/dL (ref 8.7–10.2)
Chloride: 100 mmol/L (ref 96–106)
Creatinine, Ser: 0.58 mg/dL (ref 0.57–1.00)
Globulin, Total: 2.5 g/dL (ref 1.5–4.5)
Glucose: 85 mg/dL (ref 70–99)
Potassium: 4.2 mmol/L (ref 3.5–5.2)
Sodium: 138 mmol/L (ref 134–144)
Total Protein: 7 g/dL (ref 6.0–8.5)
eGFR: 119 mL/min/{1.73_m2} (ref >59)

## 2024-02-09 LAB — CBC WITH DIFFERENTIAL/PLATELET
Basophils Absolute: 0.1 10*3/uL (ref 0.0–0.2)
Basos: 0 %
EOS (ABSOLUTE): 0.3 10*3/uL (ref 0.0–0.4)
Eos: 3 %
Hematocrit: 40 % (ref 34.0–46.6)
Hemoglobin: 12.9 g/dL (ref 11.1–15.9)
Immature Grans (Abs): 0 10*3/uL (ref 0.0–0.1)
Immature Granulocytes: 0 %
Lymphocytes Absolute: 2.7 10*3/uL (ref 0.7–3.1)
Lymphs: 21 %
MCH: 28.9 pg (ref 26.6–33.0)
MCHC: 32.3 g/dL (ref 31.5–35.7)
MCV: 90 fL (ref 79–97)
Monocytes Absolute: 0.8 10*3/uL (ref 0.1–0.9)
Monocytes: 7 %
Neutrophils Absolute: 8.8 10*3/uL — ABNORMAL HIGH (ref 1.4–7.0)
Neutrophils: 69 %
Platelets: 272 10*3/uL (ref 150–450)
RBC: 4.47 x10E6/uL (ref 3.77–5.28)
RDW: 12.9 % (ref 11.7–15.4)
WBC: 12.6 10*3/uL — ABNORMAL HIGH (ref 3.4–10.8)

## 2024-02-09 LAB — HEMOGLOBIN A1C
Est. average glucose Bld gHb Est-mCnc: 105 mg/dL
Hgb A1c MFr Bld: 5.3 % (ref 4.8–5.6)

## 2024-02-09 LAB — VITAMIN D 25 HYDROXY (VIT D DEFICIENCY, FRACTURES): Vit D, 25-Hydroxy: 27.1 ng/mL — ABNORMAL LOW (ref 30.0–100.0)

## 2024-02-09 LAB — TSH: TSH: 0.998 u[IU]/mL (ref 0.450–4.500)

## 2024-02-09 NOTE — Progress Notes (Signed)
 Vinie,   LDL up a little from optimal. Low fat diet and exercise could help get you to goal.   Vitamin D low. Increase vitamin d by 1000 units a day.   A1C is normal range.   Thyroid normal.   Kidney and liver look good.

## 2024-02-15 ENCOUNTER — Encounter: Payer: Self-pay | Admitting: Cardiovascular Disease

## 2024-02-15 ENCOUNTER — Ambulatory Visit: Payer: Commercial Managed Care - PPO | Attending: Cardiovascular Disease | Admitting: Cardiovascular Disease

## 2024-02-15 VITALS — BP 122/74 | HR 90 | Ht 66.0 in | Wt 254.2 lb

## 2024-02-15 DIAGNOSIS — R0789 Other chest pain: Secondary | ICD-10-CM

## 2024-02-15 DIAGNOSIS — I10 Essential (primary) hypertension: Secondary | ICD-10-CM | POA: Diagnosis not present

## 2024-02-15 NOTE — Patient Instructions (Signed)
   Follow-Up: At [facility], you and your health needs are our priority.  As part of our continuing mission to provide you with exceptional heart care, our providers are all part of one team.  This team includes your primary Cardiologist (physician) and Advanced Practice Providers or APPs (Physician Assistants and Nurse Practitioners) who all work together to provide you with the care you need, when you need it.  Your next appointment:   6 month(s)  Provider:   Nanetta Batty MD           1st Floor: - Lobby - Registration  - Pharmacy  - Lab - Cafe  2nd Floor: - PV Lab - Diagnostic Testing (echo, CT, nuclear med)  3rd Floor: - Vacant  4th Floor: - TCTS (cardiothoracic surgery) - AFib Clinic - Structural Heart Clinic - Vascular Surgery  - Vascular Ultrasound  5th Floor: - HeartCare Cardiology (general and EP) - Clinical Pharmacy for coumadin, hypertension, lipid, weight-loss medications, and med management appointments    Valet parking services will be available as well.

## 2024-02-15 NOTE — Progress Notes (Signed)
 02/15/2024 Kayla Campos   12/02/1985  [id number]  Primary Physician Jomarie Longs, PA-C Primary Cardiologist: Runell Gess MD FACP, [place], [place], [place]  HPI:  Kayla Campos is a 38 y.o. morbidly overweight divorced Caucasian female mother of 1 son who unfortunately was born with double outlet right ventricle, mitral atresia and hypoplastic left ventricle.  He underwent surgery at [facility] and is scheduled for another pediatric cardiac surgery in June.  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  She is accompanied by her mother Aram Beecham today.  Her father Brent Noto was also a patient of mine.  Unfortunately he died 2 years ago.  She was referred by her primary care provider, Tandy Gaw PA-C because of atypical chest pain.  Her risk factors include family history with father that bypass surgery as well as treated hypertension.  She is never had a heart attack or stroke.  She did develop chest pain after the birth of her son which she attributes to anxiety.  The pain was random, substernal and lasted minutes at a time.  Since going back to work her pain is resolved in the last 4 to 6 weeks.   Current Meds  Medication Sig   Albuterol-Budesonide (AIRSUPRA) 90-80 MCG/ACT AERO Inhale 2 puffs into the lungs every 6 (six) hours as needed.   citalopram (CELEXA) 10 MG tablet Take 1 tablet (10 mg total) by mouth daily.   clonazePAM (KLONOPIN) 0.5 MG tablet Take 1 tablet (0.5 mg total) by mouth 2 (two) times daily as needed for anxiety.   levocetirizine (XYZAL) 5 MG tablet Take 1 tablet (5 mg total) by mouth every evening.   lisinopril-hydrochlorothiazide (ZESTORETIC) 10-12.5 MG tablet Take 1 tablet by mouth daily.   nystatin cream (MYCOSTATIN) APPLY TO THE AFFECTED AREA(S) BY TOPICAL ROUTE 2 TIMES PER DAY   omeprazole (PRILOSEC) 40 MG capsule Take 1 capsule (40 mg total) by mouth daily.   Prenatal 27-1 MG TABS Take 1 tablet by mouth daily.    tirzepatide [facility]) 2.5 MG/0.5ML Pen Inject 2.5 mg into the skin once a week.     Allergies  Allergen Reactions   Keflex [Cephalexin] Hives    Social History   Socioeconomic History   Marital status: Divorced    Spouse name: Not on file   Number of children: Not on file   Years of education: Not on file   Highest education level: Not on file  Occupational History   Not on file  Tobacco Use   Smoking status: Never   Smokeless tobacco: Never  Vaping Use   Vaping status: Never Used  Substance and Sexual Activity   Alcohol use: No   Drug use: No   Sexual activity: Yes    Birth control/protection: Pill  Other Topics Concern   Not on file  Social History Narrative   Not on file   Social Drivers of Health   Financial Resource Strain: Not on file  Food Insecurity: No Food Insecurity (10/28/2023)   Hunger Vital Sign    Worried About Running Out of Food in the Last Year: Never true    Ran Out of Food in the Last Year: Never true  Transportation Needs: No Transportation Needs (10/28/2023)   PRAPARE - Administrator, Civil Service (Medical): No    Lack of Transportation (Non-Medical): No  Physical Activity: Not on file  Stress: Not on file  Social Connections: Not on file  Intimate Partner Violence: Not At Risk (10/28/2023)   Humiliation, Afraid, Rape, and Kick questionnaire    Fear of Current or Ex-Partner: No    Emotionally Abused: No    Physically Abused: No    Sexually Abused: No     Review of Systems: General: negative for chills, fever, night sweats or weight changes.  Cardiovascular: negative for chest pain, dyspnea on exertion, edema, orthopnea, palpitations, paroxysmal nocturnal dyspnea or shortness of breath Dermatological: negative for rash Respiratory: negative for cough or wheezing Urologic: negative for hematuria Abdominal: negative for nausea, vomiting, diarrhea, bright red blood per rectum, melena, or hematemesis Neurologic: negative for  visual changes, syncope, or dizziness All other systems reviewed and are otherwise negative except as noted above.    Blood pressure 122/74, pulse 90, height 5\' 6"  (1.676 m), weight 254 lb 3.2 oz (115.3 kg), SpO2 99%.  General appearance: alert and no distress Neck: no adenopathy, no carotid bruit, no JVD, supple, symmetrical, trachea midline, and thyroid not enlarged, symmetric, no tenderness/mass/nodules Lungs: clear to auscultation bilaterally Heart: regular rate and rhythm, S1, S2 normal, no murmur, click, rub or gallop Extremities: extremities normal, atraumatic, no cyanosis or edema Pulses: 2+ and symmetric Skin: Skin color, texture, turgor normal. No rashes or lesions Neurologic: Grossly normal  EKG EKG Interpretation Date/Time:  Tuesday February 15 2024 14:58:05 EDT Ventricular Rate:  90 PR Interval:  140 QRS Duration:  86 QT Interval:  370 QTC Calculation: 452 R Axis:   68  Text Interpretation: Normal sinus rhythm Cannot rule out Inferior infarct , age undetermined When compared with ECG of 17-Jan-2020 04:41, PREVIOUS ECG IS PRESENT Confirmed by Nanetta Batty [phone]) on 02/15/2024 2:59:38 PM    ASSESSMENT AND PLAN:   Essential hypertension, benign History of preeclampsia during her first pregnancy with essential hypertension currently on lisinopril/hydrochlorothiazide.  Blood pressure today is 122/74.  Atypical chest pain History of atypical chest pain after the birth of her child 4 months ago.  She does have a history of anxiety and potential GERD.  Risk factors include treated hypertension and family history.  Since going back to work a month ago her symptoms have resolved.  I have a low suspicion that this is ischemically mediated.  Will see her back in 6 months.  If she has had no chest pain between now and then we will not proceed with an ischemic workup otherwise I will get a coronary calcium score to her stratify.     Runell Gess MD FACP,FACC,FAHA,  [facility] 02/15/2024 3:12 PM

## 2024-02-15 NOTE — Assessment & Plan Note (Signed)
 History of preeclampsia during her first pregnancy with essential hypertension currently on lisinopril/hydrochlorothiazide.  Blood pressure today is 122/74.

## 2024-02-15 NOTE — Assessment & Plan Note (Signed)
 History of atypical chest pain after the birth of her child 4 months ago.  She does have a history of anxiety and potential GERD.  Risk factors include treated hypertension and family history.  Since going back to work a month ago her symptoms have resolved.  I have a low suspicion that this is ischemically mediated.  Will see her back in 6 months.  If she has had no chest pain between now and then we will not proceed with an ischemic workup otherwise I will get a coronary calcium score to her stratify.

## 2024-02-17 DIAGNOSIS — F418 Other specified anxiety disorders: Secondary | ICD-10-CM | POA: Diagnosis not present

## 2024-02-22 ENCOUNTER — Ambulatory Visit

## 2024-03-02 ENCOUNTER — Ambulatory Visit (INDEPENDENT_AMBULATORY_CARE_PROVIDER_SITE_OTHER)

## 2024-03-02 VITALS — BP 116/69 | HR 75 | Ht 66.0 in

## 2024-03-02 DIAGNOSIS — I10 Essential (primary) hypertension: Secondary | ICD-10-CM | POA: Diagnosis not present

## 2024-03-02 NOTE — Patient Instructions (Signed)
 Return on 05/30/2024 for follow up appointment with Sandy Crumb, PA.

## 2024-03-02 NOTE — Progress Notes (Signed)
   Established Patient Office Visit  Subjective   Patient ID: Kayla Campos, female    DOB: [birth year]  Age: 38 y.o. MRN: [id number]  Chief Complaint  Patient presents with   Hypertension    BP check nurse visit     HPI  Hypertension- BP check nurse visit. Patient denies chest pain , shortness of breath, dizziness, palpitations , headaches or medication problems. Jsut FYI for provider-patient states she has not yet started Mounjaro  but plans to do so on Monday 03/06/24. Patient states she is doing well with restart of Lisinopril / hydrochlorothiazide  states home and BP readings done a work have been good - without  any side effects to medication.   ROS    Objective:     BP 116/69   Pulse 75   Ht 5\' 6"  (1.676 m)   SpO2 98%   BMI 41.03 kg/m    Physical Exam   No results found for any visits on 03/02/24.    The ASCVD Risk score (Arnett DK, et al., 2019) failed to calculate for the following reasons:   The 2019 ASCVD risk score is only valid for ages 8 to 48    Assessment & Plan:  BP check nurse visit - reading = 116/69. Return at upcoming appt with Sandy Crumb, PA scheduled for 05/30/24.  Problem List Items Addressed This Visit       Cardiovascular and Mediastinum   Essential hypertension, benign - Primary    Return in about 3 months (around 05/30/2024) for follow up appointment with Sandy Crumb, PA.    Dickie Found, LPN

## 2024-04-06 ENCOUNTER — Other Ambulatory Visit: Payer: Self-pay | Admitting: Physician Assistant

## 2024-04-06 DIAGNOSIS — I10 Essential (primary) hypertension: Secondary | ICD-10-CM

## 2024-04-06 DIAGNOSIS — Z6841 Body Mass Index (BMI) 40.0 and over, adult: Secondary | ICD-10-CM

## 2024-04-06 DIAGNOSIS — E88819 Insulin resistance, unspecified: Secondary | ICD-10-CM

## 2024-04-10 ENCOUNTER — Encounter (HOSPITAL_COMMUNITY): Payer: Self-pay

## 2024-04-10 ENCOUNTER — Other Ambulatory Visit (HOSPITAL_COMMUNITY): Payer: Self-pay

## 2024-04-10 MED ORDER — TIRZEPATIDE 5 MG/0.5ML ~~LOC~~ SOAJ
5.0000 mg | SUBCUTANEOUS | 0 refills | Status: DC
  Filled 2024-04-10 – 2024-04-11 (×2): qty 2, 28d supply, fill #0

## 2024-04-10 MED ORDER — LISINOPRIL-HYDROCHLOROTHIAZIDE 10-12.5 MG PO TABS
1.0000 | ORAL_TABLET | Freq: Every day | ORAL | 1 refills | Status: DC
  Filled 2024-04-10: qty 30, 30d supply, fill #0
  Filled 2024-05-07: qty 30, 30d supply, fill #1

## 2024-04-11 ENCOUNTER — Other Ambulatory Visit (HOSPITAL_COMMUNITY): Payer: Self-pay

## 2024-04-12 ENCOUNTER — Telehealth: Payer: Self-pay

## 2024-04-12 ENCOUNTER — Other Ambulatory Visit (HOSPITAL_COMMUNITY): Payer: Self-pay

## 2024-04-12 ENCOUNTER — Other Ambulatory Visit: Payer: Self-pay | Admitting: Physician Assistant

## 2024-04-12 DIAGNOSIS — I10 Essential (primary) hypertension: Secondary | ICD-10-CM

## 2024-04-12 DIAGNOSIS — E88819 Insulin resistance, unspecified: Secondary | ICD-10-CM

## 2024-04-12 DIAGNOSIS — Z6841 Body Mass Index (BMI) 40.0 and over, adult: Secondary | ICD-10-CM

## 2024-04-13 ENCOUNTER — Other Ambulatory Visit (HOSPITAL_COMMUNITY): Payer: Self-pay

## 2024-04-13 ENCOUNTER — Telehealth: Payer: Self-pay

## 2024-04-13 NOTE — Telephone Encounter (Signed)
 Pharmacy Patient Advocate Encounter   Received notification from Pt Calls Messages that prior authorization for Mounjaro  5mg /0.87ml is required/requested.   Insurance verification completed.   The patient is insured through [facility] .   Per test claim: PA required; PA submitted to above mentioned insurance via CoverMyMeds Key/confirmation #/EOC [id number] Status is pending

## 2024-04-14 ENCOUNTER — Other Ambulatory Visit (HOSPITAL_COMMUNITY): Payer: Self-pay

## 2024-04-14 ENCOUNTER — Encounter: Payer: Self-pay | Admitting: Physician Assistant

## 2024-04-14 ENCOUNTER — Other Ambulatory Visit (HOSPITAL_BASED_OUTPATIENT_CLINIC_OR_DEPARTMENT_OTHER): Payer: Self-pay

## 2024-04-14 ENCOUNTER — Encounter (HOSPITAL_COMMUNITY): Payer: Self-pay

## 2024-04-14 MED ORDER — TIRZEPATIDE 5 MG/0.5ML ~~LOC~~ SOAJ
5.0000 mg | SUBCUTANEOUS | 1 refills | Status: DC
  Filled 2024-04-14 – 2024-04-19 (×3): qty 2, 28d supply, fill #0

## 2024-04-14 NOTE — Telephone Encounter (Signed)
 Patient requesting rx rf of Mounjaro  Last written as mounjaro  5mg  04/10/2024 Last OV 04/01/225 Upcoming appt 05/30/2024

## 2024-04-17 ENCOUNTER — Other Ambulatory Visit (HOSPITAL_COMMUNITY): Payer: Self-pay

## 2024-04-17 NOTE — Telephone Encounter (Signed)
 Pharmacy Patient Advocate Encounter  Received notification from [facility] that Prior Authorization for Mounjaro  5MG /0.5ML auto-injectors has been DENIED.  Full denial letter will be uploaded to the media tab. See denial reason below.   PA #/Case ID/Reference #: [id number]

## 2024-04-19 ENCOUNTER — Other Ambulatory Visit (HOSPITAL_COMMUNITY): Payer: Self-pay

## 2024-04-19 ENCOUNTER — Encounter (HOSPITAL_COMMUNITY): Payer: Self-pay

## 2024-04-19 NOTE — Telephone Encounter (Signed)
 Denial showing in patient chart. How would you like to proceed? Brayton Calin, CPhT    04/17/24  3:09 PM Note Pharmacy Patient Advocate Encounter   Received notification from [facility] that Prior Authorization for Mounjaro  5MG /0.5ML auto-injectors has been DENIED.  Full denial letter will be uploaded to the media tab.

## 2024-04-21 NOTE — Telephone Encounter (Signed)
 Kayla Campos

## 2024-04-26 MED ORDER — NALTREXONE-BUPROPION HCL ER 8-90 MG PO TB12: 2.0000 | ORAL_TABLET | Freq: Two times a day (BID) | ORAL | 2 refills | Status: DC

## 2024-04-26 MED ORDER — NALTREXONE-BUPROPION HCL ER 8-90 MG PO TB12
ORAL_TABLET | ORAL | 0 refills | Status: DC
Start: 2024-04-26 — End: 2024-09-26

## 2024-04-26 NOTE — Addendum Note (Signed)
 Addended byAraceli Knight on: 04/26/2024 11:50 AM   Modules accepted: Orders

## 2024-05-16 ENCOUNTER — Ambulatory Visit: Admitting: Physician Assistant

## 2024-05-23 ENCOUNTER — Other Ambulatory Visit: Payer: Self-pay | Admitting: Medical Genetics

## 2024-05-29 MED ORDER — TIRZEPATIDE 10 MG/0.5ML ~~LOC~~ SOAJ: SUBCUTANEOUS | 11 refills | Status: DC

## 2024-05-29 NOTE — Addendum Note (Signed)
 Addended by: ANTONIETTE VERMELL CROME on: 05/29/2024 02:47 PM   Modules accepted: Orders

## 2024-05-30 ENCOUNTER — Ambulatory Visit: Admitting: Physician Assistant

## 2024-05-31 ENCOUNTER — Telehealth: Payer: Self-pay

## 2024-05-31 NOTE — Telephone Encounter (Signed)
 Copied from CRM [id number]. Topic: General - Other >> May 30, 2024 12:25 PM Kayla Campos wrote: Reason for CRM: [facility] called they Need the order fro Lipo slim refaxed it was missing patient's information. Patient wants a 3 month supply to get 20 percent discount.

## 2024-06-02 ENCOUNTER — Other Ambulatory Visit: Payer: Self-pay

## 2024-06-02 ENCOUNTER — Telehealth: Payer: Self-pay

## 2024-06-02 MED ORDER — TIRZEPATIDE 10 MG/0.5ML ~~LOC~~ SOAJ: SUBCUTANEOUS | 3 refills | Status: DC

## 2024-06-02 NOTE — Telephone Encounter (Signed)
 Copied from CRM [id number]. Topic: General - Other >> May 30, 2024 12:25 PM Adrianna P wrote: Reason for CRM: [facility] called they Need the order fro Lipo slim refaxed it was missing patient's information. Patient wants a 3 month supply to get 20 percent discount. >> Jun 02, 2024  9:33 AM Graeme ORN wrote: Bobetta with [facility] called to check status of request. They need Rx to include patient and provider information (was missing) also needs to be for 3 month supply for patient to get discount. Please reach back out to pharmacy. [phone]  Thank You

## 2024-06-02 NOTE — Telephone Encounter (Signed)
 Signed and sent to [facility]

## 2024-06-02 NOTE — Telephone Encounter (Signed)
 Faxed  to [facility] -

## 2024-06-02 NOTE — Telephone Encounter (Signed)
 Prescription printed - awaiting Dr. Alvan signature to send.

## 2024-06-02 NOTE — Telephone Encounter (Signed)
 Do we have a copy of the original order and we can just fill that part out and refax it.  Or we can at least copy the original order onto a new one and fill out the nutritional information and I can change to 90-day and we can review fax.

## 2024-06-02 NOTE — Telephone Encounter (Signed)
 Note in patient chart - Kayla Campos asking that Dr. Alvan sign script as 90 day supply and fax to [facility]. Printed script and will have Dr. Alvan to sing and will fax

## 2024-06-02 NOTE — Telephone Encounter (Signed)
 Reprinted the prescription form patient chart - changed to 90 day supply - had Dr. Alvan to sign the prescription and re-faxed to  [facility].

## 2024-06-13 ENCOUNTER — Other Ambulatory Visit: Payer: Self-pay

## 2024-06-13 ENCOUNTER — Other Ambulatory Visit (HOSPITAL_COMMUNITY): Payer: Self-pay

## 2024-06-13 ENCOUNTER — Other Ambulatory Visit: Payer: Self-pay | Admitting: Physician Assistant

## 2024-06-13 DIAGNOSIS — I10 Essential (primary) hypertension: Secondary | ICD-10-CM

## 2024-06-13 MED ORDER — LISINOPRIL-HYDROCHLOROTHIAZIDE 10-12.5 MG PO TABS
1.0000 | ORAL_TABLET | Freq: Every day | ORAL | 1 refills | Status: DC
  Filled 2024-06-13: qty 30, 30d supply, fill #0
  Filled 2024-07-17: qty 30, 30d supply, fill #1

## 2024-08-09 ENCOUNTER — Other Ambulatory Visit: Payer: Self-pay

## 2024-08-09 ENCOUNTER — Encounter: Payer: Self-pay | Admitting: Pharmacist

## 2024-08-14 ENCOUNTER — Other Ambulatory Visit: Payer: Self-pay

## 2024-08-21 ENCOUNTER — Other Ambulatory Visit: Payer: Self-pay | Admitting: Physician Assistant

## 2024-08-21 DIAGNOSIS — I10 Essential (primary) hypertension: Secondary | ICD-10-CM

## 2024-08-22 ENCOUNTER — Other Ambulatory Visit: Payer: Self-pay

## 2024-08-22 ENCOUNTER — Other Ambulatory Visit: Payer: Self-pay | Admitting: Medical Genetics

## 2024-08-22 ENCOUNTER — Other Ambulatory Visit (HOSPITAL_COMMUNITY): Payer: Self-pay

## 2024-08-22 DIAGNOSIS — Z006 Encounter for examination for normal comparison and control in clinical research program: Secondary | ICD-10-CM

## 2024-08-22 MED ORDER — LISINOPRIL-HYDROCHLOROTHIAZIDE 10-12.5 MG PO TABS
1.0000 | ORAL_TABLET | Freq: Every day | ORAL | 0 refills | Status: DC
  Filled 2024-08-22: qty 30, 30d supply, fill #0

## 2024-09-26 ENCOUNTER — Other Ambulatory Visit (HOSPITAL_COMMUNITY): Payer: Self-pay

## 2024-09-26 ENCOUNTER — Ambulatory Visit: Admitting: Physician Assistant

## 2024-09-26 DIAGNOSIS — M722 Plantar fascial fibromatosis: Secondary | ICD-10-CM | POA: Diagnosis not present

## 2024-09-26 DIAGNOSIS — L659 Nonscarring hair loss, unspecified: Secondary | ICD-10-CM

## 2024-09-26 DIAGNOSIS — I10 Essential (primary) hypertension: Secondary | ICD-10-CM

## 2024-09-26 DIAGNOSIS — Z6841 Body Mass Index (BMI) 40.0 and over, adult: Secondary | ICD-10-CM

## 2024-09-26 MED ORDER — PRENATAL 27-1 MG PO TABS
1.0000 | ORAL_TABLET | Freq: Every day | ORAL | 4 refills | Status: AC
  Filled 2024-09-26: qty 90, 90d supply, fill #0

## 2024-09-26 MED ORDER — LISINOPRIL-HYDROCHLOROTHIAZIDE 10-12.5 MG PO TABS
1.0000 | ORAL_TABLET | Freq: Every day | ORAL | 3 refills | Status: AC
  Filled 2024-09-26: qty 90, 90d supply, fill #0

## 2024-09-26 MED ORDER — TIRZEPATIDE 10 MG/0.5ML ~~LOC~~ SOAJ: SUBCUTANEOUS | 4 refills | Status: AC

## 2024-09-27 ENCOUNTER — Encounter: Payer: Self-pay | Admitting: Physician Assistant

## 2024-09-27 NOTE — Progress Notes (Signed)
 Established Patient Office Visit  Subjective   Patient ID: Kayla Campos, female    DOB: [birth year]  Age: 38 y.o. MRN: [id number]  Chief Complaint  Patient presents with   Medical Management of Chronic Issues    HPI .SABRADiscussed the use of AI scribe software for clinical note transcription with the patient, who gave verbal consent to proceed.  History of Present Illness Kayla Campos is a 38 year old female who presents for a follow-up regarding her weight management and medication side effects.  Weight management - Current weight is 249 pounds, decreased from 270 pounds. - Weight loss has occurred at a slower pace compared to previous medication regimens. She is on compounded [facility] at [facility].  - Previous use of Wegovy  resulted in a 70-pound weight loss. - Previous use of Mounjaro  led to rapid weight loss before discontinuation due to pregnancy. - Current medication regimen is 100 units, perceived as less effective than prior medications. - Concern regarding the cost of current medication. 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 - Often does not eat until dinner due to lack of hunger, which may be impacting weight loss.  Medication side effects - Experiences gas and nausea, particularly at night. - Similar gastrointestinal side effects occurred with previous medications. - Not currently taking Contrave  due to complexity of medication schedule and caregiving responsibilities.  Caregiver responsibilities - Primary caregiver for child with feeding difficulties related to congenital heart condition and G-tube. - Challenges managing child's feeding and her own health. - Child dislikes taste of current formula and prefers real food over baby food.  Plantar fasciitis - would like referral for orthotics     ROS See HPI.    Objective:     BP 109/78   Pulse 82   Ht 5' 6 (1.676  m)   Wt 249 lb (112.9 kg)   SpO2 99%   BMI 40.19 kg/m  BP Readings from Last 3 Encounters:  09/26/24 109/78  03/02/24 116/69  02/15/24 122/74   Wt Readings from Last 3 Encounters:  09/26/24 249 lb (112.9 kg)  02/15/24 254 lb 3.2 oz (115.3 kg)  02/08/24 248 lb 8 oz (112.7 kg)      Physical Exam Constitutional:      Appearance: Normal appearance. She is obese.  HENT:     Head: Normocephalic.  Cardiovascular:     Rate and Rhythm: Normal rate and regular rhythm.  Pulmonary:     Effort: Pulmonary effort is normal.     Breath sounds: Normal breath sounds.  Musculoskeletal:     Right lower leg: No edema.     Left lower leg: No edema.  Neurological:     General: No focal deficit present.     Mental Status: She is alert and oriented to person, place, and time.  Psychiatric:        Mood and Affect: Mood normal.       Assessment & Plan:  .SABRACatricia was seen today for medical management of chronic issues.  Diagnoses and all orders for this visit:  Morbid obesity (HCC) -     tirzepatide  (MOUNJARO ) 10 MG/0.5ML Pen; Liposlim.  Tirzepatide /Pyridoxine/Thiamine/L-Carnitine 10mg /mL. 15 mg/150 units subcu weekly  Essential hypertension, benign -     lisinopril -hydrochlorothiazide  (ZESTORETIC ) 10-12.5 MG tablet; Take 1 tablet by mouth daily.  Bilateral plantar fasciitis -     Ambulatory referral to Podiatry  Hair loss -  Prenatal 27-1 MG TABS; Take 1 tablet by mouth daily.   Assessment & Plan Obesity Managing with tirzepatide  (Zepbound ) 100 units. Mild side effects noted. Weight reduced from 270 lbs to 249 lbs. Discussed cost concerns and alternatives. - Continue compounded tirzepatide  (liposlim) at 100 units. - Encouraged natural activity, such as walking. - Increase calories during the day, especially protein.  - Hair loss likely SE of GLP-1, start pre-natal vitamin and see if that is helpful. Eating more could help as well with hair loss.  - Discussed potential cost  issues and self-pay options.  BP to goal today - refilled zestoretic   Bilateral plantar fasciitis - referral to podiatry at patients request - discussed good supportive shoes, epson foot soaks, icing at bedtime, foam roller and PT exercises.   General Health Maintenance Discussed importance of healthy lifestyle and challenges due to childcare. Emphasized regular eating to maintain metabolism. - Encouraged regular eating habits. - Advised on maintaining a healthy lifestyle despite childcare challenges.      Return in about 6 months (around 03/26/2025).    Mcdaniel Ohms, PA-C

## 2024-10-02 ENCOUNTER — Other Ambulatory Visit (HOSPITAL_COMMUNITY): Payer: Self-pay

## 2024-10-02 ENCOUNTER — Ambulatory Visit

## 2024-10-02 ENCOUNTER — Ambulatory Visit: Admitting: Podiatry

## 2024-10-02 DIAGNOSIS — M216X1 Other acquired deformities of right foot: Secondary | ICD-10-CM | POA: Diagnosis not present

## 2024-10-02 DIAGNOSIS — M216X2 Other acquired deformities of left foot: Secondary | ICD-10-CM | POA: Diagnosis not present

## 2024-10-02 DIAGNOSIS — M722 Plantar fascial fibromatosis: Secondary | ICD-10-CM | POA: Diagnosis not present

## 2024-10-02 MED ORDER — MELOXICAM 15 MG PO TABS
15.0000 mg | ORAL_TABLET | Freq: Every day | ORAL | 1 refills | Status: AC
  Filled 2024-10-02: qty 60, 60d supply, fill #0

## 2024-10-02 MED ORDER — METHYLPREDNISOLONE 4 MG PO TBPK
ORAL_TABLET | ORAL | 0 refills | Status: AC
  Filled 2024-10-02: qty 21, 6d supply, fill #0

## 2024-10-02 NOTE — Progress Notes (Signed)
   Chief Complaint  Patient presents with   Foot Pain    Bilateral Heel pain x 2 years worse in Right.  Not diabetic. No anti coag    Subjective: 38 y.o. female presenting today as a new patient for evaluation of bilateral heel pain ongoing for about 2 years now.   Past Medical History:  Diagnosis Date   Alopecia    per patient    Asthma    Eczema    Hypertension    Insulin  resistance    per patient    Pleurisy    PNA (pneumonia)    Urticaria    Vitiligo    per patient, dx by derm     Objective: Physical Exam General: The patient is alert and oriented x3 in no acute distress.  Dermatology: Skin is warm, dry and supple bilateral lower extremities. Negative for open lesions or macerations bilateral.   Vascular: Dorsalis Pedis and Posterior Tibial pulses palpable bilateral.  Capillary fill time is immediate to all digits.  Neurological: Grossly intact via light touch  Musculoskeletal: Tenderness to palpation to the plantar aspect of the bilateral heels along the plantar fascia. All other joints range of motion within normal limits bilateral. Strength 5/5 in all groups bilateral.   Radiographic exam B/L feet 10/02/2024: Normal osseous mineralization. Joint spaces preserved. No fracture/dislocation/boney destruction. No other soft tissue abnormalities or radiopaque foreign bodies.   Assessment: 1. plantar fasciitis bilateral feet  Plan of Care:  -Patient evaluated. Xrays reviewed.   -Injection of 0.5cc Celestone  soluspan injected into the bilateral heels.  -Prescription for Medrol  Dosepak -Prescription for meloxicam  15 mg daily after completion of the Dosepak -Refrain from going barefoot.  Recommend good supportive tennis shoes and sneakers -Instructed patient regarding therapies and modalities at home to alleviate symptoms.  -I do believe that ultimately the patient would benefit from custom molded orthotics to support the medial longitudinal arch of the foot and  potentially alleviate a lot of the patient's plantar fasciitis symptoms.  Today the patient was molded for custom molded insoles -Return to clinic for orthotics pickup and dispensable  Thresa EMERSON Sar, DPM [facility]  Dr. Thresa EMERSON Sar, DPM    [address] [place], [place] [address]                Office [phone]  Fax [phone]

## 2024-10-03 ENCOUNTER — Other Ambulatory Visit (HOSPITAL_COMMUNITY): Payer: Self-pay

## 2024-10-15 ENCOUNTER — Ambulatory Visit
Admission: EM | Admit: 2024-10-15 | Discharge: 2024-10-15 | Disposition: A | Discharge status: Home / Self Care | Attending: Family Medicine | Admitting: Family Medicine

## 2024-10-15 DIAGNOSIS — R300 Dysuria: Secondary | ICD-10-CM | POA: Diagnosis not present

## 2024-10-15 DIAGNOSIS — M216X2 Other acquired deformities of left foot: Secondary | ICD-10-CM | POA: Diagnosis not present

## 2024-10-15 DIAGNOSIS — M722 Plantar fascial fibromatosis: Secondary | ICD-10-CM | POA: Diagnosis not present

## 2024-10-15 DIAGNOSIS — M216X1 Other acquired deformities of right foot: Secondary | ICD-10-CM | POA: Diagnosis not present

## 2024-10-15 LAB — POCT URINE DIPSTICK
Bilirubin, UA: NEGATIVE
Glucose, UA: NEGATIVE mg/dL
Ketones, POC UA: NEGATIVE mg/dL
Leukocytes, UA: NEGATIVE
Nitrite, UA: NEGATIVE
POC PROTEIN,UA: 30 — AB
Spec Grav, UA: >=1.03 — AB (ref 1.010–1.025)
Urobilinogen, UA: 0.2 U/dL
pH, UA: 5.5 (ref 5.0–8.0)

## 2024-10-15 MED ORDER — BETAMETHASONE SOD PHOS & ACET 6 (3-3) MG/ML IJ SUSP
3.0000 mg | Freq: Once | INTRAMUSCULAR | Status: AC
  Administered 2024-10-15: 3 mg via INTRA_ARTICULAR

## 2024-10-15 MED ORDER — PHENAZOPYRIDINE HCL 200 MG PO TABS: 200.0000 mg | ORAL_TABLET | Freq: Three times a day (TID) | ORAL | 0 refills | Status: AC | PRN

## 2024-10-15 NOTE — ED Provider Notes (Signed)
 [facility]    CSN: [id number] Arrival date & time: 10/15/24  0941      History   Chief Complaint No chief complaint on file.   HPI Kayla Campos is a 38 y.o. female.   Patient presenting today with 1 day history of urinary frequency, suprapubic pressure, urethral discomfort.  Denies fever, chills, hematuria, nausea, vomiting, bowel changes, vaginal symptoms.  So far not trying anything over-the-counter for symptoms.  Notes she has a habit of holding her urine too long and not drinking enough fluids.  LMP 08/23/2024, IUD in place.    Past Medical History:  Diagnosis Date   Alopecia    per patient    Asthma    Eczema    Hypertension    Insulin  resistance    per patient    Pleurisy    PNA (pneumonia)    Urticaria    Vitiligo    per patient, dx by derm    Patient Active Problem List   Diagnosis Date Noted   Bilateral plantar fasciitis 09/26/2024   Atypical chest pain 02/15/2024   Class 3 severe obesity due to excess calories without serious comorbidity with body mass index (BMI) of 40.0 to 44.9 in adult (HCC) 02/09/2024   Mild intermittent asthma without complication 02/09/2024   Seasonal allergies 02/09/2024   Panic attacks 02/09/2024   Preeclampsia 10/27/2023   Excess skin of abdomen 11/23/2022   Intertrigo 10/05/2022   Insulin  resistance 08/11/2022   Pulsatile tinnitus of right ear 08/11/2022   Anxiety and depression 08/11/2022   Seborrheic keratoses, inflamed 12/23/2021   Elevated CK 08/17/2021   Influenza A 02/26/2021   Anxiety 10/18/2020   Elevated LDL cholesterol level 07/30/2020   Vitiligo 07/30/2020   Allergic conjunctivitis 01/03/2020   Mild persistent asthma 01/03/2020   Food allergy 01/03/2020   Binge-eating disorder, mild 12/12/2019   Perennial and seasonal allergic rhinitis 12/12/2019   Wheezing 12/12/2019   Hematuria 02/26/2016   Stress at home 10/29/2015   Abnormal weight gain 10/29/2015   Hair loss 10/29/2015    Class 1 obesity due to excess calories with serious comorbidity and body mass index (BMI) of 30.0 to 30.9 in adult 10/29/2015   BMI 36.0-36.9,adult 12/11/2014   Hyperpigmentation of skin of cheek 12/11/2014   Fatty liver disease, nonalcoholic 06/03/2014   Bloating 05/25/2014   Gastroesophageal reflux disease without esophagitis 05/25/2014   Nausea alone 05/25/2014   Essential hypertension, benign 05/25/2014   Keratosis pilaris 10/11/2013    Past Surgical History:  Procedure Laterality Date   WISDOM TOOTH EXTRACTION  2008    OB History     Gravida  2   Para      Term      Preterm      AB  1   Living         SAB  1   IAB      Ectopic      Multiple      Live Births               Home Medications    Prior to Admission medications   Medication Sig Start Date End Date Taking? Authorizing Provider  phenazopyridine  (PYRIDIUM ) 200 MG tablet Take 1 tablet (200 mg total) by mouth 3 (three) times daily as needed for pain. 10/15/24  Yes Stuart Vernell Norris, PA-C  Albuterol -Budesonide  (AIRSUPRA ) 90-80 MCG/ACT AERO Inhale 2 puffs into the lungs every 6 (six) hours as needed. 02/08/24   Breeback, Jade L,  PA-C  citalopram  (CELEXA ) 10 MG tablet Take 1 tablet (10 mg total) by mouth daily. 01/14/24     clonazePAM  (KLONOPIN ) 0.5 MG tablet Take 1 tablet (0.5 mg total) by mouth 2 (two) times daily as needed for anxiety. 02/08/24   Breeback, Jade L, PA-C  levocetirizine (XYZAL ) 5 MG tablet Take 1 tablet (5 mg total) by mouth every evening. 02/08/24   Breeback, Jade L, PA-C  lisinopril -hydrochlorothiazide  (ZESTORETIC ) 10-12.5 MG tablet Take 1 tablet by mouth daily. 09/26/24   Breeback, Jade L, PA-C  meloxicam  (MOBIC ) 15 MG tablet Take 1 tablet (15 mg total) by mouth daily. 10/02/24 01/30/25  Janit Thresa HERO, DPM  methylPREDNISolone  (MEDROL  DOSEPAK) 4 MG TBPK tablet Take as directed 10/02/24   Evans, Brent M, DPM  nystatin  cream (MYCOSTATIN ) APPLY TO THE AFFECTED AREA(S) BY TOPICAL ROUTE 2  TIMES PER DAY 11/08/23     omeprazole  (PRILOSEC) 40 MG capsule Take 1 capsule (40 mg total) by mouth daily. 02/08/24   Breeback, Jade L, PA-C  Prenatal 27-1 MG TABS Take 1 tablet by mouth daily. 09/26/24   Breeback, Jade L, PA-C  tirzepatide  (MOUNJARO ) 10 MG/0.5ML Pen Liposlim.  Tirzepatide /Pyridoxine/Thiamine/L-Carnitine 10mg /mL. 15 mg/150 units subcu weekly 09/26/24   Breeback, Jade L, PA-C  enoxaparin  (LOVENOX ) 60 MG/0.6ML injection Inject 0.6 mLs (60 mg total) subcutaneously once daily for 14 days in abdomen, rotating site with each injection. 11/02/23 11/02/23      Family History Family History  Problem Relation Age of Onset   Hypertension Mother    Hypothyroidism Mother    Diabetes Father    Hypertension Father    Coronary artery disease Father    Hyperlipidemia Father    Congestive Heart Failure Father    Esophageal cancer Father    Hypertension Sister    Allergic rhinitis Sister    Eczema Sister    Urticaria Sister    IgA nephropathy Sister    Diabetes Sister    Healthy Sister    Healthy Brother    Hyperlipidemia Maternal Aunt    Heart attack Maternal Grandmother    Heart attack Maternal Grandfather    Diabetes Maternal Grandfather    Stroke Maternal Grandfather    Cancer Paternal Grandmother    Heart attack Paternal Grandmother    Diabetes Paternal Grandmother    Asthma Neg Hx    Angioedema Neg Hx     Social History Social History   Tobacco Use   Smoking status: Never   Smokeless tobacco: Never  Vaping Use   Vaping status: Never Used  Substance Use Topics   Alcohol use: No   Drug use: No     Allergies   Keflex [cephalexin]   Review of Systems Review of Systems PER HPI  Physical Exam Triage Vital Signs ED Triage Vitals  Encounter Vitals Group     BP 10/15/24 0948 106/71     Girls Systolic BP Percentile --      Girls Diastolic BP Percentile --      Boys Systolic BP Percentile --      Boys Diastolic BP Percentile --      Pulse Rate 10/15/24  0948 77     Resp 10/15/24 0948 16     Temp 10/15/24 0948 97.6 F (36.4 C)     Temp Source 10/15/24 0948 Oral     SpO2 10/15/24 0948 94 %     Weight --      Height --      Head Circumference --  Peak Flow --      Pain Score 10/15/24 0950 5     Pain Loc --      Pain Education --      Exclude from Growth Chart --    No data found.  Updated Vital Signs BP 106/71 (BP Location: Right Arm)   Pulse 77   Temp 97.6 F (36.4 C) (Oral)   Resp 16   LMP 08/23/2024   SpO2 94%   Visual Acuity Right Eye Distance:   Left Eye Distance:   Bilateral Distance:    Right Eye Near:   Left Eye Near:    Bilateral Near:     Physical Exam Vitals and nursing note reviewed.  Constitutional:      Appearance: Normal appearance. She is not ill-appearing.  HENT:     Head: Atraumatic.  Eyes:     Extraocular Movements: Extraocular movements intact.     Conjunctiva/sclera: Conjunctivae normal.  Cardiovascular:     Rate and Rhythm: Normal rate.  Pulmonary:     Effort: Pulmonary effort is normal.  Abdominal:     General: Bowel sounds are normal. There is no distension.     Palpations: Abdomen is soft.     Tenderness: There is no abdominal tenderness. There is no guarding.  Musculoskeletal:        General: Normal range of motion.     Cervical back: Normal range of motion and neck supple.  Skin:    General: Skin is warm and dry.  Neurological:     Mental Status: She is alert and oriented to person, place, and time.  Psychiatric:        Mood and Affect: Mood normal.        Thought Content: Thought content normal.        Judgment: Judgment normal.     Vital signs and exam reassuring today, urinalysis without evidence of urinary tract infection.  Urine culture pending as patient states it feels exactly like previous urinary tract infections for further rule out.  Discussed to increase fluids, Prodium as needed, supportive over-the-counter medications at home care.  Return for worsening or  unresolving symptoms.  UC Treatments / Results  Labs (all labs ordered are listed, but only abnormal results are displayed) Labs Reviewed  POCT URINE DIPSTICK - Abnormal; Notable for the following components:      Result Value   Color, UA straw (*)    Spec Grav, UA >=1.030 (*)    Blood, UA large (*)    POC PROTEIN,UA =30 (*)    All other components within normal limits  URINE CULTURE    EKG   Radiology No results found.  Procedures Procedures (including critical care time)  Medications Ordered in UC Medications - No data to display  Initial Impression / Assessment and Plan / UC Course  I have reviewed the triage vital signs and the nursing notes.  Pertinent labs & imaging results that were available during my care of the patient were reviewed by me and considered in my medical decision making (see chart for details).      Final Clinical Impressions(s) / UC Diagnoses   Final diagnoses:  Dysuria     Discharge Instructions      Your urine testing today did not show any evidence of a urinary tract infection.  I have sent out for culture just to be sure and we will let you know if this comes back abnormal.  Drink plenty of water, take the Pyridium   as needed for burning and discomfort and follow-up for significantly worsening symptoms.    ED Prescriptions     Medication Sig Dispense Auth. Provider   phenazopyridine  (PYRIDIUM ) 200 MG tablet Take 1 tablet (200 mg total) by mouth 3 (three) times daily as needed for pain. 6 tablet Stuart Vernell Norris, [place]      PDMP not reviewed this encounter.   Stuart Vernell Norris, [place] 10/15/24 1408

## 2024-10-15 NOTE — ED Triage Notes (Signed)
 Pt reports she has frequent urination, bladder spasm, and bladder pain since this morning

## 2024-10-15 NOTE — Discharge Instructions (Signed)
 Your urine testing today did not show any evidence of a urinary tract infection.  I have sent out for culture just to be sure and we will let you know if this comes back abnormal.  Drink plenty of water, take the Pyridium  as needed for burning and discomfort and follow-up for significantly worsening symptoms.

## 2024-10-17 ENCOUNTER — Ambulatory Visit (HOSPITAL_COMMUNITY): Payer: Self-pay

## 2024-10-17 LAB — URINE CULTURE: Culture: NO GROWTH

## 2024-10-23 ENCOUNTER — Telehealth: Payer: Self-pay

## 2024-10-23 NOTE — Telephone Encounter (Signed)
 Patient's orthotics are here.

## 2024-11-06 ENCOUNTER — Ambulatory Visit: Admitting: Podiatry

## 2024-11-13 ENCOUNTER — Encounter: Payer: Self-pay | Admitting: Physician Assistant

## 2024-11-13 ENCOUNTER — Encounter: Payer: Self-pay | Admitting: Podiatry

## 2024-11-13 ENCOUNTER — Ambulatory Visit: Admitting: Podiatry

## 2024-11-13 VITALS — Ht 66.0 in | Wt 249.0 lb

## 2024-11-13 DIAGNOSIS — M722 Plantar fascial fibromatosis: Secondary | ICD-10-CM

## 2024-11-13 NOTE — Progress Notes (Signed)
" ° °  Chief Complaint  Patient presents with   Plantar Fasciitis    Patient is here for bilateral plantar fasciitis pain has not gotten better    Subjective: 39 y.o. female presenting today for follow-up evaluation of bilateral heel pain ongoing for about 2 years now.  Overall improvement especially with anti-inflammatory meloxicam .  She continues to have some pain and tenderness however   Past Medical History:  Diagnosis Date   Alopecia    per patient    Asthma    Eczema    Hypertension    Insulin  resistance    per patient    Pleurisy    PNA (pneumonia)    Urticaria    Vitiligo    per patient, dx by derm     Objective: Physical Exam General: The patient is alert and oriented x3 in no acute distress.  Dermatology: Skin is warm, dry and supple bilateral lower extremities. Negative for open lesions or macerations bilateral.   Vascular: Dorsalis Pedis and Posterior Tibial pulses palpable bilateral.  Capillary fill time is immediate to all digits.  Neurological: Grossly intact via light touch  Musculoskeletal: There continues to be tenderness to palpation to the plantar aspect of the bilateral heels along the plantar fascia. All other joints range of motion within normal limits bilateral. Strength 5/5 in all groups bilateral.   Radiographic exam B/L feet 10/02/2024: Normal osseous mineralization. Joint spaces preserved. No fracture/dislocation/boney destruction. No other soft tissue abnormalities or radiopaque foreign bodies.   Assessment: 1. plantar fasciitis bilateral feet  Plan of Care:  -Patient evaluated.  -Declined injections today -Continue to refrain from going barefoot.  Recommend good supportive tennis shoes and sneakers - Today orthotics were dispensed.  Break-in instructions provided -Return to clinic 6 weeks  Thresa EMERSON Sar, DPM [facility]  Dr. Thresa EMERSON Sar, DPM    [address] [place], [place]  [address]                Office [phone]  Fax [phone]  "

## 2024-11-27 ENCOUNTER — Other Ambulatory Visit (HOSPITAL_COMMUNITY): Payer: Self-pay

## 2024-11-27 ENCOUNTER — Encounter: Payer: Self-pay | Admitting: Physician Assistant

## 2024-11-27 MED ORDER — TRIAMCINOLONE ACETONIDE 0.1 % EX CREA
1.0000 | TOPICAL_CREAM | Freq: Two times a day (BID) | CUTANEOUS | 0 refills | Status: AC
  Filled 2024-11-27: qty 15, 30d supply, fill #0

## 2024-11-29 ENCOUNTER — Other Ambulatory Visit (HOSPITAL_COMMUNITY): Payer: Self-pay

## 2025-03-27 ENCOUNTER — Ambulatory Visit: Admitting: Physician Assistant
